# Patient Record
Sex: Male | Born: 1950 | Race: Black or African American | Hispanic: No | Smoking: Current every day smoker
Health system: Southern US, Community
[De-identification: ages and names within clinical notes are randomized; demographics above are authoritative.]

## PROBLEM LIST (undated history)

## (undated) DIAGNOSIS — D494 Neoplasm of unspecified behavior of bladder: Secondary | ICD-10-CM

## (undated) DIAGNOSIS — H269 Unspecified cataract: Secondary | ICD-10-CM

## (undated) DIAGNOSIS — S42009A Fracture of unspecified part of unspecified clavicle, initial encounter for closed fracture: Secondary | ICD-10-CM

## (undated) DIAGNOSIS — M1990 Unspecified osteoarthritis, unspecified site: Secondary | ICD-10-CM

## (undated) HISTORY — PX: MULTIPLE TOOTH EXTRACTIONS: SHX2053

---

## 2000-05-27 ENCOUNTER — Emergency Department (HOSPITAL_COMMUNITY): Admission: EM | Admit: 2000-05-27 | Discharge: 2000-05-27 | Payer: Self-pay | Admitting: Emergency Medicine

## 2000-05-28 ENCOUNTER — Encounter: Payer: Self-pay | Admitting: *Deleted

## 2006-10-10 ENCOUNTER — Emergency Department (HOSPITAL_COMMUNITY): Admission: EM | Admit: 2006-10-10 | Discharge: 2006-10-10 | Payer: Self-pay | Admitting: Emergency Medicine

## 2015-10-09 DIAGNOSIS — S42009A Fracture of unspecified part of unspecified clavicle, initial encounter for closed fracture: Secondary | ICD-10-CM

## 2015-10-09 HISTORY — DX: Fracture of unspecified part of unspecified clavicle, initial encounter for closed fracture: S42.009A

## 2016-10-06 ENCOUNTER — Encounter (HOSPITAL_COMMUNITY): Payer: Self-pay | Admitting: Nurse Practitioner

## 2016-10-06 DIAGNOSIS — F17210 Nicotine dependence, cigarettes, uncomplicated: Secondary | ICD-10-CM | POA: Diagnosis not present

## 2016-10-06 DIAGNOSIS — Z79899 Other long term (current) drug therapy: Secondary | ICD-10-CM | POA: Insufficient documentation

## 2016-10-06 DIAGNOSIS — N3090 Cystitis, unspecified without hematuria: Secondary | ICD-10-CM | POA: Insufficient documentation

## 2016-10-06 DIAGNOSIS — R935 Abnormal findings on diagnostic imaging of other abdominal regions, including retroperitoneum: Secondary | ICD-10-CM | POA: Insufficient documentation

## 2016-10-06 DIAGNOSIS — M545 Low back pain: Secondary | ICD-10-CM | POA: Diagnosis present

## 2016-10-06 LAB — URINALYSIS, ROUTINE W REFLEX MICROSCOPIC
Bilirubin Urine: NEGATIVE
Glucose, UA: NEGATIVE mg/dL
Ketones, ur: NEGATIVE mg/dL
Nitrite: NEGATIVE
Protein, ur: 100 mg/dL — AB
Specific Gravity, Urine: 1.015 (ref 1.005–1.030)
pH: 5 (ref 5.0–8.0)

## 2016-10-06 LAB — CBC
HCT: 45.3 % (ref 39.0–52.0)
Hemoglobin: 15.7 g/dL (ref 13.0–17.0)
MCH: 29.4 pg (ref 26.0–34.0)
MCHC: 34.7 g/dL (ref 30.0–36.0)
MCV: 84.8 fL (ref 78.0–100.0)
Platelets: 278 10*3/uL (ref 150–400)
RBC: 5.34 MIL/uL (ref 4.22–5.81)
RDW: 14.9 % (ref 11.5–15.5)
WBC: 13.7 10*3/uL — ABNORMAL HIGH (ref 4.0–10.5)

## 2016-10-06 LAB — BASIC METABOLIC PANEL
Anion gap: 9 (ref 5–15)
BUN: 21 mg/dL — ABNORMAL HIGH (ref 6–20)
CO2: 24 mmol/L (ref 22–32)
Calcium: 9.1 mg/dL (ref 8.9–10.3)
Chloride: 103 mmol/L (ref 101–111)
Creatinine, Ser: 0.97 mg/dL (ref 0.61–1.24)
GFR calc Af Amer: >60 mL/min (ref >60)
GFR calc non Af Amer: >60 mL/min (ref >60)
Glucose, Bld: 116 mg/dL — ABNORMAL HIGH (ref 65–99)
Potassium: 3.8 mmol/L (ref 3.5–5.1)
Sodium: 136 mmol/L (ref 135–145)

## 2016-10-06 NOTE — ED Triage Notes (Signed)
Pt states he noticed blood in his urine since this morning. States it hurts/aches when he urinates. Denies any medical hx, reports lower abd pain/warm feeling

## 2016-10-07 ENCOUNTER — Emergency Department (HOSPITAL_COMMUNITY): Payer: Medicare Other

## 2016-10-07 ENCOUNTER — Emergency Department (HOSPITAL_COMMUNITY)
Admission: EM | Admit: 2016-10-07 | Discharge: 2016-10-07 | Disposition: A | Discharge status: Home / Self Care | Payer: Medicare Other | Attending: Emergency Medicine | Admitting: Emergency Medicine

## 2016-10-07 DIAGNOSIS — N3091 Cystitis, unspecified with hematuria: Secondary | ICD-10-CM

## 2016-10-07 MED ORDER — IBUPROFEN 600 MG PO TABS: 600.0000 mg | ORAL_TABLET | Freq: Four times a day (QID) | ORAL | 0 refills | Status: DC | PRN

## 2016-10-07 MED ORDER — CIPROFLOXACIN HCL 500 MG PO TABS
500.0000 mg | ORAL_TABLET | Freq: Once | ORAL | Status: AC
  Administered 2016-10-07: 500 mg via ORAL
  Filled 2016-10-07: qty 1

## 2016-10-07 MED ORDER — NAPROXEN 500 MG PO TABS
500.0000 mg | ORAL_TABLET | Freq: Once | ORAL | Status: AC
  Administered 2016-10-07: 500 mg via ORAL
  Filled 2016-10-07: qty 1

## 2016-10-07 MED ORDER — CIPROFLOXACIN HCL 500 MG PO TABS: 500.0000 mg | ORAL_TABLET | Freq: Two times a day (BID) | ORAL | 0 refills | Status: DC

## 2016-10-07 MED ORDER — PHENAZOPYRIDINE HCL 200 MG PO TABS: 200.0000 mg | ORAL_TABLET | Freq: Three times a day (TID) | ORAL | 0 refills | Status: DC

## 2016-10-07 NOTE — Discharge Instructions (Signed)
Take ciprofloxacin as prescribed to treat your bladder infection. This will help your bloody urine resolve. Take Pyridium and/or ibuprofen as needed for pain. Follow-up with a primary care doctor to ensure that symptoms resolve.

## 2016-10-07 NOTE — ED Provider Notes (Signed)
[facility] DEPT Provider Note   By signing my name below, I, Bea Graff, attest that this documentation has been prepared under the direction and in the presence of [employer], PA-C. Electronically Signed: Bea Graff, ED Scribe. 10/07/16. 4:34 AM.    History   Chief Complaint Chief Complaint  Patient presents with  . Hematuria    The history is provided by the patient and medical records. No language interpreter was used.    HPI Comments:  Luis House is a 65 y.o. male who presents to the Emergency Department complaining of hematuria that began earlier today. He states he feels as if he cannot empty his bladder completely. He reports associated constant lower right sided back pain. He has not taken anything for pain. He denies modifying factors. He denies fever, chills, nausea, vomiting. He denies h/o nephrolithiasis. He denies anticoagulant use. He denies allergies to any antibiotics.    History reviewed. No pertinent past medical history.  There are no active problems to display for this patient.   History reviewed. No pertinent surgical history.     Home Medications    Prior to Admission medications   Medication Sig Start Date End Date Taking? Authorizing Provider  ciprofloxacin (CIPRO) 500 MG tablet Take 1 tablet (500 mg total) by mouth every 12 (twelve) hours. 10/07/16   Antonietta Breach, PA-C  ibuprofen (ADVIL,MOTRIN) 600 MG tablet Take 1 tablet (600 mg total) by mouth every 6 (six) hours as needed. 10/07/16   Antonietta Breach, PA-C  phenazopyridine (PYRIDIUM) 200 MG tablet Take 1 tablet (200 mg total) by mouth 3 (three) times daily. 10/07/16   Antonietta Breach, PA-C    Family History History reviewed. No pertinent family history.  Social History Social History  Substance Use Topics  . Smoking status: Current Every Day Smoker    Types: Cigarettes  . Smokeless tobacco: Never Used  . Alcohol use 2.4 oz/week    4 Cans of beer per week     Allergies     Patient has no known allergies.   Review of Systems Review of Systems A complete 10 system review of systems was obtained and all systems are negative except as noted in the HPI and PMH.    Physical Exam Updated Vital Signs BP (!) 172/105 (BP Location: Left Arm)   Pulse 78   Temp 98.2 F (36.8 C) (Oral)   Resp 18   Ht 5\' 7"  (1.702 m)   Wt 179 lb (81.2 kg)   SpO2 100%   BMI 28.04 kg/m   Physical Exam  Constitutional: He is oriented to person, place, and time. He appears well-developed and well-nourished. No distress.  Nontoxic and in no acute distress  HENT:  Head: Normocephalic and atraumatic.  Eyes: Conjunctivae and EOM are normal. No scleral icterus.  Neck: Normal range of motion.  Cardiovascular: Normal rate, regular rhythm and intact distal pulses.   Pulmonary/Chest: Effort normal. No respiratory distress. He has no wheezes.  Respirations even and unlabored  Abdominal: Soft. He exhibits no mass. There is no tenderness. There is no guarding.  Soft, nontender abdomen. No masses or peritoneal signs.  Musculoskeletal: Normal range of motion.  Neurological: He is alert and oriented to person, place, and time. He exhibits normal muscle tone. Coordination normal.  Skin: Skin is warm and dry. No rash noted. He is not diaphoretic. No erythema. No pallor.  Psychiatric: He has a normal mood and affect. His behavior is normal.  Nursing note and vitals reviewed.  ED Treatments / Results  DIAGNOSTIC STUDIES: Oxygen Saturation is 100% on RA, normal by my interpretation.   COORDINATION OF CARE: 4:34 AM- Will order imaging. Pt verbalizes understanding and agrees to plan.  Medications  ciprofloxacin (CIPRO) tablet 500 mg (500 mg Oral Given 10/07/16 0523)  naproxen (NAPROSYN) tablet 500 mg (500 mg Oral Given 10/07/16 0522)    Labs (all labs ordered are listed, but only abnormal results are displayed) Labs Reviewed  URINALYSIS, ROUTINE W REFLEX MICROSCOPIC - Abnormal;  Notable for the following:       Result Value   APPearance HAZY (*)    Hgb urine dipstick LARGE (*)    Protein, ur 100 (*)    Leukocytes, UA SMALL (*)    Bacteria, UA RARE (*)    Squamous Epithelial / LPF 0-5 (*)    All other components within normal limits  BASIC METABOLIC PANEL - Abnormal; Notable for the following:    Glucose, Bld 116 (*)    BUN 21 (*)    All other components within normal limits  CBC - Abnormal; Notable for the following:    WBC 13.7 (*)    All other components within normal limits  URINE CULTURE    EKG  EKG Interpretation None       Radiology Ct Renal Stone Study  Result Date: 10/07/2016 CLINICAL DATA:  Right flank pain and hematuria. EXAM: CT ABDOMEN AND PELVIS WITHOUT CONTRAST TECHNIQUE: Multidetector CT imaging of the abdomen and pelvis was performed following the standard protocol without IV contrast. COMPARISON:  None. FINDINGS: Lower chest: No consolidation or pleural fluid. Left diaphragmatic defect with Bochdalek hernia. Hepatobiliary: No focal liver abnormality is seen. No gallstones, gallbladder wall thickening, or biliary dilatation. Pancreas: No ductal dilatation or inflammation. Spleen: Normal in size without focal abnormality. Adrenals/Urinary Tract: No adrenal nodule. No renal stones or hydronephrosis. No significant perinephric edema. The ureters are decompressed without stones along the course. Small cyst in the lower left kidney is incompletely characterized without contrast. Urinary bladder is minimally distended, questionable bladder wall thickening. Stomach/Bowel: Small hiatal hernia. Stomach is physiologically distended. No bowel wall thickening or inflammation. Normal appendix. Moderate stool burden throughout the colon without colonic wall thickening. Vascular/Lymphatic: Aortic atherosclerosis. No enlarged abdominal or pelvic lymph nodes. Reproductive: Prostate is unremarkable. Other: Minimal fat within both inguinal canals. No free air,  free fluid, or intra-abdominal fluid collection. Musculoskeletal: There are no acute or suspicious osseous abnormalities. Large anterior osteophytes throughout the lumbar spine. Degenerative change of both hips, right greater than left. IMPRESSION: No renal stones or obstructive uropathy. Equivocal bladder wall thickening, can be seen with urinary tract infection/cystitis. Abdominal aortic atherosclerosis without aneurysm. Electronically Signed   By: Jeb Levering M.D.   On: 10/07/2016 05:42    Procedures Procedures (including critical care time)  Medications Ordered in ED Medications  ciprofloxacin (CIPRO) tablet 500 mg (500 mg Oral Given 10/07/16 0523)  naproxen (NAPROSYN) tablet 500 mg (500 mg Oral Given 10/07/16 0522)     Initial Impression / Assessment and Plan / ED Course  I have reviewed the triage vital signs and the nursing notes.  Pertinent labs & imaging results that were available during my care of the patient were reviewed by me and considered in my medical decision making (see chart for details).  Clinical Course     65 year old male presents to the emergency department for evaluation of hematuria with associated urinary urgency and frequency. He also reports sensation of incomplete bladder emptying.  No evidence of kidney stone on CT scan. Findings consistent with hemorrhagic cystitis. Urine sent for culture. Will start on ciprofloxacin. Patient advised to follow-up with a primary care doctor to ensure resolution of symptoms. Return precautions discussed and provided. Patient discharged in stable condition.   Final Clinical Impressions(s) / ED Diagnoses   Final diagnoses:  Hemorrhagic cystitis    New Prescriptions New Prescriptions   CIPROFLOXACIN (CIPRO) 500 MG TABLET    Take 1 tablet (500 mg total) by mouth every 12 (twelve) hours.   IBUPROFEN (ADVIL,MOTRIN) 600 MG TABLET    Take 1 tablet (600 mg total) by mouth every 6 (six) hours as needed.   PHENAZOPYRIDINE  (PYRIDIUM) 200 MG TABLET    Take 1 tablet (200 mg total) by mouth 3 (three) times daily.    I personally performed the services described in this documentation, which was scribed in my presence. The recorded information has been reviewed and is accurate.      Antonietta Breach, PA-C 10/07/16 [id number]    Everlene Balls, MD 10/07/16 [phone]

## 2016-10-08 LAB — URINE CULTURE: Culture: NO GROWTH

## 2017-09-23 ENCOUNTER — Emergency Department (HOSPITAL_COMMUNITY)
Admission: EM | Admit: 2017-09-23 | Discharge: 2017-09-23 | Disposition: A | Discharge status: Home / Self Care | Payer: Medicare (Managed Care) | Attending: Emergency Medicine | Admitting: Emergency Medicine

## 2017-09-23 ENCOUNTER — Encounter (HOSPITAL_COMMUNITY): Payer: Self-pay

## 2017-09-23 ENCOUNTER — Emergency Department (HOSPITAL_COMMUNITY): Payer: Medicare (Managed Care)

## 2017-09-23 DIAGNOSIS — M25561 Pain in right knee: Secondary | ICD-10-CM | POA: Diagnosis present

## 2017-09-23 DIAGNOSIS — M25461 Effusion, right knee: Secondary | ICD-10-CM | POA: Diagnosis not present

## 2017-09-23 DIAGNOSIS — F17210 Nicotine dependence, cigarettes, uncomplicated: Secondary | ICD-10-CM | POA: Insufficient documentation

## 2017-09-23 DIAGNOSIS — Z79899 Other long term (current) drug therapy: Secondary | ICD-10-CM | POA: Diagnosis not present

## 2017-09-23 MED ORDER — NAPROXEN 500 MG PO TABS: 500.0000 mg | ORAL_TABLET | Freq: Two times a day (BID) | ORAL | 0 refills | Status: DC

## 2017-09-23 NOTE — Discharge Instructions (Signed)
Take naproxen 2 times a day with meals.  Do not take other anti-inflammatories at the same time open (Advil, Motrin, ibuprofen, Aleve). You may supplement with Tylenol if you need further pain control. Wear the knee sleeve when walking and during the day for comfort. Elevate your leg whenever possible. Use ice packs to help control your pain. Use the ice 20 minutes at a time 4 times a day.  Follow-up with orthopedic doctor for further evaluation and management of your knee. Return to the emergency room if you develop fevers, knee becomes very red, numbness of your foot, or any new or worsening symptoms.

## 2017-09-23 NOTE — ED Triage Notes (Signed)
Pt states that yesterday began to have pain and swelling. Denies injury or fevers.

## 2017-09-23 NOTE — ED Notes (Signed)
Pt departed in NAD.  

## 2017-09-24 NOTE — ED Provider Notes (Signed)
[facility] EMERGENCY DEPARTMENT Provider Note   CSN: [id number] Arrival date & time: 09/23/17  1949     History   Chief Complaint Chief Complaint  Patient presents with  . Knee Pain    HPI Luis House is a 66 y.o. male presenting for evaluation of right knee pain.  Patient states he has been having issues with this knee for several months.  Usually when he has pain, it resolves in 30 minutes.  This morning, his pain did not resolve.  Patient reports pain of the lateral aspect of the right knee, worse with palpation and movement.  He has not tried anything for his pain.  Nothing makes it better.  He denies numbness or tingling.  He denies fall, trauma, or injury.  He is not on blood thinners.  Pain does not radiate.  It is described as a throbbing.   HPI  History reviewed. No pertinent past medical history.  There are no active problems to display for this patient.   History reviewed. No pertinent surgical history.     Home Medications    Prior to Admission medications   Medication Sig Start Date End Date Taking? Authorizing Provider  ciprofloxacin (CIPRO) 500 MG tablet Take 1 tablet (500 mg total) by mouth every 12 (twelve) hours. 10/07/16   Antonietta Breach, PA-C  ibuprofen (ADVIL,MOTRIN) 600 MG tablet Take 1 tablet (600 mg total) by mouth every 6 (six) hours as needed. 10/07/16   Antonietta Breach, PA-C  naproxen (NAPROSYN) 500 MG tablet Take 1 tablet (500 mg total) by mouth 2 (two) times daily with a meal. 09/23/17   Zaki Gertsch, PA-C  phenazopyridine (PYRIDIUM) 200 MG tablet Take 1 tablet (200 mg total) by mouth 3 (three) times daily. 10/07/16   Antonietta Breach, PA-C    Family History No family history on file.  Social History Social History   Tobacco Use  . Smoking status: Current Every Day Smoker    Types: Cigarettes  . Smokeless tobacco: Never Used  Substance Use Topics  . Alcohol use: Yes    Alcohol/week: 2.4 oz    Types: 4 Cans of beer  per week  . Drug use: Not on file     Allergies   Patient has no known allergies.   Review of Systems Review of Systems  Musculoskeletal: Positive for arthralgias and joint swelling.  Neurological: Negative for numbness.  Hematological: Does not bruise/bleed easily.     Physical Exam Updated Vital Signs BP (!) 156/89 (BP Location: Right Arm)   Pulse 79   Temp 98.8 F (37.1 C) (Oral)   Resp 16   SpO2 100%   Physical Exam  Constitutional: He is oriented to person, place, and time. He appears well-developed and well-nourished. No distress.  HENT:  Head: Normocephalic and atraumatic.  Eyes: EOM are normal.  Neck: Normal range of motion.  Pulmonary/Chest: Effort normal.  Abdominal: He exhibits no distension.  Musculoskeletal: He exhibits edema and tenderness.  Mild swelling of the right knee.  Tenderness palpation of lateral aspect.  No obvious laceration, contusion, or injury.  No crepitus or deformity.  Popliteal pulses intact bilaterally.  Sensation intact bilaterally.  Strength intact bilaterally.  Patient is ambulatory.  Soft compartments.  Neurological: He is alert and oriented to person, place, and time. No sensory deficit.  Skin: Skin is warm. No rash noted.  Psychiatric: He has a normal mood and affect.  Nursing note and vitals reviewed.    ED Treatments / Results  Labs (all labs ordered are listed, but only abnormal results are displayed) Labs Reviewed - No data to display  EKG  EKG Interpretation None       Radiology Dg Knee Complete 4 Views Right  Result Date: 09/23/2017 CLINICAL DATA:  Knee pain EXAM: RIGHT KNEE - COMPLETE 4+ VIEW COMPARISON:  None. FINDINGS: No fracture or dislocation is evident. Moderate patellofemoral degenerative change with narrowing and prominent osteophyte. Moderate to marked arthritis of the medial compartment of the knee with subchondral sclerosis and spurring. Mild degenerative changes of the lateral compartment. Knee  fusion. Large oval coarse calcifications superior to the patella. IMPRESSION: 1. No acute osseous abnormality 2. 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 3. 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 Electronically Signed   By: Donavan Foil M.D.   On: 09/23/2017 21:27    Procedures Procedures (including critical care time)  Medications Ordered in ED Medications - No data to display   Initial Impression / Assessment and Plan / ED Course  I have reviewed the triage vital signs and the nursing notes.  Pertinent labs & imaging results that were available during my care of the patient were reviewed by me and considered in my medical decision making (see chart for details).     Pt presenting for evaluation of right knee pain and swelling.  Physical exam reassuring, patient is neurovascularly intact.  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  Will apply knee sleeve, give instructions on NSAID use, and follow-up with orthopedics.  At this time, patient appears safe for discharge.  Return precautions given.  Patient states he understands and agrees to plan.   Final Clinical Impressions(s) / ED Diagnoses   Final diagnoses:  Effusion of right knee  Acute pain of right knee    ED Discharge Orders        Ordered    naproxen (NAPROSYN) 500 MG tablet  2 times daily with meals     09/23/17 2304       Seletha Zimmermann, PA-C 09/24/17 0106    Deno Etienne, DO 09/24/17 1459

## 2018-11-22 ENCOUNTER — Other Ambulatory Visit: Payer: Self-pay

## 2018-11-22 ENCOUNTER — Encounter (HOSPITAL_COMMUNITY): Payer: Self-pay | Admitting: Emergency Medicine

## 2018-11-22 ENCOUNTER — Emergency Department (HOSPITAL_COMMUNITY)
Admission: EM | Admit: 2018-11-22 | Discharge: 2018-11-22 | Disposition: A | Discharge status: Home / Self Care | Payer: Medicare (Managed Care) | Attending: Emergency Medicine | Admitting: Emergency Medicine

## 2018-11-22 DIAGNOSIS — R319 Hematuria, unspecified: Secondary | ICD-10-CM | POA: Insufficient documentation

## 2018-11-22 DIAGNOSIS — F17210 Nicotine dependence, cigarettes, uncomplicated: Secondary | ICD-10-CM | POA: Diagnosis not present

## 2018-11-22 LAB — URINALYSIS, ROUTINE W REFLEX MICROSCOPIC
Bilirubin Urine: NEGATIVE
GLUCOSE, UA: NEGATIVE mg/dL
Ketones, ur: NEGATIVE mg/dL
Nitrite: NEGATIVE
PH: 7 (ref 5.0–8.0)
PROTEIN: NEGATIVE mg/dL
Specific Gravity, Urine: 1.011 (ref 1.005–1.030)

## 2018-11-22 NOTE — ED Provider Notes (Signed)
[facility] EMERGENCY DEPARTMENT Provider Note   CSN: [id number] Arrival date & time: 11/22/18  1214     History   Chief Complaint Chief Complaint  Patient presents with  . Hematuria    HPI Barlow Luis House is a 68 y.o. male.  The history is provided by the patient.  Hematuria  This is a new problem. The current episode started yesterday. The problem occurs constantly. The problem has been gradually improving. Pertinent negatives include no chest pain, no abdominal pain, no headaches and no shortness of breath. Nothing aggravates the symptoms. Nothing relieves the symptoms. He has tried nothing for the symptoms. The treatment provided no relief.    History reviewed. No pertinent past medical history.  There are no active problems to display for this patient.   History reviewed. No pertinent surgical history.      Home Medications    Prior to Admission medications   Medication Sig Start Date End Date Taking? Authorizing Provider  ciprofloxacin (CIPRO) 500 MG tablet Take 1 tablet (500 mg total) by mouth every 12 (twelve) hours. Patient not taking: Reported on 11/22/2018 10/07/16   Antonietta Breach, PA-C  ibuprofen (ADVIL,MOTRIN) 600 MG tablet Take 1 tablet (600 mg total) by mouth every 6 (six) hours as needed. Patient not taking: Reported on 11/22/2018 10/07/16   Antonietta Breach, PA-C  naproxen (NAPROSYN) 500 MG tablet Take 1 tablet (500 mg total) by mouth 2 (two) times daily with a meal. Patient not taking: Reported on 11/22/2018 09/23/17   Caccavale, Sophia, PA-C  phenazopyridine (PYRIDIUM) 200 MG tablet Take 1 tablet (200 mg total) by mouth 3 (three) times daily. Patient not taking: Reported on 11/22/2018 10/07/16   Antonietta Breach, PA-C    Family History No family history on file.  Social History Social History   Tobacco Use  . Smoking status: Current Every Day Smoker    Types: Cigarettes  . Smokeless tobacco: Never Used  Substance Use Topics  . Alcohol  use: Yes    Alcohol/week: 4.0 standard drinks    Types: 4 Cans of beer per week  . Drug use: Not on file     Allergies   Patient has no known allergies.   Review of Systems Review of Systems  Constitutional: Negative for chills and fever.  HENT: Negative for ear pain and sore throat.   Eyes: Negative for pain and visual disturbance.  Respiratory: Negative for cough and shortness of breath.   Cardiovascular: Negative for chest pain and palpitations.  Gastrointestinal: Negative for abdominal pain and vomiting.  Genitourinary: Positive for hematuria. Negative for decreased urine volume, difficulty urinating, discharge, dysuria, enuresis, flank pain, frequency, genital sores, penile pain, penile swelling, scrotal swelling, testicular pain and urgency.  Musculoskeletal: Negative for arthralgias and back pain.  Skin: Negative for color change and rash.  Neurological: Negative for seizures, syncope and headaches.  All other systems reviewed and are negative.    Physical Exam Updated Vital Signs  ED Triage Vitals  Enc Vitals Group     BP 11/22/18 1228 (!) 138/111     Pulse Rate 11/22/18 1228 78     Resp 11/22/18 1228 12     Temp 11/22/18 1228 98.7 F (37.1 C)     Temp Source 11/22/18 1228 Oral     SpO2 11/22/18 1228 98 %     Weight 11/22/18 1225 179 lb (81.2 kg)     Height 11/22/18 1225 5\' 7"  (1.702 m)     Head Circumference --  Peak Flow --      Pain Score 11/22/18 1225 9     Pain Loc --      Pain Edu? --      Excl. in [place]? --     Physical Exam Vitals signs and nursing note reviewed.  Constitutional:      Appearance: He is well-developed.  HENT:     Head: Normocephalic and atraumatic.     Mouth/Throat:     Mouth: Mucous membranes are moist.  Eyes:     Extraocular Movements: Extraocular movements intact.     Conjunctiva/sclera: Conjunctivae normal.     Pupils: Pupils are equal, round, and reactive to light.  Neck:     Musculoskeletal: Neck supple.    Cardiovascular:     Rate and Rhythm: Normal rate and regular rhythm.     Pulses: Normal pulses.     Heart sounds: Normal heart sounds. No murmur.  Pulmonary:     Effort: Pulmonary effort is normal. No respiratory distress.     Breath sounds: Normal breath sounds.  Abdominal:     General: Abdomen is flat. There is no distension.     Palpations: Abdomen is soft. There is no mass.     Tenderness: There is no abdominal tenderness. There is no right CVA tenderness, left CVA tenderness, guarding or rebound.     Hernia: No hernia is present.  Skin:    General: Skin is warm and dry.     Capillary Refill: Capillary refill takes less than 2 seconds.  Neurological:     Mental Status: He is alert.      ED Treatments / Results  Labs (all labs ordered are listed, but only abnormal results are displayed) Labs Reviewed  URINALYSIS, ROUTINE W REFLEX MICROSCOPIC - Abnormal; Notable for the following components:      Result Value   Hgb urine dipstick LARGE (*)    Leukocytes,Ua TRACE (*)    RBC / HPF >50 (*)    Bacteria, UA RARE (*)    Non Squamous Epithelial 0-5 (*)    All other components within normal limits  URINE CULTURE    EKG None  Radiology No results found.  Procedures Procedures (including critical care time)  Medications Ordered in ED Medications - No data to display   Initial Impression / Assessment and Plan / ED Course  I have reviewed the triage vital signs and the nursing notes.  Pertinent labs & imaging results that were available during my care of the patient were reviewed by me and considered in my medical decision making (see chart for details).     Luis House is a 68 year old male with no significant medical history who presents to the ED with hematuria.  Patient with overall normal vitals.  No fever.  Patient is not on blood thinners.  Patient with blood-tinged urine over the last 2 days.  Does not have any abdominal pain.  No history of kidney stones.   Was initially passing some clots but has now resolved.  Patient had bladder scan that showed less than 50 cc of urine.  He states that he feels like he is completely voiding.  Urinalysis showed no signs of infection.  Mostly contamination and will send for culture.  We will hold off on any treatment at this time.  Given painless hematuria and smoking history recommend that he follow-up with urology.  States that this has occurred before in the past but he never followed up with urology.  Given  return precautions and recommend that he follow-up with his primary care doctor to have them follow-up urine culture.  Patient given information for urology follow-up and given return precautions including if he is unable to void to return for possible Foley catheter.  This chart was dictated using voice recognition software.  Despite best efforts to proofread,  errors can occur which can change the documentation meaning.    Final Clinical Impressions(s) / ED Diagnoses   Final diagnoses:  Hematuria, unspecified type    ED Discharge Orders    None       Lennice Sites, DO 11/22/18 1722

## 2018-11-22 NOTE — ED Triage Notes (Signed)
Pt has been having burning with urination x 2 days and hematuria. Denies fevers or other symptoms

## 2018-11-22 NOTE — Discharge Instructions (Addendum)
Return to ED if unable to void

## 2018-11-23 LAB — URINE CULTURE: CULTURE: NO GROWTH

## 2018-12-01 ENCOUNTER — Emergency Department (HOSPITAL_COMMUNITY)
Admission: EM | Admit: 2018-12-01 | Discharge: 2018-12-01 | Disposition: A | Discharge status: Home / Self Care | Payer: Medicare (Managed Care) | Attending: Emergency Medicine | Admitting: Emergency Medicine

## 2018-12-01 ENCOUNTER — Encounter (HOSPITAL_COMMUNITY): Payer: Self-pay

## 2018-12-01 ENCOUNTER — Emergency Department (HOSPITAL_COMMUNITY): Payer: Medicare (Managed Care)

## 2018-12-01 ENCOUNTER — Other Ambulatory Visit: Payer: Self-pay

## 2018-12-01 DIAGNOSIS — R310 Gross hematuria: Secondary | ICD-10-CM | POA: Insufficient documentation

## 2018-12-01 DIAGNOSIS — R319 Hematuria, unspecified: Secondary | ICD-10-CM | POA: Diagnosis present

## 2018-12-01 DIAGNOSIS — F17210 Nicotine dependence, cigarettes, uncomplicated: Secondary | ICD-10-CM | POA: Insufficient documentation

## 2018-12-01 LAB — URINALYSIS, ROUTINE W REFLEX MICROSCOPIC
BACTERIA UA: NONE SEEN
Bilirubin Urine: NEGATIVE
Glucose, UA: NEGATIVE mg/dL
Ketones, ur: NEGATIVE mg/dL
Nitrite: NEGATIVE
PROTEIN: 30 mg/dL — AB
RBC / HPF: >50 RBC/hpf — ABNORMAL HIGH (ref 0–5)
SPECIFIC GRAVITY, URINE: 1.016 (ref 1.005–1.030)
pH: 6 (ref 5.0–8.0)

## 2018-12-01 LAB — CBC WITH DIFFERENTIAL/PLATELET
ABS IMMATURE GRANULOCYTES: 0.08 10*3/uL — AB (ref 0.00–0.07)
BASOS ABS: 0.1 10*3/uL (ref 0.0–0.1)
Basophils Relative: 1 %
Eosinophils Absolute: 0.2 10*3/uL (ref 0.0–0.5)
Eosinophils Relative: 1 %
HCT: 47.8 % (ref 39.0–52.0)
HEMOGLOBIN: 14.9 g/dL (ref 13.0–17.0)
Immature Granulocytes: 1 %
LYMPHS PCT: 22 %
Lymphs Abs: 2.7 10*3/uL (ref 0.7–4.0)
MCH: 28.3 pg (ref 26.0–34.0)
MCHC: 31.2 g/dL (ref 30.0–36.0)
MCV: 90.9 fL (ref 80.0–100.0)
Monocytes Absolute: 1 10*3/uL (ref 0.1–1.0)
Monocytes Relative: 8 %
NRBC: 0 % (ref 0.0–0.2)
Neutro Abs: 8.2 10*3/uL — ABNORMAL HIGH (ref 1.7–7.7)
Neutrophils Relative %: 67 %
Platelets: 288 10*3/uL (ref 150–400)
RBC: 5.26 MIL/uL (ref 4.22–5.81)
RDW: 13.8 % (ref 11.5–15.5)
WBC: 12.2 10*3/uL — AB (ref 4.0–10.5)

## 2018-12-01 LAB — BASIC METABOLIC PANEL
Anion gap: 5 (ref 5–15)
BUN: 19 mg/dL (ref 8–23)
CHLORIDE: 109 mmol/L (ref 98–111)
CO2: 27 mmol/L (ref 22–32)
Calcium: 8.9 mg/dL (ref 8.9–10.3)
Creatinine, Ser: 1.2 mg/dL (ref 0.61–1.24)
GFR calc non Af Amer: >60 mL/min (ref >60)
Glucose, Bld: 87 mg/dL (ref 70–99)
POTASSIUM: 5.3 mmol/L — AB (ref 3.5–5.1)
SODIUM: 141 mmol/L (ref 135–145)

## 2018-12-01 NOTE — Discharge Instructions (Addendum)
Follow up with Urology to further workup of your hematuria (blood in urine). Return to ER for worsening or concerning symptoms. A urine culture was sent out today to check for infection in your urine.

## 2018-12-01 NOTE — ED Triage Notes (Signed)
Pt states that he has had blood in his urine x 2 days. Pt had similar issue 11/22/18. Pt also c/o lower abd pain.

## 2018-12-01 NOTE — ED Provider Notes (Signed)
[facility] DEPT Provider Note   CSN: [id number] Arrival date & time: 12/01/18  1146    History   Chief Complaint Chief Complaint  Patient presents with  . Hematuria    HPI [facility] is a 68 y.o. male.     68 year old male presents with complaint of blood in his urine, described as pink-tinged urine intermittent x4 days.  Patient states he was seen in the emergency room 9 days ago for same and symptoms resolved and now have returned.  Patient reports he was also seen for this 2 years ago and symptoms resolved with antibiotics.  Patient reports painful and frequent urination as well as diffuse mild abdominal aching.  Denies nausea, vomiting, changes in bowel habits, denies testicular pain or swelling.  Patient is not on blood thinners.  No other complaints or concerns.     History reviewed. No pertinent past medical history.  There are no active problems to display for this patient.   History reviewed. No pertinent surgical history.      Home Medications    Prior to Admission medications   Not on File    Family History No family history on file.  Social History Social History   Tobacco Use  . Smoking status: Current Every Day Smoker    Types: Cigarettes  . Smokeless tobacco: Never Used  Substance Use Topics  . Alcohol use: Yes    Alcohol/week: 4.0 standard drinks    Types: 4 Cans of beer per week  . Drug use: Not on file     Allergies   Patient has no known allergies.   Review of Systems Review of Systems  Constitutional: Negative for chills and fever.  Respiratory: Negative for shortness of breath.   Cardiovascular: Negative for chest pain.  Gastrointestinal: Positive for abdominal pain. Negative for blood in stool, constipation, diarrhea, nausea and vomiting.  Genitourinary: Positive for frequency and hematuria. Negative for difficulty urinating, scrotal swelling and testicular pain.  Musculoskeletal: Negative for  arthralgias and myalgias.  Skin: Negative for rash and wound.  Allergic/Immunologic: Negative for immunocompromised state.  Hematological: Does not bruise/bleed easily.  Psychiatric/Behavioral: Negative for confusion.  All other systems reviewed and are negative.    Physical Exam Updated Vital Signs BP (!) 161/98   Pulse 76   Temp 98.2 F (36.8 C) (Oral)   Resp 18   Ht 5\' 7"  (1.702 m)   Wt 81.2 kg   SpO2 96%   BMI 28.04 kg/m   Physical Exam Vitals signs and nursing note reviewed.  Constitutional:      General: He is not in acute distress.    Appearance: He is well-developed. He is not diaphoretic.  HENT:     Head: Normocephalic and atraumatic.  Cardiovascular:     Rate and Rhythm: Normal rate and regular rhythm.     Pulses: Normal pulses.     Heart sounds: Normal heart sounds. No friction rub.  Pulmonary:     Effort: Pulmonary effort is normal.     Breath sounds: Normal breath sounds.  Abdominal:     General: Abdomen is flat.     Tenderness: There is generalized abdominal tenderness. There is no right CVA tenderness or left CVA tenderness.  Skin:    General: Skin is warm and dry.     Findings: No erythema or rash.  Neurological:     Mental Status: He is alert and oriented to person, place, and time.  Psychiatric:  Behavior: Behavior normal.      ED Treatments / Results  Labs (all labs ordered are listed, but only abnormal results are displayed) Labs Reviewed  URINALYSIS, ROUTINE W REFLEX MICROSCOPIC - Abnormal; Notable for the following components:      Result Value   APPearance HAZY (*)    Hgb urine dipstick LARGE (*)    Protein, ur 30 (*)    Leukocytes,Ua TRACE (*)    RBC / HPF >50 (*)    All other components within normal limits  CBC WITH DIFFERENTIAL/PLATELET - Abnormal; Notable for the following components:   WBC 12.2 (*)    Neutro Abs 8.2 (*)    Abs Immature Granulocytes 0.08 (*)    All other components within normal limits  BASIC  METABOLIC PANEL - Abnormal; Notable for the following components:   Potassium 5.3 (*)    All other components within normal limits  URINE CULTURE    EKG None  Radiology Ct Renal Stone Study  Result Date: 12/01/2018 CLINICAL DATA:  Blood in urine for 2 days. Bilateral lower abdominal pain. EXAM: CT ABDOMEN AND PELVIS WITHOUT CONTRAST TECHNIQUE: Multidetector CT imaging of the abdomen and pelvis was performed following the standard protocol without IV contrast. COMPARISON:  CT abdomen dated 10/07/2016. FINDINGS: Lower chest: No acute findings. Hepatobiliary: No focal liver abnormality is seen. No gallstones, gallbladder wall thickening, or biliary dilatation. Pancreas: Unremarkable. No pancreatic ductal dilatation or surrounding inflammatory changes. Spleen: Normal in size without focal abnormality. Adrenals/Urinary Tract: Adrenal glands appear normal. Kidneys are unremarkable without mass, stone or hydronephrosis. No ureteral or bladder calculi identified. Bladder is decompressed limiting characterization of its walls. Stomach/Bowel: No dilated large or small bowel loops. No evidence of bowel wall inflammation. Appendix is normal. Stomach is unremarkable, partially decompressed. Vascular/Lymphatic: Aortic atherosclerosis. No enlarged lymph nodes seen in the abdomen or pelvis. Reproductive: Prostate is unremarkable. Other: No free fluid or abscess collection. No free intraperitoneal air. Musculoskeletal: No acute or suspicious osseous finding. Degenerative change at the bilateral hip joints, RIGHT greater than LEFT, at least moderate in degree. Additional degenerative changes throughout the thoracolumbar spine, moderate in degree. IMPRESSION: 1. No acute findings within the abdomen or pelvis. No renal or ureteral calculi. No bowel obstruction or evidence of bowel wall inflammation. Appendix is normal. 2. Bladder is decompressed limiting characterization. 3. Degenerative changes of the bilateral hip joints  and spine, as detailed above. Aortic Atherosclerosis (ICD10-I70.0). Electronically Signed   By: Franki Cabot M.D.   On: 12/01/2018 16:25    Procedures Procedures (including critical care time)  Medications Ordered in ED Medications - No data to display   Initial Impression / Assessment and Plan / ED Course  I have reviewed the triage vital signs and the nursing notes.  Pertinent labs & imaging results that were available during my care of the patient were reviewed by me and considered in my medical decision making (see chart for details).  Clinical Course as of Dec 01 1704  Mon Dec 01, 2860  [id number] 68 year old male presents with complaint of blood in his urine described as pink appearing urine with urinary frequency and abdominal pain.  Patient was seen in the emergency room 9 days ago with hematuria with a negative culture and states his symptoms had resolved and now are back.  Patient was scheduled to see urology however missed his appointment and needs to reschedule.  On exam patient has mild generalized abdominal tenderness, is otherwise well-appearing.  Urine shows large hemoglobin, protein,  trace leukocytes with greater than 50 red blood cells and 12-50 white blood cells.  Urinalysis appears similar to urinalysis from 9 days ago which did not have any growth on culture.  Urine will be sent for culture again, advised patient to follow-up with urology for further evaluation of hematuria which may be related to bladder cancer which would require cystoscopy which cannot be done in the emergency room.  Patient verbalizes understanding of his discharge instructions and plan.   [LM]  1706 CT completed due to report of abdominal pain and does not show source for patient's hematuria today.  BMP with potassium of 5.3 however specimen is hemolyzed.  CBC with white count 12,000.   [LM]    Clinical Course User Index [LM] Tacy Learn, PA-C   Final Clinical Impressions(s) / ED Diagnoses   Final  diagnoses:  Gross hematuria    ED Discharge Orders    None       Roque Lias 12/01/18 1706    Hayden Rasmussen, MD 12/02/18 1050

## 2018-12-03 LAB — URINE CULTURE: Culture: NO GROWTH

## 2019-01-05 ENCOUNTER — Other Ambulatory Visit: Payer: Self-pay | Admitting: Urology

## 2019-01-05 MED ORDER — GEMCITABINE CHEMO FOR BLADDER INSTILLATION 2000 MG: 2000.0000 mg | Freq: Once | INTRAVENOUS | Status: DC

## 2019-01-05 NOTE — Patient Instructions (Addendum)
[facility]    Your procedure is scheduled on:01/09/2019    Report to [facility] Main  House  Report to admitting at 7:00 AM    Call this number if you have problems the morning of surgery [phone]    Remember: Do not eat food or drink liquids :After Midnight. BRUSH YOUR TEETH MORNING OF SURGERY AND RINSE YOUR MOUTH OUT, NO CHEWING GUM CANDY OR MINTS.     Take these medicines the morning of surgery with A SIP OF WATER: none                                You may not have any metal on your body              piercings  Do not wear jewelry,, lotions, powders or , deodorant             Do not shave  48 hours prior to surgery.              Men may shave face and neck.   Do not bring valuables to the hospital. [facility].  Contacts, dentures or bridgework may not be worn into surgery.     Patients discharged the day of surgery will not be allowed to drive home.   IF YOU ARE HAVING SURGERY AND GOING HOME THE SAME DAY, YOU MUST HAVE AN ADULT TO DRIVE YOU HOME AND BE WITH YOU FOR 24 HOURS.  YOU MAY GO HOME BY TAXI OR UBER OR ORTHERWISE, BUT AN ADULT MUST ACCOMPANY YOU HOME AND STAY WITH YOU FOR 24 HOURS.  Name and phone number of your driver:[facility] daughter cell [phone]  Special Instructions: N/A              Please read over the following fact sheets you were given: _____________________________________________________________________             [facility] - Preparing for Surgery Before surgery, you can play an important role.   Because skin is not sterile, your skin needs to be as free of germs as possible.  You can reduce the number of germs on your skin by washing with CHG (chlorahexidine gluconate) soap before surgery.  CHG is an antiseptic cleaner which kills germs and bonds with the skin to continue killing germs even after washing. Please DO NOT use if you have an allergy to CHG  or antibacterial soaps.  If your skin becomes reddened/irritated stop using the CHG and inform your nurse when you arrive at Short Stay.  You may shave your face/neck. Please follow these instructions carefully:  1.  Shower with CHG Soap the night before surgery and the  morning of Surgery.  2.  If you choose to wash your hair, wash your hair first as usual with your  normal  shampoo.  3.  After you shampoo, rinse your hair and body thoroughly to remove the  shampoo.                        4.  Use CHG as you would any other liquid soap.  You can apply chg directly  to the skin and wash  Gently with a scrungie or clean washcloth.  5.  Apply the CHG Soap to your body ONLY FROM THE NECK DOWN.   Do not use on face/ open                           Wound or open sores. Avoid contact with eyes, ears mouth and genitals (private parts).                       Wash face,  Genitals (private parts) with your normal soap.             6.  Wash thoroughly, paying special attention to the area where your surgery  will be performed.  7.  Thoroughly rinse your body with warm water from the neck down.  8.  DO NOT shower/wash with your normal soap after using and rinsing off  the CHG Soap.                9.  Pat yourself dry with a clean towel.            10.  Wear clean pajamas.            11.  Place clean sheets on your bed the night of your first shower and do not  sleep with pets. Day of Surgery : Do not apply any lotions/deodorants the morning of surgery.  Please wear clean clothes to the hospital/surgery center.  FAILURE TO FOLLOW THESE INSTRUCTIONS MAY RESULT IN THE CANCELLATION OF YOUR SURGERY PATIENT SIGNATURE_________________________________  NURSE SIGNATURE__________________________________  ________________________________________________________________________

## 2019-01-05 NOTE — Progress Notes (Signed)
SPOKE W/  _     SCREENING SYMPTOMS OF COVID 19:   COUGH--  RUNNY NOSE---   SORE THROAT---  SHORTNESS OF BREATH---  DIFFICULTY BREATHING---  TEMP >100.4-----  HAVE YOU OR ANY FAMILY MEMBER TRAVELLED PAST 14 DAYS OUT OF THE   COUNTY--- STATE---- COUNTRY----  HAVE YOU OR ANY FAMILY MEMBER BEEN EXPOSED TO ANYONE WITH COVID 19?    No voicemail [phone]

## 2019-01-06 ENCOUNTER — Encounter (HOSPITAL_COMMUNITY): Payer: Self-pay

## 2019-01-06 ENCOUNTER — Other Ambulatory Visit: Payer: Self-pay

## 2019-01-06 ENCOUNTER — Encounter (HOSPITAL_COMMUNITY)
Admission: RE | Admit: 2019-01-06 | Discharge: 2019-01-06 | Disposition: A | Discharge status: Home / Self Care | Payer: Medicare (Managed Care) | Source: Ambulatory Visit | Attending: Urology | Admitting: Urology

## 2019-01-06 DIAGNOSIS — Z01812 Encounter for preprocedural laboratory examination: Secondary | ICD-10-CM | POA: Diagnosis not present

## 2019-01-06 DIAGNOSIS — C679 Malignant neoplasm of bladder, unspecified: Secondary | ICD-10-CM | POA: Insufficient documentation

## 2019-01-06 HISTORY — DX: Unspecified osteoarthritis, unspecified site: M19.90

## 2019-01-06 HISTORY — DX: Fracture of unspecified part of unspecified clavicle, initial encounter for closed fracture: S42.009A

## 2019-01-06 HISTORY — DX: Unspecified cataract: H26.9

## 2019-01-06 HISTORY — DX: Neoplasm of unspecified behavior of bladder: D49.4

## 2019-01-06 LAB — CBC
HCT: 46.8 % (ref 39.0–52.0)
Hemoglobin: 14.5 g/dL (ref 13.0–17.0)
MCH: 27.8 pg (ref 26.0–34.0)
MCHC: 31 g/dL (ref 30.0–36.0)
MCV: 89.8 fL (ref 80.0–100.0)
NRBC: 0 % (ref 0.0–0.2)
Platelets: 278 10*3/uL (ref 150–400)
RBC: 5.21 MIL/uL (ref 4.22–5.81)
RDW: 13.9 % (ref 11.5–15.5)
WBC: 10.1 10*3/uL (ref 4.0–10.5)

## 2019-01-06 LAB — COMPREHENSIVE METABOLIC PANEL
ALT: 13 U/L (ref 0–44)
AST: 19 U/L (ref 15–41)
Albumin: 4.1 g/dL (ref 3.5–5.0)
Alkaline Phosphatase: 66 U/L (ref 38–126)
Anion gap: 6 (ref 5–15)
BUN: 23 mg/dL (ref 8–23)
CO2: 24 mmol/L (ref 22–32)
Calcium: 8.8 mg/dL — ABNORMAL LOW (ref 8.9–10.3)
Chloride: 109 mmol/L (ref 98–111)
Creatinine, Ser: 0.99 mg/dL (ref 0.61–1.24)
GFR calc non Af Amer: >60 mL/min (ref >60)
Glucose, Bld: 105 mg/dL — ABNORMAL HIGH (ref 70–99)
Potassium: 4.5 mmol/L (ref 3.5–5.1)
Sodium: 139 mmol/L (ref 135–145)
Total Bilirubin: 0.8 mg/dL (ref 0.3–1.2)
Total Protein: 7.9 g/dL (ref 6.5–8.1)

## 2019-01-07 NOTE — Progress Notes (Signed)
Anesthesia Chart Review   Case:  [id number] Date/Time:  01/09/19 0845   Procedure:  TRANSURETHRAL RESECTION OF BLADDER TUMOR (TURBT)/ POST OPERATIVE INSTILLATION OF CHEMOTHERAPY (N/A )   Anesthesia type:  General   Pre-op diagnosis:  BLADDER TUMORS   Location:  [facility] ROOM 08 / [facility]   Surgeon:  Kathie Rhodes, MD      DISCUSSION:67 yo current every day smoker (12.75 pack years) with h/o bladder tumor scheduled for above procedure 01/09/19 with Dr. Kathie Rhodes.   Pt can proceed with planned procedure barring acute status change.  VS: BP (!) 153/89   Pulse 74   Temp 36.8 C (Oral)   Ht 5\' 7"  (1.702 m)   Wt 74.8 kg   SpO2 99%   BMI 25.84 kg/m   PROVIDERS: Patient, No Pcp Per   LABS: Labs reviewed: Acceptable for surgery. (all labs ordered are listed, but only abnormal results are displayed)  Labs Reviewed  COMPREHENSIVE METABOLIC PANEL - Abnormal; Notable for the following components:      Result Value   Glucose, Bld 105 (*)    Calcium 8.8 (*)    All other components within normal limits  CBC     IMAGES: CT Renal Stone 12/01/18 IMPRESSION: 1. No acute findings within the abdomen or pelvis. No renal or ureteral calculi. No bowel obstruction or evidence of bowel wall inflammation. Appendix is normal. 2. Bladder is decompressed limiting characterization. 3. Degenerative changes of the bilateral hip joints and spine, as detailed above.  EKG:   CV:  Past Medical History:  Diagnosis Date  . Arthritis    wrist and knee  . Bladder tumor   . Cataract    right eye  . Collar bone fracture 2017   left    Past Surgical History:  Procedure Laterality Date  . MULTIPLE TOOTH EXTRACTIONS      MEDICATIONS: No current outpatient medications on file.   No current facility-administered medications for this encounter.    Marland Kitchen gemcitabine (GEMZAR) chemo syringe for bladder instillation 2,000 mg    Maia Plan [facility] Pre-Surgical Testing [phone] 01/07/19  12:18 PM

## 2019-01-07 NOTE — Anesthesia Preprocedure Evaluation (Addendum)
Anesthesia Evaluation  Patient identified by MRN, date of birth, ID band Patient awake    Reviewed: Allergy & Precautions, NPO status , Patient's Chart, lab work & pertinent test results  History of Anesthesia Complications Negative for: history of anesthetic complications  Airway Mallampati: III  TM Distance: >3 FB Neck ROM: Full    Dental  (+) Dental Advisory Given, Missing, Poor Dentition   Pulmonary Current Smoker,    breath sounds clear to auscultation       Cardiovascular (-) angina Rhythm:Regular Rate:Normal   Possible undiagnosed HTN based on preoperative BP    Neuro/Psych negative neurological ROS  negative psych ROS   GI/Hepatic negative GI ROS, Neg liver ROS,   Endo/Other  negative endocrine ROS  Renal/GU negative Renal ROS    Bladder tumor     Musculoskeletal  (+) Arthritis ,   Abdominal   Peds  Hematology negative hematology ROS (+)   Anesthesia Other Findings   Reproductive/Obstetrics                          Anesthesia Physical Anesthesia Plan  ASA: III  Anesthesia Plan: General   Post-op Pain Management:    Induction: Intravenous  PONV Risk Score and Plan: 3 and Treatment may vary due to age or medical condition, Ondansetron and Dexamethasone  Airway Management Planned: Oral ETT  Additional Equipment: None  Intra-op Plan:   Post-operative Plan: Extubation in OR  Informed Consent: I have reviewed the patients History and Physical, chart, labs and discussed the procedure including the risks, benefits and alternatives for the proposed anesthesia with the patient or authorized representative who has indicated his/her understanding and acceptance.     Dental advisory given  Plan Discussed with: CRNA and Anesthesiologist  Anesthesia Plan Comments:      Anesthesia Quick Evaluation

## 2019-01-08 NOTE — Discharge Instructions (Signed)

## 2019-01-08 NOTE — H&P (Signed)
HPI: Luis House is a 68 year-old male with newly diagnosed bladder tumors.  He first noticed the symptoms 11/21/2018. He did see the blood in his urine. He has not seen blood clots.   He does have a burning sensation when he urinates. He is not currently having trouble urinating.   He has not had kidney stones. He is not having pain. He has not recently had unwanted weight loss.   His last U/S or CT Scan was 12/01/2018.   12/17/18: He was seen in the emergency room on 11/22/18 with gross hematuria that started 24 hours prior. It occurred with each urination and had been gradually improving. He did initially have some clots. He was found to have only 50 cc PVR. His urine showed no sign of infection. A urine culture was performed and found to be negative. He had reported experiencing gross hematuria and was referred previously in 4/18 but failed to show for his appointment. He was scheduled for another appointment on 11/27/18 are and failed to show for that appointment as well.   He was seen in the emergency room once again on 12/01/18 with gross hematuria that had been intermittent for 4 days. He had been to the emergency room 9 days previously for the same problem. He reported dysuria and frequency as well as diffuse abdominal aching that he described as mild without associated nausea or vomiting or change in his bowel habits. A CT scan without contrast revealed no renal or ureteral calculi or other abnormalities of the genitourinary tract.  He is experiencing irritative voiding symptoms consisting of frequency and nocturia that he said has been occurring for about 5 months. It is associated with some dysuria. He has smoked since he was 68 years old and smokes about a pack a day or so.   He 01/01/19: He returns today for completion of his workup of gross hematuria and reports that he continues to have significant urinary symptoms consisting of frequency and urgency. He also has nocturia. He has not seen  any further gross hematuria.     ALLERGIES: None   MEDICATIONS: None   GU PSH: Locm 300-399Mg /Ml Iodine,1Ml - 12/29/2018    NON-GU PSH: None   GU PMH: Encounter for Prostate Cancer screening (Stable), His prostate was noted to be smooth and benign. He does not know whether a PSA has been done so I will obtain 1 today. - 12/17/2018, (Chronic), - 03/14/2017 Gross hematuria, He has gross hematuria that has been present for some time with irritative voiding symptoms. He needs to be evaluated for urothelial malignancy. His previous CT scan was done without contrast therefore a contrasted CT scan is necessary. In addition we discussed completion of his evaluation with cystoscopy since he is that risk for urothelial malignancy. - 12/17/2018, (Chronic), - 03/14/2017      PMH Notes: .   NON-GU PMH: None   FAMILY HISTORY: Cancer - Father, Sister Heart Disease - Mother Lung Cancer - Brother   SOCIAL HISTORY: Marital Status: Single Preferred Language: English; Ethnicity: Not Hispanic Or Latino; Race: Black or African American Current Smoking Status: Patient smokes.   Tobacco Use Assessment Completed: Used Tobacco in last 30 days? Does not use smokeless tobacco. Does drink.  Does not use drugs. Drinks 1 caffeinated drink per day.    REVIEW OF SYSTEMS:    GU Review Male:   Patient reports frequent urination, hard to postpone urination, and get up at night to urinate. Patient denies burning/ pain with urination,  leakage of urine, stream starts and stops, trouble starting your stream, have to strain to urinate , erection problems, and penile pain.  Gastrointestinal (Upper):   Patient denies nausea, vomiting, and indigestion/ heartburn.  Gastrointestinal (Lower):   Patient denies diarrhea and constipation.  Constitutional:   Patient denies fever, night sweats, weight loss, and fatigue.  Skin:   Patient denies skin rash/ lesion and itching.  Eyes:   Patient denies blurred vision and double vision.   Ears/ Nose/ Throat:   Patient denies sore throat and sinus problems.  Hematologic/Lymphatic:   Patient denies swollen glands and easy bruising.  Cardiovascular:   Patient denies leg swelling and chest pains.  Respiratory:   Patient denies cough and shortness of breath.  Endocrine:   Patient denies excessive thirst.  Musculoskeletal:   Patient denies back pain and joint pain.  Neurological:   Patient denies headaches and dizziness.  Psychologic:   Patient denies depression and anxiety.   VITAL SIGNS:    Weight 175 lb / 79.38 kg  Height 67 in / 170.18 cm  BP 134/92 mmHg  Pulse 78 /min  BMI 27.4 kg/m   GU PHYSICAL EXAMINATION:    Anus and Perineum: No hemorrhoids. No anal stenosis. No rectal fissure, no anal fissure. No edema, no dimple, no perineal tenderness, no anal tenderness.  Scrotum: No lesions. No edema. No cysts. No warts.  Epididymides: Right: no spermatocele, no masses, no cysts, no tenderness, no induration, no enlargement. Left: no spermatocele, no masses, no cysts, no tenderness, no induration, no enlargement.  Testes: No tenderness, no swelling, no enlargement left testes. No tenderness, no swelling, no enlargement right testes. Normal location left testes. Normal location right testes. No mass, no cyst, no varicocele, no hydrocele left testes. No mass, no cyst, no varicocele, no hydrocele right testes.  Urethral Meatus: Normal size. No lesion, no wart, no discharge, no polyp. Normal location.  Penis: Penis uncircumcised. No foreskin warts, no cracks. No dorsal peyronie's plaques, no left corporal peyronie's plaques, no right corporal peyronie's plaques, no scarring, no shaft warts. No balanitis, no meatal stenosis.   Prostate: Prostate 1 1/2+ size. Left lobe normal consistency, right lobe normal consistency. Symmetrical lobes. No prostate nodule. Left lobe no tenderness, right lobe no tenderness.   Seminal Vesicles: Nonpalpable.  Sphincter Tone: Normal sphincter. No rectal  tenderness. No rectal mass.    MULTI-SYSTEM PHYSICAL EXAMINATION:    Constitutional: Well-nourished. No physical deformities. Normally developed. Good grooming.  Neck: Neck symmetrical, not swollen. Normal tracheal position.  Respiratory: No labored breathing, no use of accessory muscles.   Cardiovascular: Normal temperature, normal extremity pulses, no swelling, no varicosities.  Lymphatic: No enlargement of neck, axillae, groin.  Skin: No paleness, no jaundice, no cyanosis. No lesion, no ulcer, no rash.  Neurologic / Psychiatric: Oriented to time, oriented to place, oriented to person. No depression, no anxiety, no agitation.  Gastrointestinal: No mass, no tenderness, no rigidity, non obese abdomen.  Eyes: Normal conjunctivae. Normal eyelids.  Ears, Nose, Mouth, and Throat: Left ear no scars, no lesions, no masses. Right ear no scars, no lesions, no masses. Nose no scars, no lesions, no masses. Normal hearing. Normal lips.  Musculoskeletal: Normal gait and station of head and neck.       PAST DATA REVIEWED:  Source Of History:  Patient  Lab Test Review:   PSA  Records Review:   Previous Patient Records, POC Tool  X-Ray Review: C.T. Abdomen/Pelvis: Reviewed Films. Reviewed Report. Discussed With Patient. EXAM: CT  ABDOMEN AND PELVIS WITH CONTRAST TECHNIQUE: Multidetector CT imaging of the abdomen and pelvis was performed using the standard protocol following bolus administration of intravenous contrast. CONTRAST: 125 mL of Omnipaque 300. COMPARISON: CT the abdomen and pelvis 12/01/2018. FINDINGS: Lower chest: Unremarkable. Hepatobiliary: No suspicious cystic or solid hepatic lesions. No intra or extrahepatic biliary ductal dilatation. Gallbladder is normal in appearance. Pancreas: No pancreatic mass. No pancreatic ductal dilatation. No pancreatic or peripancreatic fluid or inflammatory changes. Spleen: Unremarkable. Adrenals/Urinary Tract: Subcentimeter low-attenuation lesion in the lower pole  of the left kidney, too small to characterize, but statistically likely to represent a cyst. Right kidney and bilateral adrenal glands are normal in appearance. No hydroureteronephrosis. Postcontrast delayed images demonstrate no definite filling defect within the collecting system of either kidney or along the course of either ureter. There is irregularity of the urinary bladder wall noted on the delayed images, which corresponds to areas of mucosal hyperenhancement on early post contrast images (best demonstrated on axial image 76 of series 6 and axial image 67 of series 2 respectively). Stomach/Bowel: Normal appearance of the stomach. No pathologic dilatation of small bowel or colon. The appendix is not confidently identified and may be surgically absent. Regardless, there are no inflammatory changes noted adjacent to the cecum to suggest the presence of an acute appendicitis at this time. Vascular/Lymphatic: Aortic atherosclerosis, without evidence of aneurysm or dissection in the abdominal or pelvic vasculature. No lymphadenopathy noted in the abdomen or pelvis. Reproductive: Prostate gland and seminal vesicles are unremarkable in appearance. Other: No significant volume of ascites. No pneumoperitoneum. Musculoskeletal: There are no aggressive appearing lytic or blastic lesions noted in the visualized portions of the skeleton. IMPRESSION: 1. Irregular areas of mural thickening and urothelial hyperenhancement in the urinary bladder, as discussed above. Further evaluation for potential urothelial neoplasm is suggested. No other source for hematuria identified on today's examination. 2. Subcentimeter low-attenuation lesion in the lower pole the left kidney, too small to characterize, but favored to represent a tiny cyst. 3. Aortic atherosclerosis.     12/17/18  PSA  Total PSA 0.56 ng/mL    PROCEDURES:         Flexible Cystoscopy - Done on 01/01/19 Meatus:  Normal size. Normal location. Normal condition.   Urethra:  No strictures.  External Sphincter:  Normal.  Verumontanum:  Normal.  Prostate:  Borderline obstructing. Moderate hyperplasia.  Bladder Neck:  Non-obstructing.  Ureteral Orifices:  Normal location. Normal size. Normal shape. Effluxed clear urine.  Bladder:  3+ cm tumor. This involves the right and left walls of the bladder.         Urinalysis w/Scope Dipstick Dipstick Cont'd Micro  Color: Amber Bilirubin: Neg mg/dL WBC/hpf: 20 - 40/hpf  Appearance: Cloudy Ketones: Neg mg/dL RBC/hpf: >60/hpf  Specific Gravity: 1.025 Blood: 3+ ery/uL Bacteria: Many (>50/hpf)  pH: 5.5 Protein: 1+ mg/dL Cystals: NS (Not Seen)  Glucose: Neg mg/dL Urobilinogen: 0.2 mg/dL Casts: NS (Not Seen)    Nitrites: Neg Trichomonas: Not Present    Leukocyte Esterase: 1+ leu/uL Mucous: Not Present      Epithelial Cells: 0 - 5/hpf      Yeast: NS (Not Seen)      Sperm: Not Present    ASSESSMENT/PLAN:     ICD-10 Details  1 GU:   Gross hematuria - R31.0 Stable - He was found to have no abnormality of his upper tract however his gross hematuria is clearly secondary to the neoplasm noted in his bladder cystoscopically.  2  Bladder tumor/neoplasm - D41.4 He has papillary lesions involving a large portion of his bladder including the left and right walls extending toward the bladder neck region.  4   Urinary Frequency - R35.0 Stable - He is having fairly significant voiding symptoms including frequency and urgency almost certainly secondary to the bladder tumor but possibly also due to infection which will need to be ruled out  3 NON-GU:   Pyuria/other UA findings - R82.79 He has pyuria and bacteriuria. He is going to be scheduled for surgery and therefore needs to have sterile urine. His urine will therefore be cultured.          Notes:   I went over the results of the CT scan as well as my cystoscopic findings today which have revealed a bladder mass consistent with transitional cell carcinoma. We discussed the fact  that currently there is no evidence of extravesical extension or pelvic adenopathy based on CT scan findings. Further characterization of the lesion is required for grading and staging purposes. We discussed proceeding with evaluation using transurethral resection of the lesion. I have discussed the procedure in detail as well as the potential risks and complications associated with this form of surgery. We also discussed the probability of successful resection of the intravesical portion of this lesion. I have recommended, as long as there is no contraindication at the time of surgery, the placement of intravesical Gemcitabine in order to reduce the risk of recurrence. We did discuss the potential side effects of this form of intravesical chemotherapy. The procedure will be performed under anesthesia as an outpatient.

## 2019-01-09 ENCOUNTER — Other Ambulatory Visit: Payer: Self-pay

## 2019-01-09 ENCOUNTER — Ambulatory Visit (HOSPITAL_COMMUNITY)
Admission: RE | Admit: 2019-01-09 | Discharge: 2019-01-09 | Disposition: A | Discharge status: Home / Self Care | Payer: Medicare Other | Attending: Urology | Admitting: Urology

## 2019-01-09 ENCOUNTER — Encounter (HOSPITAL_COMMUNITY): Payer: Self-pay

## 2019-01-09 ENCOUNTER — Ambulatory Visit (HOSPITAL_COMMUNITY): Payer: Medicare Other | Admitting: Physician Assistant

## 2019-01-09 ENCOUNTER — Encounter (HOSPITAL_COMMUNITY)
Admission: RE | Disposition: A | Discharge status: Home / Self Care | Payer: Self-pay | Source: Home / Self Care | Attending: Urology

## 2019-01-09 ENCOUNTER — Ambulatory Visit (HOSPITAL_COMMUNITY): Payer: Medicare Other | Admitting: Anesthesiology

## 2019-01-09 DIAGNOSIS — C679 Malignant neoplasm of bladder, unspecified: Secondary | ICD-10-CM | POA: Diagnosis not present

## 2019-01-09 DIAGNOSIS — F17210 Nicotine dependence, cigarettes, uncomplicated: Secondary | ICD-10-CM | POA: Insufficient documentation

## 2019-01-09 DIAGNOSIS — D494 Neoplasm of unspecified behavior of bladder: Secondary | ICD-10-CM | POA: Diagnosis present

## 2019-01-09 HISTORY — PX: TRANSURETHRAL RESECTION OF BLADDER TUMOR: SHX2575

## 2019-01-09 SURGERY — TURBT (TRANSURETHRAL RESECTION OF BLADDER TUMOR)
Anesthesia: General

## 2019-01-09 MED ORDER — SODIUM CHLORIDE 0.9 % IR SOLN
Status: DC | PRN
  Administered 2019-01-09 (×4): 6000 mL via INTRAVESICAL

## 2019-01-09 MED ORDER — PHENAZOPYRIDINE HCL 200 MG PO TABS
ORAL_TABLET | ORAL | Status: AC
  Administered 2019-01-09: 12:00:00 200 mg
  Filled 2019-01-09: qty 2

## 2019-01-09 MED ORDER — FENTANYL CITRATE (PF) 250 MCG/5ML IJ SOLN
INTRAMUSCULAR | Status: AC
  Filled 2019-01-09: qty 5

## 2019-01-09 MED ORDER — MIDAZOLAM HCL 5 MG/5ML IJ SOLN
INTRAMUSCULAR | Status: DC | PRN
  Administered 2019-01-09: 2 mg via INTRAVENOUS

## 2019-01-09 MED ORDER — HYDRALAZINE HCL 20 MG/ML IJ SOLN
INTRAMUSCULAR | Status: AC
  Filled 2019-01-09: qty 1

## 2019-01-09 MED ORDER — ONDANSETRON HCL 4 MG/2ML IJ SOLN
INTRAMUSCULAR | Status: AC
  Filled 2019-01-09: qty 2

## 2019-01-09 MED ORDER — LACTATED RINGERS IV SOLN
INTRAVENOUS | Status: DC
  Administered 2019-01-09 (×2): via INTRAVENOUS

## 2019-01-09 MED ORDER — LIDOCAINE 2% (20 MG/ML) 5 ML SYRINGE
INTRAMUSCULAR | Status: AC
  Filled 2019-01-09: qty 5

## 2019-01-09 MED ORDER — PROPOFOL 10 MG/ML IV BOLUS
INTRAVENOUS | Status: DC | PRN
  Administered 2019-01-09: 150 mg via INTRAVENOUS

## 2019-01-09 MED ORDER — ROCURONIUM BROMIDE 100 MG/10ML IV SOLN
INTRAVENOUS | Status: DC | PRN
  Administered 2019-01-09: 10 mg via INTRAVENOUS
  Administered 2019-01-09: 50 mg via INTRAVENOUS

## 2019-01-09 MED ORDER — HYDRALAZINE HCL 20 MG/ML IJ SOLN
10.0000 mg | INTRAMUSCULAR | Status: AC | PRN
  Administered 2019-01-09 (×4): 10 mg via INTRAVENOUS

## 2019-01-09 MED ORDER — MIDAZOLAM HCL 2 MG/2ML IJ SOLN
INTRAMUSCULAR | Status: AC
  Filled 2019-01-09: qty 2

## 2019-01-09 MED ORDER — INDIGOTINDISULFONATE SODIUM 8 MG/ML IJ SOLN
INTRAMUSCULAR | Status: DC | PRN
  Administered 2019-01-09: 5 mL via INTRAVENOUS

## 2019-01-09 MED ORDER — DEXAMETHASONE SODIUM PHOSPHATE 10 MG/ML IJ SOLN
INTRAMUSCULAR | Status: DC | PRN
  Administered 2019-01-09: 8 mg via INTRAVENOUS

## 2019-01-09 MED ORDER — GLYCOPYRROLATE 0.2 MG/ML IJ SOLN
INTRAMUSCULAR | Status: DC | PRN
  Administered 2019-01-09: 0.2 mg via INTRAVENOUS

## 2019-01-09 MED ORDER — FENTANYL CITRATE (PF) 100 MCG/2ML IJ SOLN
INTRAMUSCULAR | Status: AC
  Filled 2019-01-09: qty 2

## 2019-01-09 MED ORDER — ONDANSETRON HCL 4 MG/2ML IJ SOLN: 4.0000 mg | Freq: Once | INTRAMUSCULAR | Status: DC | PRN

## 2019-01-09 MED ORDER — FENTANYL CITRATE (PF) 100 MCG/2ML IJ SOLN
25.0000 ug | INTRAMUSCULAR | Status: DC | PRN
  Administered 2019-01-09 (×3): 50 ug via INTRAVENOUS

## 2019-01-09 MED ORDER — SUGAMMADEX SODIUM 200 MG/2ML IV SOLN
INTRAVENOUS | Status: DC | PRN
  Administered 2019-01-09: 150 mg via INTRAVENOUS

## 2019-01-09 MED ORDER — ONDANSETRON HCL 4 MG/2ML IJ SOLN
INTRAMUSCULAR | Status: DC | PRN
  Administered 2019-01-09: 4 mg via INTRAVENOUS

## 2019-01-09 MED ORDER — SUGAMMADEX SODIUM 200 MG/2ML IV SOLN
INTRAVENOUS | Status: AC
  Filled 2019-01-09: qty 2

## 2019-01-09 MED ORDER — LIDOCAINE HCL (CARDIAC) PF 100 MG/5ML IV SOSY
PREFILLED_SYRINGE | INTRAVENOUS | Status: DC | PRN
  Administered 2019-01-09: 80 mg via INTRAVENOUS

## 2019-01-09 MED ORDER — PHENAZOPYRIDINE HCL 200 MG PO TABS: 200.0000 mg | ORAL_TABLET | Freq: Three times a day (TID) | ORAL | 0 refills | Status: DC | PRN

## 2019-01-09 MED ORDER — PROPOFOL 10 MG/ML IV BOLUS
INTRAVENOUS | Status: AC
  Filled 2019-01-09: qty 20

## 2019-01-09 MED ORDER — CIPROFLOXACIN IN D5W 400 MG/200ML IV SOLN
400.0000 mg | INTRAVENOUS | Status: AC
  Administered 2019-01-09: 400 mg via INTRAVENOUS
  Filled 2019-01-09: qty 200

## 2019-01-09 MED ORDER — 0.9 % SODIUM CHLORIDE (POUR BTL) OPTIME
TOPICAL | Status: DC | PRN
  Administered 2019-01-09: 1000 mL

## 2019-01-09 MED ORDER — HYDROCODONE-ACETAMINOPHEN 10-325 MG PO TABS: 1.0000 | ORAL_TABLET | ORAL | 0 refills | Status: DC | PRN

## 2019-01-09 MED ORDER — OXYCODONE HCL 5 MG/5ML PO SOLN: 5.0000 mg | Freq: Once | ORAL | Status: DC | PRN

## 2019-01-09 MED ORDER — OXYCODONE HCL 5 MG PO TABS: 5.0000 mg | ORAL_TABLET | Freq: Once | ORAL | Status: DC | PRN

## 2019-01-09 MED ORDER — ROCURONIUM BROMIDE 10 MG/ML (PF) SYRINGE
PREFILLED_SYRINGE | INTRAVENOUS | Status: AC
  Filled 2019-01-09: qty 10

## 2019-01-09 MED ORDER — FENTANYL CITRATE (PF) 100 MCG/2ML IJ SOLN
INTRAMUSCULAR | Status: DC | PRN
  Administered 2019-01-09 (×4): 50 ug via INTRAVENOUS

## 2019-01-09 SURGICAL SUPPLY — 17 items
BAG URINE DRAINAGE (UROLOGICAL SUPPLIES) IMPLANT
BAG URO CATCHER STRL LF (MISCELLANEOUS) ×2 IMPLANT
COVER WAND RF STERILE (DRAPES) IMPLANT
ELECT REM PT RETURN 15FT ADLT (MISCELLANEOUS) ×2 IMPLANT
EVACUATOR MICROVAS BLADDER (UROLOGICAL SUPPLIES) ×1 IMPLANT
GLOVE BIOGEL M 8.0 STRL (GLOVE) ×2 IMPLANT
GOWN STRL REUS W/TWL XL LVL3 (GOWN DISPOSABLE) ×2 IMPLANT
HOLDER FOLEY CATH W/STRAP (MISCELLANEOUS) IMPLANT
KIT TURNOVER KIT A (KITS) IMPLANT
LOOP CUT BIPOLAR 24F LRG (ELECTROSURGICAL) ×1 IMPLANT
MANIFOLD NEPTUNE II (INSTRUMENTS) ×2 IMPLANT
NS IRRIG 1000ML POUR BTL (IV SOLUTION) ×2 IMPLANT
PACK CYSTO (CUSTOM PROCEDURE TRAY) ×2 IMPLANT
SET ASPIRATION TUBING (TUBING) ×2 IMPLANT
SYRINGE IRR TOOMEY STRL 70CC (SYRINGE) IMPLANT
TUBING CONNECTING 10 (TUBING) ×2 IMPLANT
TUBING UROLOGY SET (TUBING) ×3 IMPLANT

## 2019-01-09 NOTE — Anesthesia Procedure Notes (Signed)
Procedure Name: Intubation Date/Time: 01/09/2019 9:43 AM Performed by: Glory Buff, CRNA Pre-anesthesia Checklist: Patient identified, Emergency Drugs available, Suction available and Patient being monitored Patient Re-evaluated:Patient Re-evaluated prior to induction Oxygen Delivery Method: Circle system utilized Preoxygenation: Pre-oxygenation with 100% oxygen Induction Type: IV induction Ventilation: Mask ventilation without difficulty Laryngoscope Size: Miller and 3 Grade View: Grade I Tube type: Oral Tube size: 7.5 mm Number of attempts: 1 Airway Equipment and Method: Stylet and Oral airway Placement Confirmation: ETT inserted through vocal cords under direct vision,  positive ETCO2 and breath sounds checked- equal and bilateral Secured at: 22 cm Tube secured with: Tape Dental Injury: Teeth and Oropharynx as per pre-operative assessment

## 2019-01-09 NOTE — Transfer of Care (Signed)
Immediate Anesthesia Transfer of Care Note  Patient: Luis House  Procedure(s) Performed: TRANSURETHRAL RESECTION OF BLADDER TUMOR (TURBT)/ POST OPERATIVE INSTILLATION OF CHEMOTHERAPY (N/A )  Patient Location: PACU  Anesthesia Type:General  Level of Consciousness: awake, alert  and oriented  Airway & Oxygen Therapy: Patient Spontanous Breathing and Patient connected to face mask oxygen  Post-op Assessment: Report given to RN and Post -op Vital signs reviewed and stable  Post vital signs: Reviewed and stable  Last Vitals:  Vitals Value Taken Time  BP    Temp    Pulse 69 01/09/2019 11:04 AM  Resp 10 01/09/2019 11:04 AM  SpO2 99 % 01/09/2019 11:04 AM  Vitals shown include unvalidated device data.  Last Pain:  Vitals:   01/09/19 0711  TempSrc: Oral         Complications: No apparent anesthesia complications

## 2019-01-09 NOTE — Anesthesia Postprocedure Evaluation (Signed)
Anesthesia Post Note  Patient: Luis House  Procedure(s) Performed: TRANSURETHRAL RESECTION OF BLADDER TUMOR (TURBT)/ POST OPERATIVE INSTILLATION OF CHEMOTHERAPY (N/A )     Patient location during evaluation: PACU Anesthesia Type: General Level of consciousness: awake and alert Pain management: pain level controlled Vital Signs Assessment: post-procedure vital signs reviewed and stable Respiratory status: spontaneous breathing, nonlabored ventilation and respiratory function stable Cardiovascular status: blood pressure returned to baseline Postop Assessment: no apparent nausea or vomiting Anesthetic complications: no    Last Vitals:  Vitals:   01/09/19 1230 01/09/19 1245  BP: (!) 182/100 (!) 158/88  Pulse: (!) 109 (!) 109  Resp: 17 10  Temp:    SpO2: 98% 98%    Last Pain:  Vitals:   01/09/19 1245  TempSrc:   PainSc: 0-No pain                 Audry Pili

## 2019-01-09 NOTE — Op Note (Signed)
PATIENT:  Luis House  PRE-OPERATIVE DIAGNOSIS: Bladder tumor  POST-OPERATIVE DIAGNOSIS: Same  PROCEDURE:  Procedure(s): 1. TRANSURETHRAL RESECTION OF BLADDER TUMOR (TURBT) (6 cm.)  SURGEON:  Surgeon(s): Claybon Jabs  ANESTHESIA:   General  EBL:  less than 50 mL  DRAINS: None  SPECIMEN:  Bladder tumor  DISPOSITION OF SPECIMEN:  PATHOLOGY  Indication: Luis House is a 68 year old male who initially saw gross hematuria in 2/20.  At that time evaluation revealed no sign of infection with a negative culture and he was referred but failed to show for several appointments.  He was then seen in the emergency room again with gross hematuria and a CT scan without contrast revealed no evidence of renal or ureteral calculi.  He was referred and was then evaluated and was noted to have smoked since he was a 68 year old 1 pack a day.  A CT scan was performed with contrast and revealed no abnormality of the upper tract or worrisome adenopathy however there ever irregular areas of mural thickening of the urothelium was noted and cystoscopically he was found to have bladder tumors involving a large portion of the bladder.  He is therefore brought to the operating room today for transurethral resection of his bladder tumors.  Description of operation: The patient was taken to the operating room and administered general anesthesia. They were then placed on the table and moved to the dorsal lithotomy position after which the genitalia was sterilely prepped and draped. An official timeout was then performed.  The 48 French resectoscope with the 30 lens and visual obturator were then passed into the bladder under direct visualization. Urethra appeared normal. The visual obturator was then removed and the Gyrus resectoscope element with 30  lens was then inserted and the bladder was fully and systematically inspected.  I noted papillary and sessile tumors involving about 80% of the bladder neck region.   These tumors extended posteriorly to involve all of the trigone with both the right and left ureteral orifice involved and unidentifiable.  It involved the floor of the bladder and was noted to extend to the back of the bladder on the right and left sides on the floor.  A large portion of the left wall of the bladder was involved as well and a large portion of the anterior bladder extending to the bladder neck.  I first began by resecting the tumor at the level of the bladder neck circumferentially and then resected on back to the posterior wall of the bladder.  It appeared that approximately 1/4-1/3 of the bladder was involved with abnormal appearing mucosa/bladder tumor.  All of the tumor that was present was eventually resected.  Bleeding was controlled with electrocautery and during the surgery, because I was unable to identify either of his right or left ureteral orifice he received 1 ampoule of indigo carmine.  I continue to try and observe for this to reflux and at one point did see some blue on the floor of the bladder indicating there was reflux present but I could never identify either of the UOs.  Reinspection of the bladder revealed all obvious tumor had been fully resected and there was no evidence of perforation. The Microvasive evacuator was then used to irrigate the bladder and remove all of the portions of bladder tumor which were sent to pathology. I then removed the resectoscope.  I elected not to instill postoperative gemcitabine due to the fact that there was such an extensive amount of resection and although  there was no perforation that occurred at any time I did not want to risk causing any form of irritation to a resected ureteral orifice and potentially resulting in scarring and obstruction.  He will need to undergo renal ultrasound when he returns to the office for follow-up.  PLAN OF CARE: Discharge to home after PACU  PATIENT DISPOSITION:  PACU - hemodynamically stable.

## 2019-01-09 NOTE — Progress Notes (Signed)
Pt family called to review medications pyridium and [place].  Family updated.

## 2019-01-12 ENCOUNTER — Encounter (HOSPITAL_COMMUNITY): Payer: Self-pay | Admitting: Urology

## 2019-01-17 ENCOUNTER — Emergency Department (HOSPITAL_COMMUNITY)
Admission: EM | Admit: 2019-01-17 | Discharge: 2019-01-17 | Disposition: A | Discharge status: Home / Self Care | Payer: Medicare Other | Attending: Emergency Medicine | Admitting: Emergency Medicine

## 2019-01-17 ENCOUNTER — Other Ambulatory Visit: Payer: Self-pay

## 2019-01-17 ENCOUNTER — Encounter (HOSPITAL_COMMUNITY): Payer: Self-pay | Admitting: Emergency Medicine

## 2019-01-17 DIAGNOSIS — R350 Frequency of micturition: Secondary | ICD-10-CM | POA: Diagnosis not present

## 2019-01-17 DIAGNOSIS — R3911 Hesitancy of micturition: Secondary | ICD-10-CM | POA: Insufficient documentation

## 2019-01-17 DIAGNOSIS — M549 Dorsalgia, unspecified: Secondary | ICD-10-CM | POA: Diagnosis not present

## 2019-01-17 DIAGNOSIS — R310 Gross hematuria: Secondary | ICD-10-CM | POA: Diagnosis not present

## 2019-01-17 DIAGNOSIS — F17210 Nicotine dependence, cigarettes, uncomplicated: Secondary | ICD-10-CM | POA: Diagnosis not present

## 2019-01-17 DIAGNOSIS — R300 Dysuria: Secondary | ICD-10-CM | POA: Diagnosis present

## 2019-01-17 DIAGNOSIS — Z8551 Personal history of malignant neoplasm of bladder: Secondary | ICD-10-CM | POA: Diagnosis not present

## 2019-01-17 DIAGNOSIS — R1030 Lower abdominal pain, unspecified: Secondary | ICD-10-CM | POA: Diagnosis not present

## 2019-01-17 LAB — CBC WITH DIFFERENTIAL/PLATELET
Abs Immature Granulocytes: 0.04 10*3/uL (ref 0.00–0.07)
Basophils Absolute: 0 10*3/uL (ref 0.0–0.1)
Basophils Relative: 0 %
Eosinophils Absolute: 0.2 10*3/uL (ref 0.0–0.5)
Eosinophils Relative: 2 %
HCT: 42 % (ref 39.0–52.0)
Hemoglobin: 13.2 g/dL (ref 13.0–17.0)
Immature Granulocytes: 0 %
Lymphocytes Relative: 19 %
Lymphs Abs: 1.8 10*3/uL (ref 0.7–4.0)
MCH: 28.1 pg (ref 26.0–34.0)
MCHC: 31.4 g/dL (ref 30.0–36.0)
MCV: 89.6 fL (ref 80.0–100.0)
Monocytes Absolute: 0.8 10*3/uL (ref 0.1–1.0)
Monocytes Relative: 8 %
Neutro Abs: 6.7 10*3/uL (ref 1.7–7.7)
Neutrophils Relative %: 71 %
Platelets: 287 10*3/uL (ref 150–400)
RBC: 4.69 MIL/uL (ref 4.22–5.81)
RDW: 14 % (ref 11.5–15.5)
WBC: 9.5 10*3/uL (ref 4.0–10.5)
nRBC: 0 % (ref 0.0–0.2)

## 2019-01-17 LAB — BASIC METABOLIC PANEL
Anion gap: 9 (ref 5–15)
BUN: 19 mg/dL (ref 8–23)
CO2: 22 mmol/L (ref 22–32)
Calcium: 8.5 mg/dL — ABNORMAL LOW (ref 8.9–10.3)
Chloride: 107 mmol/L (ref 98–111)
Creatinine, Ser: 0.86 mg/dL (ref 0.61–1.24)
GFR calc Af Amer: >60 mL/min (ref >60)
GFR calc non Af Amer: >60 mL/min (ref >60)
Glucose, Bld: 90 mg/dL (ref 70–99)
Potassium: 3.9 mmol/L (ref 3.5–5.1)
Sodium: 138 mmol/L (ref 135–145)

## 2019-01-17 LAB — URINALYSIS, MICROSCOPIC (REFLEX)
RBC / HPF: >50 RBC/hpf (ref 0–5)
Squamous Epithelial / HPF: NONE SEEN (ref 0–5)

## 2019-01-17 LAB — URINALYSIS, ROUTINE W REFLEX MICROSCOPIC

## 2019-01-17 MED ORDER — NITROFURANTOIN MONOHYD MACRO 100 MG PO CAPS
100.0000 mg | ORAL_CAPSULE | Freq: Once | ORAL | Status: DC
  Filled 2019-01-17: qty 1

## 2019-01-17 MED ORDER — NITROFURANTOIN MONOHYD MACRO 100 MG PO CAPS: 100.0000 mg | ORAL_CAPSULE | Freq: Two times a day (BID) | ORAL | 0 refills | Status: DC

## 2019-01-17 NOTE — ED Provider Notes (Signed)
[facility] DEPT Provider Note   CSN: [id number] Arrival date & time: 01/17/19  1545    History   Chief Complaint Chief Complaint  Patient presents with  . Dysuria    HPI Luis House is a 68 y.o. male.     Patient with uroscopy performed by Dr. 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  Patient had been doing well after his procedure.  He was on Pyridium and Vicodin.  Last dose of Pyridium was 2 days ago when he states that his urine became clearer.  After pain began he continued to note dark urine with blood.  He denies any fevers or chills, nausea or vomiting.  He has chronic back pain that is not different.  No diarrhea or blood in the stool or pain with defecation.  Patient states that he has a sensation of incomplete voiding after urinating.  Onset of symptoms acute.  Course is constant.  Nothing make symptoms better or worse.     Past Medical History:  Diagnosis Date  . Arthritis    wrist and knee  . Bladder tumor   . Cataract    right eye  . Collar bone fracture 2017   left    There are no active problems to display for this patient.   Past Surgical History:  Procedure Laterality Date  . MULTIPLE TOOTH EXTRACTIONS    . TRANSURETHRAL RESECTION OF BLADDER TUMOR N/A 01/09/2019   Procedure: TRANSURETHRAL RESECTION OF BLADDER TUMOR (TURBT)/ POST OPERATIVE INSTILLATION OF CHEMOTHERAPY;  Surgeon: Kathie Rhodes, MD;  Location: [facility];  Service: Urology;  Laterality: N/A;        Home Medications    Prior to Admission medications   Medication Sig Start Date End Date Taking? Authorizing Provider  HYDROcodone-acetaminophen (NORCO) 10-325 MG tablet Take 1-2 tablets by mouth every 4 (four) hours as needed for  moderate pain. Maximum dose per 24 hours - 8 pills 01/09/19   Kathie Rhodes, MD  phenazopyridine (PYRIDIUM) 200 MG tablet Take 1 tablet (200 mg total) by mouth 3 (three) times daily as needed for pain. 01/09/19   Kathie Rhodes, MD    Family History No family history on file.  Social History Social History   Tobacco Use  . Smoking status: Current Every Day Smoker    Packs/day: 0.25    Years: 51.00    Pack years: 12.75    Types: Cigarettes  . Smokeless tobacco: Never Used  Substance Use Topics  . Alcohol use: Yes    Comment: 2 beers per day  . Drug use: Not on file     Allergies   Patient has no known allergies.   Review of Systems Review of Systems  Constitutional: Negative for chills and fever.  HENT: Negative for rhinorrhea and sore throat.   Eyes: Negative for redness.  Respiratory: Negative for cough.   Cardiovascular: Negative for chest pain.  Gastrointestinal: Positive for abdominal pain (Mild, suprapubic pain). Negative for diarrhea, nausea and vomiting.  Genitourinary: Positive for difficulty urinating (Mild hesitancy), frequency and hematuria. Negative for discharge, dysuria, flank pain, genital sores, penile pain, penile swelling, scrotal swelling, testicular pain and urgency.  Musculoskeletal: Positive for back pain (chronic). Negative for myalgias.  Skin: Negative for rash.  Neurological: Negative for headaches.     Physical Exam Updated Vital Signs BP Marland Kitchen)  138/93 (BP Location: Left Arm)   Pulse 78   Temp 98 F (36.7 C) (Oral)   Resp 18   SpO2 98%   Physical Exam Vitals signs and nursing note reviewed.  Constitutional:      Appearance: He is well-developed.  HENT:     Head: Normocephalic and atraumatic.  Eyes:     General:        Right eye: No discharge.        Left eye: No discharge.     Conjunctiva/sclera: Conjunctivae normal.  Neck:     Musculoskeletal: Normal range of motion and neck supple.  Cardiovascular:     Rate and Rhythm: Normal rate  and regular rhythm.     Heart sounds: Normal heart sounds.  Pulmonary:     Effort: Pulmonary effort is normal.     Breath sounds: Normal breath sounds.  Abdominal:     Palpations: Abdomen is soft.     Tenderness: There is abdominal tenderness. There is no guarding or rebound.     Comments: Minimal, suprapubic  Genitourinary:    Penis: Normal. No tenderness, swelling or lesions.      Scrotum/Testes:        Right: Tenderness not present.        Left: Tenderness not present.     Comments: Normal external GU exam, nurse tech at bedside. Skin:    General: Skin is warm and dry.  Neurological:     Mental Status: He is alert.      ED Treatments / Results  Labs (all labs ordered are listed, but only abnormal results are displayed) Labs Reviewed  URINALYSIS, ROUTINE W REFLEX MICROSCOPIC - Abnormal; Notable for the following components:      Result Value   Color, Urine RED (*)    APPearance TURBID (*)    Glucose, UA   (*)    Value: TEST NOT REPORTED DUE TO COLOR INTERFERENCE OF URINE PIGMENT   Hgb urine dipstick   (*)    Value: TEST NOT REPORTED DUE TO COLOR INTERFERENCE OF URINE PIGMENT   Bilirubin Urine   (*)    Value: TEST NOT REPORTED DUE TO COLOR INTERFERENCE OF URINE PIGMENT   Ketones, ur   (*)    Value: TEST NOT REPORTED DUE TO COLOR INTERFERENCE OF URINE PIGMENT   Protein, ur   (*)    Value: TEST NOT REPORTED DUE TO COLOR INTERFERENCE OF URINE PIGMENT   Nitrite   (*)    Value: TEST NOT REPORTED DUE TO COLOR INTERFERENCE OF URINE PIGMENT   Leukocytes,Ua   (*)    Value: TEST NOT REPORTED DUE TO COLOR INTERFERENCE OF URINE PIGMENT   All other components within normal limits  BASIC METABOLIC PANEL - Abnormal; Notable for the following components:   Calcium 8.5 (*)    All other components within normal limits  URINALYSIS, MICROSCOPIC (REFLEX) - Abnormal; Notable for the following components:   Bacteria, UA FEW (*)    All other components within normal limits  URINE CULTURE   CBC WITH DIFFERENTIAL/PLATELET    EKG None  Radiology No results found.  Procedures Procedures (including critical care time)  Medications Ordered in ED Medications  nitrofurantoin (macrocrystal-monohydrate) (MACROBID) capsule 100 mg (has no administration in time range)     Initial Impression / Assessment and Plan / ED Course  I have reviewed the triage vital signs and the nursing notes.  Pertinent labs & imaging results that were available during my care of  the patient were reviewed by me and considered in my medical decision making (see chart for details).        Patient seen and examined. Work-up initiated.    Vital signs reviewed and are as follows: BP (!) 138/93 (BP Location: Left Arm)   Pulse 78   Temp 98 F (36.7 C) (Oral)   Resp 18   SpO2 98%   Discussed with Dr. Alvino Chapel.   Discussed results with Dr. Tresa Moore of urology. Reccs urine culture, macrobid, post-void residual.   PVR<300cc and will d/c.   Encouraged return to the emergency department with worsening symptoms, new symptoms, fever, other concerns.  Encouraged patient to follow-up with urology this week.  Final Clinical Impressions(s) / ED Diagnoses   Final diagnoses:  Dysuria  Gross hematuria   Patient with dysuria, suprapubic pain without systemic symptoms of illness after recent cystoscopy.  Urine culture pending.  Normal post void residual.  Urology aware.  Will start on Macrobid.  ED Discharge Orders         Ordered    nitrofurantoin, macrocrystal-monohydrate, (MACROBID) 100 MG capsule  2 times daily     01/17/19 1952           Suann Larry 01/17/19 Nancee Liter, MD 01/17/19 [phone]

## 2019-01-17 NOTE — Discharge Instructions (Signed)
Please read and follow all provided instructions.  Your diagnoses today include:  1. Dysuria   2. Gross hematuria     Tests performed today include:  Urine test/culture  Blood counts and kidney function - normal  Vital signs. See below for your results today.   Medications prescribed:   Macrobid - antibiotic for urinary tract infection  You have been prescribed an antibiotic medicine: take the entire course of medicine even if you are feeling better. Stopping early can cause the antibiotic not to work.  Home care instructions:  Follow any educational materials contained in this packet.  Follow-up instructions: Please follow-up with your primary care provider in 3 days if symptoms are not resolved for further evaluation of your symptoms.  Return instructions:   Please return to the Emergency Department if you experience worsening symptoms.   Return with fever, worsening pain, persistent vomiting, worsening pain in your back.   Please return if you have any other emergent concerns.  Additional Information:  Your vital signs today were: BP (!) 141/87 (BP Location: Left Arm)    Pulse 65    Temp 98 F (36.7 C) (Oral)    Resp 17    SpO2 97%  If your blood pressure (BP) was elevated above 135/85 this visit, please have this repeated by your doctor within one month. --------------

## 2019-01-17 NOTE — ED Triage Notes (Signed)
Patient c/o dysuria since last night. Reports hx bladder cancer. States surgery for tumor removal x1 week ago.

## 2019-01-19 LAB — URINE CULTURE: Culture: NO GROWTH

## 2020-01-04 ENCOUNTER — Emergency Department (HOSPITAL_COMMUNITY)
Admission: EM | Admit: 2020-01-04 | Discharge: 2020-01-04 | Disposition: A | Discharge status: Home / Self Care | Payer: Medicare Other | Attending: Emergency Medicine | Admitting: Emergency Medicine

## 2020-01-04 DIAGNOSIS — N390 Urinary tract infection, site not specified: Secondary | ICD-10-CM

## 2020-01-04 DIAGNOSIS — F17210 Nicotine dependence, cigarettes, uncomplicated: Secondary | ICD-10-CM | POA: Diagnosis not present

## 2020-01-04 DIAGNOSIS — Z79899 Other long term (current) drug therapy: Secondary | ICD-10-CM | POA: Insufficient documentation

## 2020-01-04 DIAGNOSIS — R339 Retention of urine, unspecified: Secondary | ICD-10-CM | POA: Diagnosis present

## 2020-01-04 LAB — COMPREHENSIVE METABOLIC PANEL
ALT: 18 U/L (ref 0–44)
AST: 27 U/L (ref 15–41)
Albumin: 4.2 g/dL (ref 3.5–5.0)
Alkaline Phosphatase: 58 U/L (ref 38–126)
Anion gap: 8 (ref 5–15)
BUN: 38 mg/dL — ABNORMAL HIGH (ref 8–23)
CO2: 21 mmol/L — ABNORMAL LOW (ref 22–32)
Calcium: 8.9 mg/dL (ref 8.9–10.3)
Chloride: 110 mmol/L (ref 98–111)
Creatinine, Ser: 1.65 mg/dL — ABNORMAL HIGH (ref 0.61–1.24)
GFR calc Af Amer: 48 mL/min — ABNORMAL LOW (ref >60)
GFR calc non Af Amer: 42 mL/min — ABNORMAL LOW (ref >60)
Glucose, Bld: 130 mg/dL — ABNORMAL HIGH (ref 70–99)
Potassium: 4.6 mmol/L (ref 3.5–5.1)
Sodium: 139 mmol/L (ref 135–145)
Total Bilirubin: 0.5 mg/dL (ref 0.3–1.2)
Total Protein: 7.4 g/dL (ref 6.5–8.1)

## 2020-01-04 LAB — CBC
HCT: 36.8 % — ABNORMAL LOW (ref 39.0–52.0)
Hemoglobin: 11.6 g/dL — ABNORMAL LOW (ref 13.0–17.0)
MCH: 29.2 pg (ref 26.0–34.0)
MCHC: 31.5 g/dL (ref 30.0–36.0)
MCV: 92.7 fL (ref 80.0–100.0)
Platelets: 294 10*3/uL (ref 150–400)
RBC: 3.97 MIL/uL — ABNORMAL LOW (ref 4.22–5.81)
RDW: 13.9 % (ref 11.5–15.5)
WBC: 10 10*3/uL (ref 4.0–10.5)
nRBC: 0 % (ref 0.0–0.2)

## 2020-01-04 LAB — URINALYSIS, ROUTINE W REFLEX MICROSCOPIC
Bilirubin Urine: NEGATIVE
Glucose, UA: NEGATIVE mg/dL
Ketones, ur: NEGATIVE mg/dL
Nitrite: NEGATIVE
Protein, ur: 100 mg/dL — AB
RBC / HPF: >50 RBC/hpf — ABNORMAL HIGH (ref 0–5)
Specific Gravity, Urine: 1.014 (ref 1.005–1.030)
WBC, UA: >50 WBC/hpf — ABNORMAL HIGH (ref 0–5)
pH: 5 (ref 5.0–8.0)

## 2020-01-04 LAB — LIPASE, BLOOD: Lipase: 19 U/L (ref 11–51)

## 2020-01-04 MED ORDER — SODIUM CHLORIDE 0.9% FLUSH: 3.0000 mL | Freq: Once | INTRAVENOUS | Status: DC

## 2020-01-04 MED ORDER — PHENAZOPYRIDINE HCL 200 MG PO TABS: 200.0000 mg | ORAL_TABLET | Freq: Three times a day (TID) | ORAL | 0 refills | Status: DC

## 2020-01-04 MED ORDER — HEPARIN SOD (PORK) LOCK FLUSH 100 UNIT/ML IV SOLN
500.0000 [IU] | Freq: Once | INTRAVENOUS | Status: AC
  Administered 2020-01-04: 500 [IU]
  Filled 2020-01-04: qty 5

## 2020-01-04 MED ORDER — CEPHALEXIN 500 MG PO CAPS: 500.0000 mg | ORAL_CAPSULE | Freq: Four times a day (QID) | ORAL | 0 refills | Status: DC

## 2020-01-04 MED ORDER — HYDROCODONE-ACETAMINOPHEN 5-325 MG PO TABS
1.0000 | ORAL_TABLET | Freq: Once | ORAL | Status: AC
  Administered 2020-01-04: 16:00:00 1 via ORAL
  Filled 2020-01-04: qty 1

## 2020-01-04 MED ORDER — PHENAZOPYRIDINE HCL 200 MG PO TABS
200.0000 mg | ORAL_TABLET | Freq: Three times a day (TID) | ORAL | Status: DC
  Administered 2020-01-04: 16:00:00 200 mg via ORAL
  Filled 2020-01-04: qty 1

## 2020-01-04 NOTE — ED Notes (Signed)
Spoke with pts family member and explained that pt would be returning home with foley catheter in place. Catheter care and drainage mechanism explained to both pt and family member who expressed understanding.

## 2020-01-04 NOTE — ED Provider Notes (Signed)
[facility] DEPT Provider Note   CSN: [id number] Arrival date & time: 01/04/20  1220     History Chief Complaint  Patient presents with  . Urinary Retention    Luis House is a 69 y.o. male.  69 year old male who presents with concern for penile pain as well as urinary urgency.  Patient has a history of bladder tumor and had a cystoscopy done he said several weeks ago and since that time he has had trouble with dysuria as well as urinary urgency.  He has had some suprapubic pressure but denies any fever or flank pain.  No hematuria noted.  Is here visiting relatives        Past Medical History:  Diagnosis Date  . Arthritis    wrist and knee  . Bladder tumor   . Cataract    right eye  . Collar bone fracture 2017   left    There are no problems to display for this patient.   Past Surgical History:  Procedure Laterality Date  . MULTIPLE TOOTH EXTRACTIONS    . TRANSURETHRAL RESECTION OF BLADDER TUMOR N/A 01/09/2019   Procedure: TRANSURETHRAL RESECTION OF BLADDER TUMOR (TURBT)/ POST OPERATIVE INSTILLATION OF CHEMOTHERAPY;  Surgeon: Kathie Rhodes, MD;  Location: [facility];  Service: Urology;  Laterality: N/A;       No family history on file.  Social History   Tobacco Use  . Smoking status: Current Every Day Smoker    Packs/day: 0.25    Years: 51.00    Pack years: 12.75    Types: Cigarettes  . Smokeless tobacco: Never Used  Substance Use Topics  . Alcohol use: Yes    Comment: 2 beers per day  . Drug use: Not on file    Home Medications Prior to Admission medications   Medication Sig Start Date End Date Taking? Authorizing Provider  CRANBERRY PO Take 1 tablet by mouth daily.   Yes [provider]  oxybutynin (DITROPAN-XL) 5 MG 24 hr tablet Take 5 mg by mouth daily. 12/27/19  Yes [provider]  tolterodine (DETROL LA) 2 MG 24 hr capsule Take 2 mg by mouth daily. 12/27/19  Yes [provider]    HYDROcodone-acetaminophen (NORCO) 10-325 MG tablet Take 1-2 tablets by mouth every 4 (four) hours as needed for moderate pain. Maximum dose per 24 hours - 8 pills Patient not taking: Reported on 01/04/2020 01/09/19   Kathie Rhodes, MD  nitrofurantoin, macrocrystal-monohydrate, (MACROBID) 100 MG capsule Take 1 capsule (100 mg total) by mouth 2 (two) times daily. Patient not taking: Reported on 01/04/2020 01/17/19   Carlisle Cater, PA-C  phenazopyridine (PYRIDIUM) 200 MG tablet Take 1 tablet (200 mg total) by mouth 3 (three) times daily as needed for pain. Patient not taking: Reported on 01/04/2020 01/09/19   Kathie Rhodes, MD    Allergies    Patient has no known allergies.  Review of Systems   Review of Systems  All other systems reviewed and are negative.   Physical Exam Updated Vital Signs BP (!) 141/82   Pulse 82   Temp 98 F (36.7 C) (Oral)   Resp 16   SpO2 100%   Physical Exam Vitals and nursing note reviewed.  Constitutional:      General: He is not in acute distress.    Appearance: Normal appearance. He is well-developed. He is not toxic-appearing.  HENT:     Head: Normocephalic and atraumatic.  Eyes:     General: Lids are normal.  Conjunctiva/sclera: Conjunctivae normal.     Pupils: Pupils are equal, round, and reactive to light.  Neck:     Thyroid: No thyroid mass.     Trachea: No tracheal deviation.  Cardiovascular:     Rate and Rhythm: Normal rate and regular rhythm.     Heart sounds: Normal heart sounds. No murmur. No gallop.   Pulmonary:     Effort: Pulmonary effort is normal. No respiratory distress.     Breath sounds: Normal breath sounds. No stridor. No decreased breath sounds, wheezing, rhonchi or rales.  Abdominal:     General: Bowel sounds are normal. There is no distension.     Palpations: Abdomen is soft.     Tenderness: There is no abdominal tenderness. There is no rebound.  Genitourinary:    Penis: Uncircumcised. No phimosis or paraphimosis.       Testes: Normal.  Musculoskeletal:        General: No tenderness. Normal range of motion.     Cervical back: Normal range of motion and neck supple.  Skin:    General: Skin is warm and dry.     Findings: No abrasion or rash.  Neurological:     Mental Status: He is alert and oriented to person, place, and time.     GCS: GCS eye subscore is 4. GCS verbal subscore is 5. GCS motor subscore is 6.     Cranial Nerves: No cranial nerve deficit.     Sensory: No sensory deficit.  Psychiatric:        Speech: Speech normal.        Behavior: Behavior normal.     ED Results / Procedures / Treatments   Labs (all labs ordered are listed, but only abnormal results are displayed) Labs Reviewed  COMPREHENSIVE METABOLIC PANEL - Abnormal; Notable for the following components:      Result Value   CO2 21 (*)    Glucose, Bld 130 (*)    BUN 38 (*)    Creatinine, Ser 1.65 (*)    GFR calc non Af Amer 42 (*)    GFR calc Af Amer 48 (*)    All other components within normal limits  CBC - Abnormal; Notable for the following components:   RBC 3.97 (*)    Hemoglobin 11.6 (*)    HCT 36.8 (*)    All other components within normal limits  LIPASE, BLOOD  URINALYSIS, ROUTINE W REFLEX MICROSCOPIC    EKG None  Radiology No results found.  Procedures Procedures (including critical care time)  Medications Ordered in ED Medications  sodium chloride flush (NS) 0.9 % injection 3 mL (3 mLs Intravenous Not Given 01/04/20 1257)    ED Course  I have reviewed the triage vital signs and the nursing notes.  Pertinent labs & imaging results that were available during my care of the patient were reviewed by me and considered in my medical decision making (see chart for details).    MDM Rules/Calculators/A&P                      Foley catheter placed and patient had pink-tinged urine which does show evidence of infection.  Laboratory study significant for increase in patient's creatinine.  Unsure what his  baseline is.  Patient to be placed on antibiotics here and given referral to urology. Final Clinical Impression(s) / ED Diagnoses Final diagnoses:  None    Rx / DC Orders ED Discharge Orders    None  Lacretia Leigh, MD 01/04/20 [phone]

## 2020-01-04 NOTE — ED Triage Notes (Signed)
Transported by [facility] from home-- c/o pain to penis and lower abdomen x 1-2 days. Hx of bladder cancer and actively receiving treatment. C/o urinary retention x 1 day. Recently had scope done 3 weeks ago and reports a few complications since procedure. VSS.

## 2020-01-06 ENCOUNTER — Emergency Department (HOSPITAL_COMMUNITY)
Admission: EM | Admit: 2020-01-06 | Discharge: 2020-01-06 | Disposition: A | Discharge status: Home / Self Care | Payer: Medicare Other | Attending: Emergency Medicine | Admitting: Emergency Medicine

## 2020-01-06 DIAGNOSIS — Z466 Encounter for fitting and adjustment of urinary device: Secondary | ICD-10-CM | POA: Diagnosis not present

## 2020-01-06 DIAGNOSIS — Z79899 Other long term (current) drug therapy: Secondary | ICD-10-CM | POA: Diagnosis not present

## 2020-01-06 DIAGNOSIS — Z8551 Personal history of malignant neoplasm of bladder: Secondary | ICD-10-CM | POA: Insufficient documentation

## 2020-01-06 DIAGNOSIS — F17210 Nicotine dependence, cigarettes, uncomplicated: Secondary | ICD-10-CM | POA: Diagnosis not present

## 2020-01-06 DIAGNOSIS — R300 Dysuria: Secondary | ICD-10-CM | POA: Insufficient documentation

## 2020-01-06 MED ORDER — MORPHINE SULFATE 15 MG PO TABS: 15.0000 mg | ORAL_TABLET | ORAL | 0 refills | Status: DC | PRN

## 2020-01-06 MED ORDER — OXYBUTYNIN CHLORIDE ER 10 MG PO TB24: 10.0000 mg | ORAL_TABLET | Freq: Every day | ORAL | 0 refills | Status: DC

## 2020-01-06 MED ORDER — KETOROLAC TROMETHAMINE 15 MG/ML IJ SOLN
15.0000 mg | Freq: Once | INTRAMUSCULAR | Status: AC
  Administered 2020-01-06: 15 mg via INTRAMUSCULAR
  Filled 2020-01-06: qty 1

## 2020-01-06 MED ORDER — ACETAMINOPHEN 500 MG PO TABS
1000.0000 mg | ORAL_TABLET | Freq: Once | ORAL | Status: AC
  Administered 2020-01-06: 1000 mg via ORAL
  Filled 2020-01-06: qty 2

## 2020-01-06 MED ORDER — OXYCODONE HCL 5 MG PO TABS
5.0000 mg | ORAL_TABLET | Freq: Once | ORAL | Status: AC
  Administered 2020-01-06: 5 mg via ORAL
  Filled 2020-01-06: qty 1

## 2020-01-06 NOTE — ED Triage Notes (Signed)
Transported by [facility] from home-- had foley catheter placed on 3/29, reports dysuria and lower abodminal pain today which is chronic due to history of bladder CA. Seen here at this facility on 3/29.

## 2020-01-06 NOTE — Discharge Instructions (Signed)
Take tylenol 1000mg (2 extra strength) four times a day.   Then take the pain medicine if you feel like you need it. Narcotics do not help with the pain, they only make you care about it less.  You can become addicted to this, people may break into your house to steal it.  It will constipate you.  If you drive under the influence of this medicine you can get a DUI.    Please return for worsening pain or fever.  Call your urologist and see if they can see in the office sooner.

## 2020-01-06 NOTE — ED Provider Notes (Signed)
[place] DEPT Provider Note   CSN: [id number] Arrival date & time: 01/06/20  1229     History Chief Complaint  Patient presents with  . Dysuria    Luis House is a 69 y.o. male.  69 yo M with a chief complaints of dysuria.  Patient came in a few days ago for the same.  Was found to be incompletely emptying his bladder and a Foley was placed.  Feels that the pain has persisted and may be worsened slightly.  He is here really for more pain medicine.  States that he has follow-up with the urologist but not till next week and wants pain medicine to make it until then.  Denies flank pain denies fever.  Denies any tugging or pulling in his catheter.  Thinks it has been draining.   Dysuria Presenting symptoms: dysuria   Associated symptoms: no abdominal pain, no diarrhea, no fever and no vomiting   Illness Severity:  Moderate Onset quality:  Gradual Duration:  2 days Timing:  Constant Progression:  Worsening Chronicity:  New Associated symptoms: no abdominal pain, no chest pain, no congestion, no diarrhea, no fever, no headaches, no myalgias, no rash, no shortness of breath and no vomiting        Past Medical History:  Diagnosis Date  . Arthritis    wrist and knee  . Bladder tumor   . Cataract    right eye  . Collar bone fracture 2017   left    There are no problems to display for this patient.   Past Surgical History:  Procedure Laterality Date  . MULTIPLE TOOTH EXTRACTIONS    . TRANSURETHRAL RESECTION OF BLADDER TUMOR N/A 01/09/2019   Procedure: TRANSURETHRAL RESECTION OF BLADDER TUMOR (TURBT)/ POST OPERATIVE INSTILLATION OF CHEMOTHERAPY;  Surgeon: Kathie Rhodes, MD;  Location: [facility];  Service: Urology;  Laterality: N/A;       No family history on file.  Social History   Tobacco Use  . Smoking status: Current Every Day Smoker    Packs/day: 0.25    Years: 51.00    Pack years: 12.75    Types: Cigarettes  . Smokeless tobacco:  Never Used  Substance Use Topics  . Alcohol use: Yes    Comment: 2 beers per day  . Drug use: Not on file    Home Medications Prior to Admission medications   Medication Sig Start Date End Date Taking? Authorizing Provider  cephALEXin (KEFLEX) 500 MG capsule Take 1 capsule (500 mg total) by mouth 4 (four) times daily. 01/04/20   Lacretia Leigh, MD  CRANBERRY PO Take 1 tablet by mouth daily.    [provider]  HYDROcodone-acetaminophen (NORCO) 10-325 MG tablet Take 1-2 tablets by mouth every 4 (four) hours as needed for moderate pain. Maximum dose per 24 hours - 8 pills Patient not taking: Reported on 01/04/2020 01/09/19   Kathie Rhodes, MD  morphine (MSIR) 15 MG tablet Take 1 tablet (15 mg total) by mouth every 4 (four) hours as needed for severe pain. 01/06/20   Deno Etienne, DO  nitrofurantoin, macrocrystal-monohydrate, (MACROBID) 100 MG capsule Take 1 capsule (100 mg total) by mouth 2 (two) times daily. Patient not taking: Reported on 01/04/2020 01/17/19   Carlisle Cater, PA-C  oxybutynin (DITROPAN XL) 10 MG 24 hr tablet Take 1 tablet (10 mg total) by mouth at bedtime. 01/06/20   Deno Etienne, DO  phenazopyridine (PYRIDIUM) 200 MG tablet Take 1 tablet (200 mg total) by mouth 3 (three)  times daily as needed for pain. Patient not taking: Reported on 01/04/2020 01/09/19   Kathie Rhodes, MD  phenazopyridine (PYRIDIUM) 200 MG tablet Take 1 tablet (200 mg total) by mouth 3 (three) times daily. 01/04/20   Lacretia Leigh, MD  tolterodine (DETROL LA) 2 MG 24 hr capsule Take 2 mg by mouth daily. 12/27/19   [provider]    Allergies    Patient has no known allergies.  Review of Systems   Review of Systems  Constitutional: Negative for chills and fever.  HENT: Negative for congestion and facial swelling.   Eyes: Negative for discharge and visual disturbance.  Respiratory: Negative for shortness of breath.   Cardiovascular: Negative for chest pain and palpitations.  Gastrointestinal:  Negative for abdominal pain, diarrhea and vomiting.  Genitourinary: Positive for dysuria.  Musculoskeletal: Negative for arthralgias and myalgias.  Skin: Negative for color change and rash.  Neurological: Negative for tremors, syncope and headaches.  Psychiatric/Behavioral: Negative for confusion and dysphoric mood.    Physical Exam Updated Vital Signs BP (!) 101/56   Pulse (!) 44   Temp 98 F (36.7 C)   Resp 14   SpO2 99%   Physical Exam Vitals and nursing note reviewed.  Constitutional:      Appearance: He is well-developed.  HENT:     Head: Normocephalic and atraumatic.  Eyes:     Pupils: Pupils are equal, round, and reactive to light.  Neck:     Vascular: No JVD.  Cardiovascular:     Rate and Rhythm: Normal rate and regular rhythm.     Heart sounds: No murmur. No friction rub. No gallop.   Pulmonary:     Effort: No respiratory distress.     Breath sounds: No wheezing.  Abdominal:     General: There is no distension.     Tenderness: There is no guarding or rebound.  Genitourinary:    Comments: Foley in place.  Appears to be draining normally.  No erythema to the penis.  No pain about the penis or in the suprapubic region. Musculoskeletal:        General: Normal range of motion.     Cervical back: Normal range of motion and neck supple.  Skin:    Coloration: Skin is not pale.     Findings: No rash.  Neurological:     Mental Status: He is alert and oriented to person, place, and time.  Psychiatric:        Behavior: Behavior normal.     ED Results / Procedures / Treatments   Labs (all labs ordered are listed, but only abnormal results are displayed) Labs Reviewed - No data to display  EKG None  Radiology No results found.  Procedures Procedures (including critical care time) Emergency Focused Ultrasound Exam Limited ultrasound of the Bladder.   Performed and interpreted by Dr. Tyrone Nine Indication: evaluation for urinary retention Transverse and  Sagittal views of the bladder are obtained and calculations are performed to determine an estimated bladder volume for signs of post-renal obstruction.  Findings: Bladder is 130 cc full Foley bulb in bladder Interpretation: no evidence of outlet obstruction Images archived electronically.  CPT Codes: [phone] and [phone]  Medications Ordered in ED Medications  acetaminophen (TYLENOL) tablet 1,000 mg (1,000 mg Oral Given 01/06/20 1259)  ketorolac (TORADOL) 15 MG/ML injection 15 mg (15 mg Intramuscular Given 01/06/20 1300)  oxyCODONE (Oxy IR/ROXICODONE) immediate release tablet 5 mg (5 mg Oral Given 01/06/20 1259)    ED Course  I have reviewed the triage vital signs and the nursing notes.  Pertinent labs & imaging results that were available during my care of the patient were reviewed by me and considered in my medical decision making (see chart for details).    MDM Rules/Calculators/A&P                      69 yo M with a chief complaints of dysuria.  Seen a couple days ago had a Foley catheter placed.  Patient has history of bladder cancer.  Has undergone chemo and radiation in [place].  He is having some worsening dysuria with the Foley in place.  Peers to be in place on my bedside ultrasound.  He does have some urine in the bladder despite having a Foley.  Will attempt irrigation.  Was able to irrigate with some discomfort.  Most likely it is in the right location.  I did offer to exchange it which he is declining.  I also offered to remove it and have him do a trial of void.  At this point the patient is requesting more pain medicine and is planning to follow-up with the urologist.  will have him return for worsening pain or fever.  2:58 PM:  I have discussed the diagnosis/risks/treatment options with the patient and believe the pt to be eligible for discharge home to follow-up with PCP. We also discussed returning to the ED immediately if new or worsening sx occur. We  discussed the sx which are most concerning (e.g., sudden worsening pain, fever, inability to tolerate by mouth) that necessitate immediate return. Medications administered to the patient during their visit and any new prescriptions provided to the patient are listed below.  Medications given during this visit Medications  acetaminophen (TYLENOL) tablet 1,000 mg (1,000 mg Oral Given 01/06/20 1259)  ketorolac (TORADOL) 15 MG/ML injection 15 mg (15 mg Intramuscular Given 01/06/20 1300)  oxyCODONE (Oxy IR/ROXICODONE) immediate release tablet 5 mg (5 mg Oral Given 01/06/20 1259)     The patient appears reasonably screen and/or stabilized for discharge and I doubt any other medical condition or other [facility] requiring further screening, evaluation, or treatment in the ED at this time prior to discharge.   Final Clinical Impression(s) / ED Diagnoses Final diagnoses:  Dysuria    Rx / DC Orders ED Discharge Orders         Ordered    oxybutynin (DITROPAN XL) 10 MG 24 hr tablet  Daily at bedtime     01/06/20 1424    morphine (MSIR) 15 MG tablet  Every 4 hours PRN     01/06/20 [place], Shields Pautz, DO 01/06/20 1458

## 2020-01-09 ENCOUNTER — Inpatient Hospital Stay (HOSPITAL_COMMUNITY)
Admission: EM | Admit: 2020-01-09 | Discharge: 2020-01-13 | DRG: 683 | Disposition: A | Discharge status: Home / Self Care | Payer: Medicare Other | Attending: Internal Medicine | Admitting: Internal Medicine

## 2020-01-09 ENCOUNTER — Encounter (HOSPITAL_COMMUNITY): Payer: Self-pay | Admitting: Emergency Medicine

## 2020-01-09 ENCOUNTER — Other Ambulatory Visit: Payer: Self-pay

## 2020-01-09 DIAGNOSIS — E860 Dehydration: Secondary | ICD-10-CM | POA: Diagnosis present

## 2020-01-09 DIAGNOSIS — R627 Adult failure to thrive: Secondary | ICD-10-CM | POA: Diagnosis present

## 2020-01-09 DIAGNOSIS — N390 Urinary tract infection, site not specified: Secondary | ICD-10-CM | POA: Diagnosis present

## 2020-01-09 DIAGNOSIS — C679 Malignant neoplasm of bladder, unspecified: Secondary | ICD-10-CM | POA: Diagnosis present

## 2020-01-09 DIAGNOSIS — Z6825 Body mass index (BMI) 25.0-25.9, adult: Secondary | ICD-10-CM

## 2020-01-09 DIAGNOSIS — Z720 Tobacco use: Secondary | ICD-10-CM | POA: Diagnosis present

## 2020-01-09 DIAGNOSIS — Z8551 Personal history of malignant neoplasm of bladder: Secondary | ICD-10-CM

## 2020-01-09 DIAGNOSIS — E875 Hyperkalemia: Secondary | ICD-10-CM | POA: Diagnosis present

## 2020-01-09 DIAGNOSIS — N179 Acute kidney failure, unspecified: Secondary | ICD-10-CM | POA: Diagnosis not present

## 2020-01-09 DIAGNOSIS — F17210 Nicotine dependence, cigarettes, uncomplicated: Secondary | ICD-10-CM | POA: Diagnosis present

## 2020-01-09 DIAGNOSIS — Z20822 Contact with and (suspected) exposure to covid-19: Secondary | ICD-10-CM | POA: Diagnosis present

## 2020-01-09 DIAGNOSIS — R64 Cachexia: Secondary | ICD-10-CM | POA: Diagnosis present

## 2020-01-09 LAB — CBC WITH DIFFERENTIAL/PLATELET
Abs Immature Granulocytes: 0.03 10*3/uL (ref 0.00–0.07)
Basophils Absolute: 0 10*3/uL (ref 0.0–0.1)
Basophils Relative: 1 %
Eosinophils Absolute: 0.2 10*3/uL (ref 0.0–0.5)
Eosinophils Relative: 3 %
HCT: 31.7 % — ABNORMAL LOW (ref 39.0–52.0)
Hemoglobin: 9.7 g/dL — ABNORMAL LOW (ref 13.0–17.0)
Immature Granulocytes: 0 %
Lymphocytes Relative: 8 %
Lymphs Abs: 0.6 10*3/uL — ABNORMAL LOW (ref 0.7–4.0)
MCH: 29 pg (ref 26.0–34.0)
MCHC: 30.6 g/dL (ref 30.0–36.0)
MCV: 94.6 fL (ref 80.0–100.0)
Monocytes Absolute: 0.7 10*3/uL (ref 0.1–1.0)
Monocytes Relative: 8 %
Neutro Abs: 6.4 10*3/uL (ref 1.7–7.7)
Neutrophils Relative %: 80 %
Platelets: 242 10*3/uL (ref 150–400)
RBC: 3.35 MIL/uL — ABNORMAL LOW (ref 4.22–5.81)
RDW: 14 % (ref 11.5–15.5)
WBC: 7.9 10*3/uL (ref 4.0–10.5)
nRBC: 0 % (ref 0.0–0.2)

## 2020-01-09 LAB — COMPREHENSIVE METABOLIC PANEL
ALT: 15 U/L (ref 0–44)
AST: 20 U/L (ref 15–41)
Albumin: 3.4 g/dL — ABNORMAL LOW (ref 3.5–5.0)
Alkaline Phosphatase: 46 U/L (ref 38–126)
Anion gap: 7 (ref 5–15)
BUN: 41 mg/dL — ABNORMAL HIGH (ref 8–23)
CO2: 18 mmol/L — ABNORMAL LOW (ref 22–32)
Calcium: 7.9 mg/dL — ABNORMAL LOW (ref 8.9–10.3)
Chloride: 115 mmol/L — ABNORMAL HIGH (ref 98–111)
Creatinine, Ser: 1.99 mg/dL — ABNORMAL HIGH (ref 0.61–1.24)
GFR calc Af Amer: 39 mL/min — ABNORMAL LOW (ref >60)
GFR calc non Af Amer: 33 mL/min — ABNORMAL LOW (ref >60)
Glucose, Bld: 88 mg/dL (ref 70–99)
Potassium: 5.1 mmol/L (ref 3.5–5.1)
Sodium: 140 mmol/L (ref 135–145)
Total Bilirubin: 0.6 mg/dL (ref 0.3–1.2)
Total Protein: 6.3 g/dL — ABNORMAL LOW (ref 6.5–8.1)

## 2020-01-09 LAB — URINALYSIS, ROUTINE W REFLEX MICROSCOPIC
Bilirubin Urine: NEGATIVE
Glucose, UA: NEGATIVE mg/dL
Ketones, ur: NEGATIVE mg/dL
Nitrite: NEGATIVE
Protein, ur: 30 mg/dL — AB
RBC / HPF: >50 RBC/hpf — ABNORMAL HIGH (ref 0–5)
Specific Gravity, Urine: 1.016 (ref 1.005–1.030)
pH: 5 (ref 5.0–8.0)

## 2020-01-09 MED ORDER — SODIUM CHLORIDE 0.9 % IV SOLN
1.0000 g | Freq: Once | INTRAVENOUS | Status: AC
  Administered 2020-01-09: 1 g via INTRAVENOUS
  Filled 2020-01-09: qty 10

## 2020-01-09 MED ORDER — SODIUM CHLORIDE 0.9 % IV BOLUS
1000.0000 mL | Freq: Once | INTRAVENOUS | Status: AC
  Administered 2020-01-09: 1000 mL via INTRAVENOUS

## 2020-01-09 NOTE — ED Notes (Signed)
Urinary bag emptied.

## 2020-01-09 NOTE — ED Notes (Signed)
Pt's urinary catheter bag emptied.

## 2020-01-09 NOTE — ED Notes (Addendum)
Bladder scanned x2: 28mL and 51mL

## 2020-01-09 NOTE — ED Notes (Signed)
Provided pt with sandwich and water.  Pt talking on his phone. Will check back with pt.

## 2020-01-09 NOTE — ED Provider Notes (Signed)
[place] DEPT Provider Note   CSN: [id number] Arrival date & time: 01/09/20  1730     History Chief Complaint  Patient presents with  . urinary catheter leaking    Luis House is a 69 y.o. male with PMH significant for bladder tumor s/p transurethral resection who presents to the ED via EMS with complaints of urinary catheter leakage and dysuria.  Patient was initially evaluated for urinary retention on 01/04/2020 and Foley catheter was placed which revealed pink-tinged urine consistent with infection.  Patient was placed antibiotics and referred to urology, [facility], for ongoing evaluation.  He was reevaluated in the ER 01/06/2020 for similar complaints and requesting additional pain medication.  His Foley appeared to be in place and draining normally.  There was some retained urine in the bladder despite Foley placement and irrigation was performed.  He was offered Foley replacement, which he declined.  He was discharged with oxybutynin and morphine every 4 hours as needed.  Patient's Foley bag was drained at time of arrival to ED and on my evaluation he had already collected an additional 200 cc urine.  Patient endorses worsening burning and discomfort with urination.  He has been taking his medications, as prescribed.  He also states that he is from [place] and has been taking some of his pain medication prescribed for other reasons.  However he states that because he takes "so many medications" he cannot tell me exactly what he is taking or confirm that he is still taking the Keflex, as prescribed.  He is endorsing subjective fevers and flank discomfort.  He continues to have suprapubic tenderness.  He states that his leakage typically occurs when he is defecating, but sometimes he wakes up after sleeping in his pants are wet.  He tried to call [facility] to move up his appointment, however they informed him that Tuesday is the earliest.  He  denies any BRBPR, melena, nausea vomiting, diminished appetite, chest pain or difficulty breathing, or other symptoms.   HPI     Past Medical History:  Diagnosis Date  . Arthritis    wrist and knee  . Bladder tumor   . Cataract    right eye  . Collar bone fracture 2017   left    There are no problems to display for this patient.   Past Surgical History:  Procedure Laterality Date  . MULTIPLE TOOTH EXTRACTIONS    . TRANSURETHRAL RESECTION OF BLADDER TUMOR N/A 01/09/2019   Procedure: TRANSURETHRAL RESECTION OF BLADDER TUMOR (TURBT)/ POST OPERATIVE INSTILLATION OF CHEMOTHERAPY;  Surgeon: Kathie Rhodes, MD;  Location: [facility];  Service: Urology;  Laterality: N/A;       No family history on file.  Social History   Tobacco Use  . Smoking status: Current Every Day Smoker    Packs/day: 0.25    Years: 51.00    Pack years: 12.75    Types: Cigarettes  . Smokeless tobacco: Never Used  Substance Use Topics  . Alcohol use: Yes    Comment: 2 beers per day  . Drug use: Not on file    Home Medications Prior to Admission medications   Medication Sig Start Date End Date Taking? Authorizing Provider  cephALEXin (KEFLEX) 500 MG capsule Take 1 capsule (500 mg total) by mouth 4 (four) times daily. 01/04/20   Lacretia Leigh, MD  CRANBERRY PO Take 1 tablet by mouth daily.    [provider]  HYDROcodone-acetaminophen (NORCO) 10-325 MG tablet Take 1-2  tablets by mouth every 4 (four) hours as needed for moderate pain. Maximum dose per 24 hours - 8 pills Patient not taking: Reported on 01/04/2020 01/09/19   Kathie Rhodes, MD  morphine (MSIR) 15 MG tablet Take 1 tablet (15 mg total) by mouth every 4 (four) hours as needed for severe pain. 01/06/20   Deno Etienne, DO  nitrofurantoin, macrocrystal-monohydrate, (MACROBID) 100 MG capsule Take 1 capsule (100 mg total) by mouth 2 (two) times daily. Patient not taking: Reported on 01/04/2020 01/17/19   Carlisle Cater, PA-C  oxybutynin (DITROPAN XL)  10 MG 24 hr tablet Take 1 tablet (10 mg total) by mouth at bedtime. 01/06/20   Deno Etienne, DO  phenazopyridine (PYRIDIUM) 200 MG tablet Take 1 tablet (200 mg total) by mouth 3 (three) times daily as needed for pain. Patient not taking: Reported on 01/04/2020 01/09/19   Kathie Rhodes, MD  phenazopyridine (PYRIDIUM) 200 MG tablet Take 1 tablet (200 mg total) by mouth 3 (three) times daily. 01/04/20   Lacretia Leigh, MD  tolterodine (DETROL LA) 2 MG 24 hr capsule Take 2 mg by mouth daily. 12/27/19   [provider]    Allergies    Patient has no known allergies.  Review of Systems   Review of Systems  Constitutional: Negative for chills.  Respiratory: Negative for shortness of breath.   Cardiovascular: Negative for chest pain.  Gastrointestinal: Positive for abdominal pain. Negative for nausea and vomiting.  Genitourinary: Positive for difficulty urinating, dysuria, flank pain, frequency, hematuria, penile pain and urgency.     Physical Exam Updated Vital Signs BP (!) 151/98 (BP Location: Right Arm)   Pulse 76   Temp 97.9 F (36.6 C) (Oral)   Resp 16   Ht 5\' 7"  (1.702 m)   Wt 72.6 kg   SpO2 100%   BMI 25.06 kg/m   Physical Exam Vitals and nursing note reviewed. Exam conducted with a chaperone present.  Constitutional:      Appearance: Normal appearance.  HENT:     Head: Normocephalic and atraumatic.  Eyes:     General: No scleral icterus.    Conjunctiva/sclera: Conjunctivae normal.  Cardiovascular:     Rate and Rhythm: Normal rate and regular rhythm.     Pulses: Normal pulses.     Heart sounds: Normal heart sounds.  Pulmonary:     Effort: Pulmonary effort is normal. No respiratory distress.     Breath sounds: Normal breath sounds.  Abdominal:     Comments: Soft, nondistended.  TTP over suprapubic region.  No TTP elsewhere.  No overlying skin changes.  Positive CVA tenderness bilaterally.  Musculoskeletal:     Cervical back: Normal range of motion. No rigidity.   Skin:    General: Skin is dry.  Neurological:     Mental Status: He is alert.     GCS: GCS eye subscore is 4. GCS verbal subscore is 5. GCS motor subscore is 6.  Psychiatric:        Mood and Affect: Mood normal.        Behavior: Behavior normal.        Thought Content: Thought content normal.      ED Results / Procedures / Treatments   Labs (all labs ordered are listed, but only abnormal results are displayed) Labs Reviewed  CBC WITH DIFFERENTIAL/PLATELET - Abnormal; Notable for the following components:      Result Value   RBC 3.35 (*)    Hemoglobin 9.7 (*)    HCT 31.7 (*)  Lymphs Abs 0.6 (*)    All other components within normal limits  URINALYSIS, ROUTINE W REFLEX MICROSCOPIC - Abnormal; Notable for the following components:   Hgb urine dipstick LARGE (*)    Protein, ur 30 (*)    Leukocytes,Ua MODERATE (*)    RBC / HPF >50 (*)    Bacteria, UA RARE (*)    All other components within normal limits  COMPREHENSIVE METABOLIC PANEL - Abnormal; Notable for the following components:   Chloride 115 (*)    CO2 18 (*)    BUN 41 (*)    Creatinine, Ser 1.99 (*)    Calcium 7.9 (*)    Total Protein 6.3 (*)    Albumin 3.4 (*)    GFR calc non Af Amer 33 (*)    GFR calc Af Amer 39 (*)    All other components within normal limits  URINE CULTURE  SARS CORONAVIRUS 2 (TAT 6-24 HRS)    EKG None  Radiology No results found.  Procedures Procedures (including critical care time)  Medications Ordered in ED Medications  cefTRIAXone (ROCEPHIN) 1 g in sodium chloride 0.9 % 100 mL IVPB (1 g Intravenous New Bag/Given 01/09/20 2349)  sodium chloride 0.9 % bolus 1,000 mL (1,000 mLs Intravenous New Bag/Given 01/09/20 2349)    ED Course  I have reviewed the triage vital signs and the nursing notes.  Pertinent labs & imaging results that were available during my care of the patient were reviewed by me and considered in my medical decision making (see chart for details).    MDM  Rules/Calculators/A&P                      On reevaluation, patient is resting comfortably.  I spoke with his friend who informs me that he no longer has his Keflex medication and she is not seeing any pain medications at their residence.  He was prescribed with a 10-day course on 01/04/2020 so he should not already be out of his medications.    UA demonstrates moderate leukocytes and large blood, greater than 50 RBCs.  Suspect that his hematuria is associated with his recently placed Foley catheter.  While he has moderate leukocytes, rare bacteria and low suspicion for UTI at present.  Will obtain urine culture nonetheless, particularly given his questionable compliance with his prescribed antibiotics.  CMP demonstrates worsening renal function compared to labs obtained 5 days ago, with creatinine increasing from 1.65-1.99 and GFR decreasing from 40-39.  BUN elevated at 41 which suggest that this could be 20:1 prerenal azotemia, but will obtain bladder scan.    Bladder scan does not demonstrate any retained urine.  On reevaluation, patient is still noting some dysuria and suprapubic discomfort.  However, he states that is improved from before ED presentation.  IV NS and IV Rocephin provided to patient.  Urine culture had already been collected.  Discussed case with Dr. Kathrynn Humble and patient to be admitted for his worsening renal function and in setting of Foley catheter placement s/p urinary retention with concomitant urinary tract infection.  COVID-19 testing collected.   Spoke with Dr. Jonelle Sidle who will evaluate and admit patient.   Final Clinical Impression(s) / ED Diagnoses Final diagnoses:  AKI (acute kidney injury) [facility])    Rx / [place] Orders ED Discharge Orders    None       Corena Herter, PA-C 01/09/20 [place], Ankit, MD 01/10/20 [phone]

## 2020-01-09 NOTE — ED Triage Notes (Signed)
PT to ED by [facility] with c/o of urinary catheter leakage and dysuria 10/10. Pt was seen here on 3/31 for similar issues with the catheter placed on 3/29.

## 2020-01-10 ENCOUNTER — Encounter (HOSPITAL_COMMUNITY): Payer: Self-pay | Admitting: Internal Medicine

## 2020-01-10 DIAGNOSIS — Z20822 Contact with and (suspected) exposure to covid-19: Secondary | ICD-10-CM | POA: Diagnosis present

## 2020-01-10 DIAGNOSIS — Z720 Tobacco use: Secondary | ICD-10-CM | POA: Diagnosis not present

## 2020-01-10 DIAGNOSIS — F17210 Nicotine dependence, cigarettes, uncomplicated: Secondary | ICD-10-CM | POA: Diagnosis present

## 2020-01-10 DIAGNOSIS — C679 Malignant neoplasm of bladder, unspecified: Secondary | ICD-10-CM | POA: Diagnosis not present

## 2020-01-10 DIAGNOSIS — R64 Cachexia: Secondary | ICD-10-CM | POA: Diagnosis present

## 2020-01-10 DIAGNOSIS — N179 Acute kidney failure, unspecified: Secondary | ICD-10-CM | POA: Diagnosis present

## 2020-01-10 DIAGNOSIS — E860 Dehydration: Secondary | ICD-10-CM | POA: Diagnosis present

## 2020-01-10 DIAGNOSIS — Z8551 Personal history of malignant neoplasm of bladder: Secondary | ICD-10-CM | POA: Diagnosis not present

## 2020-01-10 DIAGNOSIS — N3000 Acute cystitis without hematuria: Secondary | ICD-10-CM | POA: Diagnosis not present

## 2020-01-10 DIAGNOSIS — Z6825 Body mass index (BMI) 25.0-25.9, adult: Secondary | ICD-10-CM | POA: Diagnosis not present

## 2020-01-10 DIAGNOSIS — E875 Hyperkalemia: Secondary | ICD-10-CM | POA: Diagnosis present

## 2020-01-10 DIAGNOSIS — R627 Adult failure to thrive: Secondary | ICD-10-CM | POA: Diagnosis present

## 2020-01-10 DIAGNOSIS — N390 Urinary tract infection, site not specified: Secondary | ICD-10-CM | POA: Diagnosis present

## 2020-01-10 LAB — COMPREHENSIVE METABOLIC PANEL
ALT: 14 U/L (ref 0–44)
AST: 17 U/L (ref 15–41)
Albumin: 3.1 g/dL — ABNORMAL LOW (ref 3.5–5.0)
Alkaline Phosphatase: 44 U/L (ref 38–126)
Anion gap: 5 (ref 5–15)
BUN: 38 mg/dL — ABNORMAL HIGH (ref 8–23)
CO2: 23 mmol/L (ref 22–32)
Calcium: 8.4 mg/dL — ABNORMAL LOW (ref 8.9–10.3)
Chloride: 113 mmol/L — ABNORMAL HIGH (ref 98–111)
Creatinine, Ser: 2.03 mg/dL — ABNORMAL HIGH (ref 0.61–1.24)
GFR calc Af Amer: 38 mL/min — ABNORMAL LOW (ref >60)
GFR calc non Af Amer: 32 mL/min — ABNORMAL LOW (ref >60)
Glucose, Bld: 109 mg/dL — ABNORMAL HIGH (ref 70–99)
Potassium: 5.3 mmol/L — ABNORMAL HIGH (ref 3.5–5.1)
Sodium: 141 mmol/L (ref 135–145)
Total Bilirubin: 0.6 mg/dL (ref 0.3–1.2)
Total Protein: 6.1 g/dL — ABNORMAL LOW (ref 6.5–8.1)

## 2020-01-10 LAB — CBC
HCT: 32.2 % — ABNORMAL LOW (ref 39.0–52.0)
Hemoglobin: 10.3 g/dL — ABNORMAL LOW (ref 13.0–17.0)
MCH: 30 pg (ref 26.0–34.0)
MCHC: 32 g/dL (ref 30.0–36.0)
MCV: 93.9 fL (ref 80.0–100.0)
Platelets: 238 10*3/uL (ref 150–400)
RBC: 3.43 MIL/uL — ABNORMAL LOW (ref 4.22–5.81)
RDW: 13.9 % (ref 11.5–15.5)
WBC: 7.2 10*3/uL (ref 4.0–10.5)
nRBC: 0 % (ref 0.0–0.2)

## 2020-01-10 LAB — HIV ANTIBODY (ROUTINE TESTING W REFLEX): HIV Screen 4th Generation wRfx: NONREACTIVE

## 2020-01-10 LAB — SARS CORONAVIRUS 2 (TAT 6-24 HRS): SARS Coronavirus 2: NEGATIVE

## 2020-01-10 MED ORDER — SODIUM CHLORIDE 0.9% FLUSH
10.0000 mL | Freq: Two times a day (BID) | INTRAVENOUS | Status: DC
  Administered 2020-01-11 – 2020-01-13 (×3): 10 mL

## 2020-01-10 MED ORDER — OXYBUTYNIN CHLORIDE ER 5 MG PO TB24
10.0000 mg | ORAL_TABLET | Freq: Every day | ORAL | Status: DC
  Administered 2020-01-10 – 2020-01-12 (×4): 10 mg via ORAL
  Filled 2020-01-10 (×4): qty 2

## 2020-01-10 MED ORDER — SODIUM CHLORIDE 0.9 % IV SOLN
1.0000 g | INTRAVENOUS | Status: DC
  Administered 2020-01-10 – 2020-01-12 (×3): 1 g via INTRAVENOUS
  Filled 2020-01-10: qty 10
  Filled 2020-01-10: qty 1
  Filled 2020-01-10: qty 10
  Filled 2020-01-10: qty 1

## 2020-01-10 MED ORDER — ONDANSETRON HCL 4 MG/2ML IJ SOLN
4.0000 mg | Freq: Four times a day (QID) | INTRAMUSCULAR | Status: DC | PRN
  Administered 2020-01-11 (×2): 4 mg via INTRAVENOUS
  Filled 2020-01-10 (×2): qty 2

## 2020-01-10 MED ORDER — FESOTERODINE FUMARATE ER 4 MG PO TB24
4.0000 mg | ORAL_TABLET | Freq: Every day | ORAL | Status: DC
  Administered 2020-01-10 – 2020-01-13 (×4): 4 mg via ORAL
  Filled 2020-01-10 (×4): qty 1

## 2020-01-10 MED ORDER — DEXTROSE-NACL 5-0.9 % IV SOLN: INTRAVENOUS | Status: DC

## 2020-01-10 MED ORDER — ONDANSETRON HCL 4 MG PO TABS: 4.0000 mg | ORAL_TABLET | Freq: Four times a day (QID) | ORAL | Status: DC | PRN

## 2020-01-10 MED ORDER — ACETAMINOPHEN 325 MG PO TABS
650.0000 mg | ORAL_TABLET | Freq: Four times a day (QID) | ORAL | Status: DC | PRN
  Administered 2020-01-12: 650 mg via ORAL
  Filled 2020-01-10: qty 2

## 2020-01-10 MED ORDER — HEPARIN SODIUM (PORCINE) 5000 UNIT/ML IJ SOLN
5000.0000 [IU] | Freq: Three times a day (TID) | INTRAMUSCULAR | Status: DC
  Administered 2020-01-10 – 2020-01-13 (×10): 5000 [IU] via SUBCUTANEOUS
  Filled 2020-01-10 (×10): qty 1

## 2020-01-10 MED ORDER — CHLORHEXIDINE GLUCONATE CLOTH 2 % EX PADS
6.0000 | MEDICATED_PAD | Freq: Every day | CUTANEOUS | Status: DC
  Administered 2020-01-10 – 2020-01-13 (×4): 6 via TOPICAL

## 2020-01-10 MED ORDER — NICOTINE 21 MG/24HR TD PT24
21.0000 mg | MEDICATED_PATCH | Freq: Every day | TRANSDERMAL | Status: DC
  Administered 2020-01-10 – 2020-01-13 (×4): 21 mg via TRANSDERMAL
  Filled 2020-01-10 (×4): qty 1

## 2020-01-10 MED ORDER — ACETAMINOPHEN 650 MG RE SUPP: 650.0000 mg | Freq: Four times a day (QID) | RECTAL | Status: DC | PRN

## 2020-01-10 MED ORDER — PHENAZOPYRIDINE HCL 200 MG PO TABS
200.0000 mg | ORAL_TABLET | Freq: Three times a day (TID) | ORAL | Status: DC
  Filled 2020-01-10: qty 1

## 2020-01-10 MED ORDER — MORPHINE SULFATE 15 MG PO TABS
15.0000 mg | ORAL_TABLET | ORAL | Status: DC | PRN
  Administered 2020-01-10 – 2020-01-12 (×5): 15 mg via ORAL
  Filled 2020-01-10 (×5): qty 1

## 2020-01-10 NOTE — H&P (Signed)
History and Physical   Luis House [id number] DOB: [birth year] DOA: 01/09/2020  Referring MD/NP/PA: Dr. Kathrynn Humble  PCP: Patient, No Pcp Per   Outpatient Specialists: [facility] urology  Patient coming from: Home  Chief Complaint: Dysuria  HPI: Luis House is a 69 y.o. male with medical history significant of bladder cancer status post TURP, recent UTI, tobacco abuse who was seen in the ER on March 29 with UTI return on March 31 with the same complaint was placed on Keflex urine cultures obtained.  Patient has indwelling catheter.  He came back again today complaining of worsening symptoms.  Not eating or drinking adequately.  Patient noted to have worsening renal function and still urinalysis consistent with ongoing UTI.  Not sure if he took his Keflex but patient insists he was taking it.  Is being admitted with recurrent UTI failing outpatient treatment as well as acute kidney injury.  He appears dehydrated and malnourished.  No nausea vomiting or diarrhea.  Denied any hematuria..  ED Course: Temperature 97.9 blood pressure 175/71 pulse 89 respirate of 17 oxygen sat 96% on room air.  White count is 7.9 hemoglobin 9.7 and platelets 242.  Sodium 140 potassium 5.1 chloride 115 CO2 of 18 BUN 41 creatinine 1.9 and calcium 7.9.  Glucose is 88.  Urinalysis has white count of 21-50.  Review of Systems: As per HPI otherwise 10 point review of systems negative.    Past Medical History:  Diagnosis Date  . Arthritis    wrist and knee  . Bladder tumor   . Cataract    right eye  . Collar bone fracture 2017   left    Past Surgical History:  Procedure Laterality Date  . MULTIPLE TOOTH EXTRACTIONS    . TRANSURETHRAL RESECTION OF BLADDER TUMOR N/A 01/09/2019   Procedure: TRANSURETHRAL RESECTION OF BLADDER TUMOR (TURBT)/ POST OPERATIVE INSTILLATION OF CHEMOTHERAPY;  Surgeon: Kathie Rhodes, MD;  Location: [facility] OR[facility];  Service: Urology;  Laterality: N/A;     reports that he has been smoking  cigarettes. He has a 12.75 pack-year smoking history. He has never used smokeless tobacco. He reports current alcohol use. No history on file for drug.  No Known Allergies  No family history on file.   Prior to Admission medications   Medication Sig Start Date End Date Taking? Authorizing Provider  cephALEXin (KEFLEX) 500 MG capsule Take 1 capsule (500 mg total) by mouth 4 (four) times daily. 01/04/20   Lacretia Leigh, MD  CRANBERRY PO Take 1 tablet by mouth daily.    [provider]  HYDROcodone-acetaminophen (NORCO) 10-325 MG tablet Take 1-2 tablets by mouth every 4 (four) hours as needed for moderate pain. Maximum dose per 24 hours - 8 pills Patient not taking: Reported on 01/04/2020 01/09/19   Kathie Rhodes, MD  morphine (MSIR) 15 MG tablet Take 1 tablet (15 mg total) by mouth every 4 (four) hours as needed for severe pain. 01/06/20   Deno Etienne, DO  nitrofurantoin, macrocrystal-monohydrate, (MACROBID) 100 MG capsule Take 1 capsule (100 mg total) by mouth 2 (two) times daily. Patient not taking: Reported on 01/04/2020 01/17/19   Carlisle Cater, PA-C  oxybutynin (DITROPAN XL) 10 MG 24 hr tablet Take 1 tablet (10 mg total) by mouth at bedtime. 01/06/20   Deno Etienne, DO  phenazopyridine (PYRIDIUM) 200 MG tablet Take 1 tablet (200 mg total) by mouth 3 (three) times daily as needed for pain. Patient not taking: Reported on 01/04/2020 01/09/19   Kathie Rhodes, MD  phenazopyridine (PYRIDIUM)  200 MG tablet Take 1 tablet (200 mg total) by mouth 3 (three) times daily. 01/04/20   Lacretia Leigh, MD  tolterodine (DETROL LA) 2 MG 24 hr capsule Take 2 mg by mouth daily. 12/27/19   [provider]    Physical Exam: Vitals:   01/09/20 2200 01/09/20 2230 01/09/20 2300 01/10/20 0000  BP: 137/73 131/89 (!) 151/98 132/86  Pulse: (!) 50 70 76 87  Resp: 16 15 16 15   Temp:      TempSrc:      SpO2: 99% 100% 100% 100%  Weight:      Height:          Constitutional: Chronically ill looking and  cachectic Vitals:   01/09/20 2200 01/09/20 2230 01/09/20 2300 01/10/20 0000  BP: 137/73 131/89 (!) 151/98 132/86  Pulse: (!) 50 70 76 87  Resp: 16 15 16 15   Temp:      TempSrc:      SpO2: 99% 100% 100% 100%  Weight:      Height:       Eyes: PERRL, lids and conjunctivae normal ENMT: Mucous membranes are dry. Posterior pharynx clear of any exudate or lesions.Normal dentition.  Neck: normal, supple, no masses, no thyromegaly Respiratory: clear to auscultation bilaterally, no wheezing, no crackles. Normal respiratory effort. No accessory muscle use.  Cardiovascular: Regular rate and rhythm, no murmurs / rubs / gallops. No extremity edema. 2+ pedal pulses. No carotid bruits.  Abdomen: no tenderness, no masses palpated. No hepatosplenomegaly. Bowel sounds positive.  Foley catheter in place with urine bag showing concentrated urine Musculoskeletal: no clubbing / cyanosis. No joint deformity upper and lower extremities. Good ROM, no contractures. Normal muscle tone.  Skin: no rashes, lesions, ulcers. No induration Neurologic: CN 2-12 grossly intact. Sensation intact, DTR normal. Strength 5/5 in all 4.  Psychiatric: Normal judgment and insight. Alert and oriented x 3. Normal mood.     Labs on Admission: I have personally reviewed following labs and imaging studies  CBC: Recent Labs  Lab 01/04/20 1259 01/09/20 1905  WBC 10.0 7.9  NEUTROABS  --  6.4  HGB 11.6* 9.7*  HCT 36.8* 31.7*  MCV 92.7 94.6  PLT 294 [id number]   Basic Metabolic Panel: Recent Labs  Lab 01/04/20 1259 01/09/20 1905  NA 139 140  K 4.6 5.1  CL 110 115*  CO2 21* 18*  GLUCOSE 130* 88  BUN 38* 41*  CREATININE 1.65* 1.99*  CALCIUM 8.9 7.9*   GFR: Estimated Creatinine Clearance: 32.8 mL/min (A) (by C-G formula based on SCr of 1.99 mg/dL (H)). Liver Function Tests: Recent Labs  Lab 01/04/20 1259 01/09/20 1905  AST 27 20  ALT 18 15  ALKPHOS 58 46  BILITOT 0.5 0.6  PROT 7.4 6.3*  ALBUMIN 4.2 3.4*   Recent  Labs  Lab 01/04/20 1259  LIPASE 19   No results for input(s): AMMONIA in the last 168 hours. Coagulation Profile: No results for input(s): INR, PROTIME in the last 168 hours. Cardiac Enzymes: No results for input(s): CKTOTAL, CKMB, CKMBINDEX, TROPONINI in the last 168 hours. BNP (last 3 results) No results for input(s): PROBNP in the last 8760 hours. HbA1C: No results for input(s): HGBA1C in the last 72 hours. CBG: No results for input(s): GLUCAP in the last 168 hours. Lipid Profile: No results for input(s): CHOL, HDL, LDLCALC, TRIG, CHOLHDL, LDLDIRECT in the last 72 hours. Thyroid Function Tests: No results for input(s): TSH, T4TOTAL, FREET4, T3FREE, THYROIDAB in the last 72 hours. Anemia Panel:  No results for input(s): VITAMINB12, FOLATE, FERRITIN, TIBC, IRON, RETICCTPCT in the last 72 hours. Urine analysis:    Component Value Date/Time   COLORURINE YELLOW 01/09/2020 1905   APPEARANCEUR CLEAR 01/09/2020 1905   LABSPEC 1.016 01/09/2020 1905   PHURINE 5.0 01/09/2020 1905   GLUCOSEU NEGATIVE 01/09/2020 1905   HGBUR LARGE (A) 01/09/2020 [place] 01/09/2020 [place] 01/09/2020 1905   PROTEINUR 30 (A) 01/09/2020 1905   NITRITE NEGATIVE 01/09/2020 1905   LEUKOCYTESUR MODERATE (A) 01/09/2020 1905   Sepsis Labs: @LABRCNTIP (procalcitonin:4,lacticidven:4) )No results found for this or any previous visit (from the past 240 hour(s)).   Radiological Exams on Admission: No results found.   Assessment/Plan Principal Problem:   UTI (urinary tract infection) Active Problems:   ARF (acute renal failure) (HCC)   Tobacco abuse   Bladder cancer ([place])     #1 UTI: Appears to have failed outpatient treatment.  Urine cultures obtained today.  Empirically start on Rocephin.  IV antibiotic and supportive care.  Patient will be discharged home when stable.  At this point he is afebrile.  #2 acute kidney injury: Most likely prerenal.  Will consider  renal ultrasound but patient's bladder scan showed no urine in the bladder and no evidence of obstruction at least below the bladder.  Hydrate the patient and monitor renal function  #3 tobacco abuse: Tobacco cessation counseling with nicotine patch  #4 history of bladder cancer: Patient is being seen by urology.  Continue care   DVT prophylaxis: Heparin Code Status: Full code Family Communication: No family at bedside Disposition Plan: Home Consults called: None Admission status: Inpatient  Severity of Illness: The appropriate patient status for this patient is INPATIENT. Inpatient status is judged to be reasonable and necessary in order to provide the required intensity of service to ensure the patient's safety. The patient's presenting symptoms, physical exam findings, and initial radiographic and laboratory data in the context of their chronic comorbidities is felt to place them at high risk for further clinical deterioration. Furthermore, it is not anticipated that the patient will be medically stable for discharge from the hospital within 2 midnights of admission. The following factors support the patient status of inpatient.   " The patient's presenting symptoms include dysuria. " The worrisome physical exam findings include cachexia. " The initial radiographic and laboratory data are worrisome because of evidence of acute kidney injury. " The chronic co-morbidities include history of bladder cancer.   * I certify that at the point of admission it is my clinical judgment that the patient will require inpatient hospital care spanning beyond 2 midnights from the point of admission due to high intensity of service, high risk for further deterioration and high frequency of surveillance required.Barbette Merino MD [facility] Pager [phone]  If 7PM-7AM, please contact night-coverage [email or web address] Password [place] [facility] [place]  01/10/2020, 12:23 AM

## 2020-01-10 NOTE — Progress Notes (Signed)
   Vital Signs MEWS/VS Documentation      01/10/2020 0200 01/10/2020 0533 01/10/2020 0925 01/10/2020 0930   MEWS Score:  1  0  3  2   MEWS Score Color:  Green  Green  Yellow  Yellow   Resp:  --  19  12  18    Pulse:  --  72  (!) 30  (!) 37   BP:  --  136/81  108/66  --   Temp:  --  98 F (36.7 C)  97.9 F (36.6 C)  --   O2 Device:  --  --  Room Air  --   Level of Consciousness:  Alert  --  --  --    Pt alert and oriented. Pt calm and resting. Charge RN aware.       Alphonzo Lemmings Vaden 01/10/2020,9:57 AM

## 2020-01-10 NOTE — ED Notes (Signed)
Urinary bag emptied.

## 2020-01-10 NOTE — Progress Notes (Signed)
PROGRESS NOTE    Luis House  [id number] DOB: 1951-06-28 DOA: 01/09/2020 PCP: Patient, No Pcp Per   Brief Narrative:  Luis House is a 69 y.o. male with medical history significant of bladder cancer status post TURP, recent UTI, tobacco abuse who was seen in the ER on March 29 with UTI return on March 31 with the same complaint was placed on Keflex urine cultures obtained.  Patient has indwelling catheter.  He came back again today complaining of worsening symptoms. Not eating or drinking adequately.  Patient noted to have worsening renal function and still urinalysis consistent with ongoing UTI.  Not sure if he took his Keflex but patient insists he was taking it.  Is being admitted with recurrent UTI failing outpatient treatment as well as acute kidney injury.  He appears dehydrated and malnourished.  No nausea vomiting or diarrhea.  Denied any hematuria. In ED: Temperature 97.9 blood pressure 175/71 pulse 89 respirate of 17 oxygen sat 96% on room air.  White count is 7.9 hemoglobin 9.7 and platelets 242.  Sodium 140 potassium 5.1 chloride 115 CO2 of 18 BUN 41 creatinine 1.9 and calcium 7.9.  Glucose is 88.  Urinalysis has white count of 21-50.   Assessment & Plan:   Principal Problem:   UTI (urinary tract infection) Active Problems:   ARF (acute renal failure) (HCC)   Tobacco abuse   Bladder cancer (HCC)   UTI, failure of outpatient therapy, patient does not meet sepsis criteria, POA Placed on Keflex previously with worsening pain, symptoms Patient chronic indwelling Foley likely exacerbating his infection -exchanged 01/06/2020 per documentation Continue ceftriaxone  AKI without history of CKD with concurrent hyperkalemia Likely secondary to dehydration poor p.o. intake and failure to thrive  Continue IV fluids, potassium 5.3, follow clinically -hold off on treatment of acute mild hyperkalemia without symptoms  Tobacco abuse Lengthy discussion about need for  cessation  Bladder cancer, history of - urology following in the outpatient setting -no current indication for consult   DVT prophylaxis: Heparin Code Status: Full Family Communication: None available Disposition Plan: Inpatient, continues to require IV antibiotics in the setting of failure of outpatient p.o. antibiotics, likely need an additional 24 to 48 hours of treatment to ensure clearance of infection prior to disposition.   Consultants:   None  Procedures:   None  Antimicrobials:  Ceftriaxone  Subjective: No acute issues or events overnight, patient feels quite well somewhat still weak fatigued but denies nausea, vomiting, headache, fevers, chills.  Objective: Vitals:   01/10/20 0030 01/10/20 0100 01/10/20 0116 01/10/20 0533  BP: 130/90 (!) 150/62 (!) 165/78 136/81  Pulse: 87 (!) 45 (!) 41 72  Resp: 16 15 20 19   Temp:   98 F (36.7 C) 98 F (36.7 C)  TempSrc:   Oral Oral  SpO2: 100% 99% 99% 97%  Weight:   69.7 kg   Height:   5\' 7"  (1.702 m)     Intake/Output Summary (Last 24 hours) at 01/10/2020 0751 Last data filed at 01/10/2020 0600 Gross per 24 hour  Intake 2061.39 ml  Output 300 ml  Net 1761.39 ml   Filed Weights   01/09/20 1752 01/10/20 0116  Weight: 72.6 kg 69.7 kg    Examination:  General:  Pleasantly resting in bed, No acute distress. HEENT:  Normocephalic atraumatic.  Sclerae nonicteric, noninjected.  Extraocular movements intact bilaterally. Neck:  Without mass or deformity.  Trachea is midline. Lungs:  Clear to auscultate bilaterally without rhonchi, wheeze, or rales. Heart:  Regular rate and rhythm.  Without murmurs, rubs, or gallops. Abdomen:  Soft, nontender, nondistended.  Without guarding or rebound. Extremities: Without cyanosis, clubbing, edema, or obvious deformity. Vascular:  Dorsalis pedis and posterior tibial pulses palpable bilaterally. Skin:  Warm and dry, no erythema, no ulcerations.   Data Reviewed: I have personally  reviewed following labs and imaging studies  CBC: Recent Labs  Lab 01/04/20 1259 01/09/20 1905 01/10/20 0654  WBC 10.0 7.9 7.2  NEUTROABS  --  6.4  --   HGB 11.6* 9.7* 10.3*  HCT 36.8* 31.7* 32.2*  MCV 92.7 94.6 93.9  PLT 294 242 [id number]   Basic Metabolic Panel: Recent Labs  Lab 01/04/20 1259 01/09/20 1905 01/10/20 0654  NA 139 140 141  K 4.6 5.1 5.3*  CL 110 115* 113*  CO2 21* 18* 23  GLUCOSE 130* 88 109*  BUN 38* 41* 38*  CREATININE 1.65* 1.99* 2.03*  CALCIUM 8.9 7.9* 8.4*   GFR: Estimated Creatinine Clearance: 32.1 mL/min (A) (by C-G formula based on SCr of 2.03 mg/dL (H)). Liver Function Tests: Recent Labs  Lab 01/04/20 1259 01/09/20 1905 01/10/20 0654  AST 27 20 17   ALT 18 15 14   ALKPHOS 58 46 44  BILITOT 0.5 0.6 0.6  PROT 7.4 6.3* 6.1*  ALBUMIN 4.2 3.4* 3.1*   Recent Labs  Lab 01/04/20 1259  LIPASE 19   No results for input(s): AMMONIA in the last 168 hours. Coagulation Profile: No results for input(s): INR, PROTIME in the last 168 hours. Cardiac Enzymes: No results for input(s): CKTOTAL, CKMB, CKMBINDEX, TROPONINI in the last 168 hours. BNP (last 3 results) No results for input(s): PROBNP in the last 8760 hours. HbA1C: No results for input(s): HGBA1C in the last 72 hours. CBG: No results for input(s): GLUCAP in the last 168 hours. Lipid Profile: No results for input(s): CHOL, HDL, LDLCALC, TRIG, CHOLHDL, LDLDIRECT in the last 72 hours. Thyroid Function Tests: No results for input(s): TSH, T4TOTAL, FREET4, T3FREE, THYROIDAB in the last 72 hours. Anemia Panel: No results for input(s): VITAMINB12, FOLATE, FERRITIN, TIBC, IRON, RETICCTPCT in the last 72 hours. Sepsis Labs: No results for input(s): PROCALCITON, LATICACIDVEN in the last 168 hours.  No results found for this or any previous visit (from the past 240 hour(s)).   Radiology Studies: No results found.  Scheduled Meds: . Chlorhexidine Gluconate Cloth  6 each Topical Daily  .  fesoterodine  4 mg Oral Daily  . heparin  5,000 Units Subcutaneous Q8H  . nicotine  21 mg Transdermal Daily  . oxybutynin  10 mg Oral QHS  . phenazopyridine  200 mg Oral TID  . sodium chloride flush  10-40 mL Intracatheter Q12H   Continuous Infusions: . cefTRIAXone (ROCEPHIN)  IV    . dextrose 5 % and 0.9% NaCl 100 mL/hr at 01/10/20 0600     LOS: 0 days    Time spent: 57min  Celestia Duva C Karrissa Parchment, DO [facility]  If 7PM-7AM, please contact night-coverage [email or web address]  01/10/2020, 7:51 AM

## 2020-01-11 LAB — URINE CULTURE: Culture: NO GROWTH

## 2020-01-11 LAB — CBC
HCT: 33 % — ABNORMAL LOW (ref 39.0–52.0)
Hemoglobin: 10.2 g/dL — ABNORMAL LOW (ref 13.0–17.0)
MCH: 29.1 pg (ref 26.0–34.0)
MCHC: 30.9 g/dL (ref 30.0–36.0)
MCV: 94 fL (ref 80.0–100.0)
Platelets: 238 10*3/uL (ref 150–400)
RBC: 3.51 MIL/uL — ABNORMAL LOW (ref 4.22–5.81)
RDW: 13.8 % (ref 11.5–15.5)
WBC: 7.7 10*3/uL (ref 4.0–10.5)
nRBC: 0 % (ref 0.0–0.2)

## 2020-01-11 LAB — BASIC METABOLIC PANEL
Anion gap: 6 (ref 5–15)
BUN: 30 mg/dL — ABNORMAL HIGH (ref 8–23)
CO2: 22 mmol/L (ref 22–32)
Calcium: 8.3 mg/dL — ABNORMAL LOW (ref 8.9–10.3)
Chloride: 116 mmol/L — ABNORMAL HIGH (ref 98–111)
Creatinine, Ser: 1.89 mg/dL — ABNORMAL HIGH (ref 0.61–1.24)
GFR calc Af Amer: 41 mL/min — ABNORMAL LOW (ref >60)
GFR calc non Af Amer: 35 mL/min — ABNORMAL LOW (ref >60)
Glucose, Bld: 121 mg/dL — ABNORMAL HIGH (ref 70–99)
Potassium: 5.5 mmol/L — ABNORMAL HIGH (ref 3.5–5.1)
Sodium: 144 mmol/L (ref 135–145)

## 2020-01-11 MED ORDER — SODIUM ZIRCONIUM CYCLOSILICATE 5 G PO PACK
5.0000 g | PACK | Freq: Once | ORAL | Status: AC
  Administered 2020-01-11: 5 g via ORAL
  Filled 2020-01-11: qty 1

## 2020-01-11 MED ORDER — PEG 3350-KCL-NA BICARB-NACL 420 G PO SOLR
4000.0000 mL | Freq: Once | ORAL | Status: AC
  Administered 2020-01-11: 4000 mL via ORAL

## 2020-01-11 MED ORDER — POLYETHYLENE GLYCOL 3350 17 G PO PACK
17.0000 g | PACK | Freq: Two times a day (BID) | ORAL | Status: DC
  Administered 2020-01-11: 17 g via ORAL
  Filled 2020-01-11: qty 1

## 2020-01-11 NOTE — Progress Notes (Signed)
Patient family reports and shows prof that patient received cath during hospital admission from 01/04/20.

## 2020-01-11 NOTE — Progress Notes (Signed)
PROGRESS NOTE    Luis House  [id number] DOB: 1950-10-25 DOA: 01/09/2020 PCP: Patient, No Pcp Per   Brief Narrative:  Jibriel House is a 69 y.o. male with medical history significant of bladder cancer status post TURP, recent UTI, tobacco abuse who was seen in the ER on March 29 with UTI return on March 31 with the same complaint was placed on Keflex urine cultures obtained.  Patient has indwelling catheter.  He came back again today complaining of worsening symptoms. Not eating or drinking adequately.  Patient noted to have worsening renal function and still urinalysis consistent with ongoing UTI.  Not sure if he took his Keflex but patient insists he was taking it.  Is being admitted with recurrent UTI failing outpatient treatment as well as acute kidney injury.  He appears dehydrated and malnourished.  No nausea vomiting or diarrhea.  Denied any hematuria. In ED: Temperature 97.9 blood pressure 175/71 pulse 89 respirate of 17 oxygen sat 96% on room air.  White count is 7.9 hemoglobin 9.7 and platelets 242.  Sodium 140 potassium 5.1 chloride 115 CO2 of 18 BUN 41 creatinine 1.9 and calcium 7.9.  Glucose is 88.  Urinalysis has white count of 21-50.  Assessment & Plan:   Principal Problem:   UTI (urinary tract infection) Active Problems:   ARF (acute renal failure) (HCC)   Tobacco abuse   Bladder cancer (HCC)   UTI, failure of outpatient therapy, patient does not meet sepsis criteria, POA Placed on Keflex previously with worsening pain, symptoms Patient chronic indwelling Foley likely exacerbating his infection - exchanged 01/06/2020 per documentation Continue ceftriaxone for 5 day minimum given chronic foley and failure of outpatient therapy - will likely need outpatient follow up with urology  AKI without history of CKD with concurrent hyperkalemia Likely secondary to dehydration poor p.o. intake and failure to thrive  Lokelma 5mg  x1 dose 4/5 given K 5.5 and climbing over the past 3  days despite fluids Lab Results  Component Value Date   CREATININE 1.89 (H) 01/11/2020   CREATININE 2.03 (H) 01/10/2020   CREATININE 1.99 (H) 01/09/2020   Tobacco abuse Lengthy discussion about need for cessation  Bladder cancer, history of - urology following in the outpatient setting -no current indication for consult   DVT prophylaxis: Heparin Code Status: Full Family Communication: None available Disposition Plan: Inpatient, continues to require IV antibiotics in the setting of failure of outpatient p.o. antibiotics, likely need an additional 48-72 hours of treatment to ensure clearance of infection prior to disposition.   Consultants:   None  Procedures:   None  Antimicrobials:  Ceftriaxone  Subjective: No acute issues or events overnight, patient complains about ongoing constipation but otherwise feels quite well somewhat still weak/fatigued but denies nausea, vomiting, headache, fevers, chills.  Objective: Vitals:   01/10/20 1359 01/10/20 1728 01/10/20 2104 01/11/20 0506  BP: 126/69 (!) 152/95 (!) 147/85 (!) 165/79  Pulse: (!) 58 81 67 66  Resp: 20 14 20 20   Temp: 98.2 F (36.8 C) 97.8 F (36.6 C) 98 F (36.7 C) 98.4 F (36.9 C)  TempSrc:      SpO2: 99% 99% 100% 99%  Weight:      Height:        Intake/Output Summary (Last 24 hours) at 01/11/2020 0729 Last data filed at 01/11/2020 0500 Gross per 24 hour  Intake 1347.48 ml  Output 4650 ml  Net -3302.52 ml   Filed Weights   01/09/20 1752 01/10/20 0116  Weight: 72.6 kg  69.7 kg    Examination:  General:  Pleasantly resting in bed, No acute distress. HEENT:  Normocephalic atraumatic.  Sclerae nonicteric, noninjected.  Extraocular movements intact bilaterally. Neck:  Without mass or deformity.  Trachea is midline. Lungs:  Clear to auscultate bilaterally without rhonchi, wheeze, or rales. Heart:  Regular rate and rhythm.  Without murmurs, rubs, or gallops. Abdomen:  Soft, nontender, nondistended.   Without guarding or rebound. Extremities: Without cyanosis, clubbing, edema, or obvious deformity. Vascular:  Dorsalis pedis and posterior tibial pulses palpable bilaterally. Skin:  Warm and dry, no erythema, no ulcerations.   Data Reviewed: I have personally reviewed following labs and imaging studies  CBC: Recent Labs  Lab 01/04/20 1259 01/09/20 1905 01/10/20 0654 01/11/20 0455  WBC 10.0 7.9 7.2 7.7  NEUTROABS  --  6.4  --   --   HGB 11.6* 9.7* 10.3* 10.2*  HCT 36.8* 31.7* 32.2* 33.0*  MCV 92.7 94.6 93.9 94.0  PLT 294 242 238 [id number]   Basic Metabolic Panel: Recent Labs  Lab 01/04/20 1259 01/09/20 1905 01/10/20 0654 01/11/20 0455  NA 139 140 141 144  K 4.6 5.1 5.3* 5.5*  CL 110 115* 113* 116*  CO2 21* 18* 23 22  GLUCOSE 130* 88 109* 121*  BUN 38* 41* 38* 30*  CREATININE 1.65* 1.99* 2.03* 1.89*  CALCIUM 8.9 7.9* 8.4* 8.3*   GFR: Estimated Creatinine Clearance: 34.5 mL/min (A) (by C-G formula based on SCr of 1.89 mg/dL (H)). Liver Function Tests: Recent Labs  Lab 01/04/20 1259 01/09/20 1905 01/10/20 0654  AST 27 20 17   ALT 18 15 14   ALKPHOS 58 46 44  BILITOT 0.5 0.6 0.6  PROT 7.4 6.3* 6.1*  ALBUMIN 4.2 3.4* 3.1*   Recent Labs  Lab 01/04/20 1259  LIPASE 19   No results for input(s): AMMONIA in the last 168 hours. Coagulation Profile: No results for input(s): INR, PROTIME in the last 168 hours. Cardiac Enzymes: No results for input(s): CKTOTAL, CKMB, CKMBINDEX, TROPONINI in the last 168 hours. BNP (last 3 results) No results for input(s): PROBNP in the last 8760 hours. HbA1C: No results for input(s): HGBA1C in the last 72 hours. CBG: No results for input(s): GLUCAP in the last 168 hours. Lipid Profile: No results for input(s): CHOL, HDL, LDLCALC, TRIG, CHOLHDL, LDLDIRECT in the last 72 hours. Thyroid Function Tests: No results for input(s): TSH, T4TOTAL, FREET4, T3FREE, THYROIDAB in the last 72 hours. Anemia Panel: No results for input(s):  VITAMINB12, FOLATE, FERRITIN, TIBC, IRON, RETICCTPCT in the last 72 hours. Sepsis Labs: No results for input(s): PROCALCITON, LATICACIDVEN in the last 168 hours.  Recent Results (from the past 240 hour(s))  SARS CORONAVIRUS 2 (TAT 6-24 HRS) Nasopharyngeal Nasopharyngeal Swab     Status: None   Collection Time: 01/09/20 11:13 PM   Specimen: Nasopharyngeal Swab  Result Value Ref Range Status   SARS Coronavirus 2 NEGATIVE NEGATIVE Final    Comment: (NOTE) SARS-CoV-2 target nucleic acids are NOT DETECTED. The SARS-CoV-2 RNA is generally detectable in upper and lower respiratory specimens during the acute phase of infection. Negative results do not preclude SARS-CoV-2 infection, do not rule out co-infections with other pathogens, and should not be used as the sole basis for treatment or other patient management decisions. Negative results must be combined with clinical observations, patient history, and epidemiological information. The expected result is Negative. Fact Sheet for Patients: SugarRoll.be Fact Sheet for Healthcare Providers: https://www.woods-mathews.com/ This test is not yet approved or cleared by the [place]  FDA and  has been authorized for detection and/or diagnosis of SARS-CoV-2 by FDA under an Emergency Use Authorization (EUA). This EUA will remain  in effect (meaning this test can be used) for the duration of the COVID-19 declaration under Section 56 4(b)(1) of the Act, 21 U.S.C. section 360bbb-3(b)(1), unless the authorization is terminated or revoked sooner. Performed at [facility], [place] [address], [place], [place] [address]      Radiology Studies: No results found.  Scheduled Meds: . Chlorhexidine Gluconate Cloth  6 each Topical Daily  . fesoterodine  4 mg Oral Daily  . heparin  5,000 Units Subcutaneous Q8H  . nicotine  21 mg Transdermal Daily  . oxybutynin  10 mg Oral QHS  . sodium chloride flush   10-40 mL Intracatheter Q12H  . sodium zirconium cyclosilicate  5 g Oral Once   Continuous Infusions: . cefTRIAXone (ROCEPHIN)  IV 1 g (01/10/20 2229)  . dextrose 5 % and 0.9% NaCl 100 mL/hr at 01/10/20 1219     LOS: 1 day    Time spent: 57min  Lashannon Bresnan C Maddix Heinz, DO [facility]  If 7PM-7AM, please contact night-coverage [email or web address]  01/11/2020, 7:29 AM

## 2020-01-12 LAB — BASIC METABOLIC PANEL
Anion gap: 7 (ref 5–15)
BUN: 23 mg/dL (ref 8–23)
CO2: 25 mmol/L (ref 22–32)
Calcium: 8.4 mg/dL — ABNORMAL LOW (ref 8.9–10.3)
Chloride: 110 mmol/L (ref 98–111)
Creatinine, Ser: 1.83 mg/dL — ABNORMAL HIGH (ref 0.61–1.24)
GFR calc Af Amer: 43 mL/min — ABNORMAL LOW (ref >60)
GFR calc non Af Amer: 37 mL/min — ABNORMAL LOW (ref >60)
Glucose, Bld: 123 mg/dL — ABNORMAL HIGH (ref 70–99)
Potassium: 5.5 mmol/L — ABNORMAL HIGH (ref 3.5–5.1)
Sodium: 142 mmol/L (ref 135–145)

## 2020-01-12 LAB — CBC
HCT: 35 % — ABNORMAL LOW (ref 39.0–52.0)
Hemoglobin: 10.7 g/dL — ABNORMAL LOW (ref 13.0–17.0)
MCH: 28.6 pg (ref 26.0–34.0)
MCHC: 30.6 g/dL (ref 30.0–36.0)
MCV: 93.6 fL (ref 80.0–100.0)
Platelets: 252 10*3/uL (ref 150–400)
RBC: 3.74 MIL/uL — ABNORMAL LOW (ref 4.22–5.81)
RDW: 14.1 % (ref 11.5–15.5)
WBC: 8.4 10*3/uL (ref 4.0–10.5)
nRBC: 0 % (ref 0.0–0.2)

## 2020-01-12 NOTE — Progress Notes (Signed)
PROGRESS NOTE    Luis House  [id number] DOB: December 23, 1950 DOA: 01/09/2020 PCP: Patient, No Pcp Per   Brief Narrative:  Luis House is a 69 y.o. male with medical history significant of bladder cancer status post TURP, recent UTI, tobacco abuse who was seen in the ER on March 29 with UTI return on March 31 with the same complaint was placed on Keflex urine cultures obtained.  Patient has indwelling catheter.  He came back again today complaining of worsening symptoms. Not eating or drinking adequately.  Patient noted to have worsening renal function and still urinalysis consistent with ongoing UTI.  Not sure if he took his Keflex but patient insists he was taking it.  Is being admitted with recurrent UTI failing outpatient treatment as well as acute kidney injury.  He appears dehydrated and malnourished.  No nausea vomiting or diarrhea.  Denied any hematuria. In ED: Temperature 97.9 blood pressure 175/71 pulse 89 respirate of 17 oxygen sat 96% on room air.  White count is 7.9 hemoglobin 9.7 and platelets 242.  Sodium 140 potassium 5.1 chloride 115 CO2 of 18 BUN 41 creatinine 1.9 and calcium 7.9.  Glucose is 88.  Urinalysis has white count of 21-50.  Assessment & Plan:   Principal Problem:   UTI (urinary tract infection) Active Problems:   ARF (acute renal failure) (HCC)   Tobacco abuse   Bladder cancer (HCC)   UTI, failure of outpatient therapy, patient does not meet sepsis criteria, POA Placed on Keflex previously with worsening pain, symptoms Patient family contacted and confirmed patient DOES NOT have chronic indwelling Foley despite patient's discussion with myself and admitting physician - placed 3/29 Continue ceftriaxone - to complete 01/13/20  AKI without history of CKD with concurrent hyperkalemia, resolving Likely secondary to dehydration poor p.o. intake and failure to thrive  Lokelma 5mg  x1 dose 4/5 given K 5.5 and climbing over the past 3 days despite fluids Lab Results   Component Value Date   CREATININE 1.83 (H) 01/12/2020   CREATININE 1.89 (H) 01/11/2020   CREATININE 2.03 (H) 01/10/2020   Tobacco abuse Lengthy discussion about need for cessation  Bladder cancer, history of - urology following in the outpatient setting -no current indication for consult   DVT prophylaxis: Heparin Code Status: Full Family Communication: Updated over phone Disposition Plan: Inpatient, continues to require IV antibiotics in the setting of failure of outpatient p.o. antibiotics, likely need an additional 24-48 hours of treatment to ensure clearance of infection prior to disposition.   Consultants:   None  Procedures:   None  Antimicrobials:  Ceftriaxone  Subjective: No acute issues or events overnight, no longer reporting constipation, small bowel movement overnight and again this morning.  Denies nausea, vomiting, headache, fevers, chills..  Objective: Vitals:   01/11/20 1347 01/11/20 2030 01/12/20 0526 01/12/20 0645  BP: 114/67 134/87 (!) 169/95 140/83  Pulse: (!) 43 78 71 86  Resp:  16 16   Temp: 97.6 F (36.4 C) 98.5 F (36.9 C) 98.5 F (36.9 C)   TempSrc: Oral Oral Oral   SpO2: 98% 98% 95%   Weight:      Height:        Intake/Output Summary (Last 24 hours) at 01/12/2020 0729 Last data filed at 01/12/2020 0521 Gross per 24 hour  Intake 128 ml  Output 2550 ml  Net -2422 ml   Filed Weights   01/09/20 1752 01/10/20 0116  Weight: 72.6 kg 69.7 kg    Examination:  General:  Pleasantly resting  in bed, No acute distress. HEENT:  Normocephalic atraumatic.  Sclerae nonicteric, noninjected.  Extraocular movements intact bilaterally. Neck:  Without mass or deformity.  Trachea is midline. Lungs:  Clear to auscultate bilaterally without rhonchi, wheeze, or rales. Heart:  Regular rate and rhythm.  Without murmurs, rubs, or gallops. Abdomen:  Soft, nontender, nondistended.  Without guarding or rebound. Extremities: Without cyanosis, clubbing, edema,  or obvious deformity. Vascular:  Dorsalis pedis and posterior tibial pulses palpable bilaterally. Skin:  Warm and dry, no erythema, no ulcerations.   Data Reviewed: I have personally reviewed following labs and imaging studies  CBC: Recent Labs  Lab 01/09/20 1905 01/10/20 0654 01/11/20 0455 01/12/20 0410  WBC 7.9 7.2 7.7 8.4  NEUTROABS 6.4  --   --   --   HGB 9.7* 10.3* 10.2* 10.7*  HCT 31.7* 32.2* 33.0* 35.0*  MCV 94.6 93.9 94.0 93.6  PLT 242 238 238 [id number]   Basic Metabolic Panel: Recent Labs  Lab 01/09/20 1905 01/10/20 0654 01/11/20 0455 01/12/20 0410  NA 140 141 144 142  K 5.1 5.3* 5.5* 5.5*  CL 115* 113* 116* 110  CO2 18* 23 22 25   GLUCOSE 88 109* 121* 123*  BUN 41* 38* 30* 23  CREATININE 1.99* 2.03* 1.89* 1.83*  CALCIUM 7.9* 8.4* 8.3* 8.4*   GFR: Estimated Creatinine Clearance: 35.6 mL/min (A) (by C-G formula based on SCr of 1.83 mg/dL (H)). Liver Function Tests: Recent Labs  Lab 01/09/20 1905 01/10/20 0654  AST 20 17  ALT 15 14  ALKPHOS 46 44  BILITOT 0.6 0.6  PROT 6.3* 6.1*  ALBUMIN 3.4* 3.1*   No results for input(s): LIPASE, AMYLASE in the last 168 hours. No results for input(s): AMMONIA in the last 168 hours. Coagulation Profile: No results for input(s): INR, PROTIME in the last 168 hours. Cardiac Enzymes: No results for input(s): CKTOTAL, CKMB, CKMBINDEX, TROPONINI in the last 168 hours. BNP (last 3 results) No results for input(s): PROBNP in the last 8760 hours. HbA1C: No results for input(s): HGBA1C in the last 72 hours. CBG: No results for input(s): GLUCAP in the last 168 hours. Lipid Profile: No results for input(s): CHOL, HDL, LDLCALC, TRIG, CHOLHDL, LDLDIRECT in the last 72 hours. Thyroid Function Tests: No results for input(s): TSH, T4TOTAL, FREET4, T3FREE, THYROIDAB in the last 72 hours. Anemia Panel: No results for input(s): VITAMINB12, FOLATE, FERRITIN, TIBC, IRON, RETICCTPCT in the last 72 hours. Sepsis Labs: No results for  input(s): PROCALCITON, LATICACIDVEN in the last 168 hours.  Recent Results (from the past 240 hour(s))  Urine culture     Status: None   Collection Time: 01/09/20  7:05 PM   Specimen: Urine, Random  Result Value Ref Range Status   Specimen Description   Final    URINE, RANDOM Performed at [facility] [address], [place], [place] [address]    Special Requests   Final    NONE Performed at [facility], [place] [address], [place], [place] [address]    Culture   Final    NO GROWTH Performed at [facility], [place] [address] [place], [place] [address]    Report Status 01/11/2020 FINAL  Final  SARS CORONAVIRUS 2 (TAT 6-24 HRS) Nasopharyngeal Nasopharyngeal Swab     Status: None   Collection Time: 01/09/20 11:13 PM   Specimen: Nasopharyngeal Swab  Result Value Ref Range Status   SARS Coronavirus 2 NEGATIVE NEGATIVE Final    Comment: (NOTE) SARS-CoV-2 target nucleic acids  are NOT DETECTED. The SARS-CoV-2 RNA is generally detectable in upper and lower respiratory specimens during the acute phase of infection. Negative results do not preclude SARS-CoV-2 infection, do not rule out co-infections with other pathogens, and should not be used as the sole basis for treatment or other patient management decisions. Negative results must be combined with clinical observations, patient history, and epidemiological information. The expected result is Negative. Fact Sheet for Patients: SugarRoll.be Fact Sheet for Healthcare Providers: https://www.woods-mathews.com/ This test is not yet approved or cleared by the [place] FDA and  has been authorized for detection and/or diagnosis of SARS-CoV-2 by FDA under an Emergency Use Authorization (EUA). This EUA will remain  in effect (meaning this test can be used) for the duration of the COVID-19 declaration under Section 56 4(b)(1) of the Act, 21 U.S.C. section  360bbb-3(b)(1), unless the authorization is terminated or revoked sooner. Performed at [facility], [place] [address] [place], [place] [address]      Radiology Studies: No results found.  Scheduled Meds: . Chlorhexidine Gluconate Cloth  6 each Topical Daily  . fesoterodine  4 mg Oral Daily  . heparin  5,000 Units Subcutaneous Q8H  . nicotine  21 mg Transdermal Daily  . oxybutynin  10 mg Oral QHS  . sodium chloride flush  10-40 mL Intracatheter Q12H   Continuous Infusions: . cefTRIAXone (ROCEPHIN)  IV 1 g (01/11/20 2136)  . dextrose 5 % and 0.9% NaCl 100 mL/hr at 01/11/20 2136     LOS: 2 days    Time spent: 82min  Luis Dembeck C Janson Lamar, DO [facility]  If 7PM-7AM, please contact night-coverage [email or web address]  01/12/2020, 7:29 AM

## 2020-01-13 LAB — CBC
HCT: 31.5 % — ABNORMAL LOW (ref 39.0–52.0)
Hemoglobin: 9.6 g/dL — ABNORMAL LOW (ref 13.0–17.0)
MCH: 29.1 pg (ref 26.0–34.0)
MCHC: 30.5 g/dL (ref 30.0–36.0)
MCV: 95.5 fL (ref 80.0–100.0)
Platelets: 235 10*3/uL (ref 150–400)
RBC: 3.3 MIL/uL — ABNORMAL LOW (ref 4.22–5.81)
RDW: 13.9 % (ref 11.5–15.5)
WBC: 8.7 10*3/uL (ref 4.0–10.5)
nRBC: 0 % (ref 0.0–0.2)

## 2020-01-13 LAB — BASIC METABOLIC PANEL
Anion gap: 9 (ref 5–15)
BUN: 21 mg/dL (ref 8–23)
CO2: 22 mmol/L (ref 22–32)
Calcium: 8.2 mg/dL — ABNORMAL LOW (ref 8.9–10.3)
Chloride: 111 mmol/L (ref 98–111)
Creatinine, Ser: 1.93 mg/dL — ABNORMAL HIGH (ref 0.61–1.24)
GFR calc Af Amer: 40 mL/min — ABNORMAL LOW (ref >60)
GFR calc non Af Amer: 35 mL/min — ABNORMAL LOW (ref >60)
Glucose, Bld: 102 mg/dL — ABNORMAL HIGH (ref 70–99)
Potassium: 5 mmol/L (ref 3.5–5.1)
Sodium: 142 mmol/L (ref 135–145)

## 2020-01-13 MED ORDER — HEPARIN SOD (PORK) LOCK FLUSH 100 UNIT/ML IV SOLN
500.0000 [IU] | INTRAVENOUS | Status: AC | PRN
  Administered 2020-01-13: 500 [IU]
  Filled 2020-01-13: qty 5

## 2020-01-13 NOTE — Care Management Important Message (Signed)
Important Message  Patient Details IM Letter given to Roque Lias SW Case Manager to present to the Patient Name: Luis House MRN: [id number] Date of Birth: 23-Jan-1951   Medicare Important Message Given:  Yes     Kerin Salen 01/13/2020, 10:41 AM

## 2020-01-13 NOTE — Discharge Summary (Signed)
Physician Discharge Summary  Aldus Lopera [id number] DOB: [birth year] DOA: 01/09/2020  PCP: Patient, No Pcp Per  Admit date: 01/09/2020 Discharge date: 01/13/2020  Admitted From: Home Disposition: Home  Recommendations for Outpatient Follow-up:  1. Follow up with PCP in 1-2 weeks 2. Please obtain BMP/CBC in one week  Discharge Condition: Stable CODE STATUS: Full Diet recommendation: As tolerated  Brief/Interim Summary: Luis House a 69 y.o.malewith medical history significant ofbladder cancer status post TURP, recent UTI, tobacco abuse who was seen in the ER on March 29 with UTI return on March 31 with the same complaint was placed on Keflex urine cultures obtained. Patient has indwelling catheter. He came back again today complaining of worsening symptoms. Not eating or drinking adequately. Patient noted to have worsening renal function and still urinalysis consistent with ongoing UTI. Not sure if he took his Keflex but patient insists he was taking it. Is being admitted with recurrent UTI failing outpatient treatment as well as acute kidney injury. He appears dehydrated and malnourished. No nausea vomiting or diarrhea. Denied any hematuria. In [id number] 97.9 blood pressure 175/71 pulse 89 respirate of 17 oxygen sat 96% on room air. White count is 7.9 hemoglobin 9.7 and platelets 242. Sodium 140 potassium 5.1 chloride 115 CO2 of 18 BUN 41 creatinine 1.9 and calcium 7.9. Glucose is 88. Urinalysis has white count of 21-50.  Patient admitted as above with history of bladder cancer status post TURP with recurrent UTI and failure of outpatient therapy, patient remained in-house for a total of 5 days of IV antibiotic course given recent procedure and failure of previous outpatient antibiotics.  At this time patient has had Foley catheter removed, no issues urinating or passing stool, tolerating p.o. well, ambulating without any dysfunction and otherwise stable and agreeable for  discharge.  Patient will need close follow-up with PCP, oncology.  It appears he is attempting to move more locally but currently lives in [place], we recommended to continue seeing his current PCP oncology and urology teams until he can secure a local medical team as we do not want patient to miss any follow-up appointments or future treatments.  Discharge Diagnoses:  Principal Problem:   UTI (urinary tract infection) Active Problems:   ARF (acute renal failure) (HCC)   Tobacco abuse   Bladder cancer [facility])    Discharge Instructions  Discharge Instructions    Call MD for:  difficulty breathing, headache or visual disturbances   Complete by: As directed    Call MD for:  extreme fatigue   Complete by: As directed    Call MD for:  hives   Complete by: As directed    Call MD for:  persistant dizziness or light-headedness   Complete by: As directed    Call MD for:  persistant nausea and vomiting   Complete by: As directed    Call MD for:  severe uncontrolled pain   Complete by: As directed    Call MD for:  temperature >100.4   Complete by: As directed    Diet - low sodium heart healthy   Complete by: As directed    Increase activity slowly   Complete by: As directed      Allergies as of 01/13/2020   No Known Allergies     Medication List    STOP taking these medications   cephALEXin 500 MG capsule Commonly known as: KEFLEX   HYDROcodone-acetaminophen 10-325 MG tablet Commonly known as: NORCO   nitrofurantoin (macrocrystal-monohydrate) 100 MG capsule Commonly  known as: MACROBID     TAKE these medications   CRANBERRY PO Take 1 tablet by mouth daily.   morphine 15 MG tablet Commonly known as: MSIR Take 1 tablet (15 mg total) by mouth every 4 (four) hours as needed for severe pain.   oxybutynin 10 MG 24 hr tablet Commonly known as: Ditropan XL Take 1 tablet (10 mg total) by mouth at bedtime.   phenazopyridine 200 MG tablet Commonly known as:  PYRIDIUM Take 1 tablet (200 mg total) by mouth 3 (three) times daily. What changed: Another medication with the same name was removed. Continue taking this medication, and follow the directions you see here.   tolterodine 2 MG 24 hr capsule Commonly known as: DETROL LA Take 2 mg by mouth daily.       No Known Allergies  Procedures/Studies: No results found.  Subjective: No acute issues or events overnight, otherwise denies nausea, vomiting, diarrhea, constipation, headache, fevers, chills.   Discharge Exam: Vitals:   01/12/20 2021 01/13/20 0601  BP: (!) 147/82 (!) 149/93  Pulse: 62 69  Resp: 18 18  Temp: 98.5 F (36.9 C) 98.7 F (37.1 C)  SpO2: 96% 94%   Vitals:   01/12/20 0645 01/12/20 1349 01/12/20 2021 01/13/20 0601  BP: 140/83 (!) 147/96 (!) 147/82 (!) 149/93  Pulse: 86 (!) 109 62 69  Resp:  20 18 18   Temp:  (!) 97.5 F (36.4 C) 98.5 F (36.9 C) 98.7 F (37.1 C)  TempSrc:  Oral Oral Oral  SpO2:  100% 96% 94%  Weight:      Height:        General:  Pleasantly resting in bed, No acute distress. HEENT:  Normocephalic atraumatic.  Sclerae nonicteric, noninjected.  Extraocular movements intact bilaterally. Neck:  Without mass or deformity.  Trachea is midline. Lungs:  Clear to auscultate bilaterally without rhonchi, wheeze, or rales. Heart:  Regular rate and rhythm.  Without murmurs, rubs, or gallops. Abdomen:  Soft, nontender, nondistended.  Without guarding or rebound. Extremities: Without cyanosis, clubbing, edema, or obvious deformity. Vascular:  Dorsalis pedis and posterior tibial pulses palpable bilaterally. Skin:  Warm and dry, no erythema, no ulcerations.   The results of significant diagnostics from this hospitalization (including imaging, microbiology, ancillary and laboratory) are listed below for reference.     Microbiology: Recent Results (from the past 240 hour(s))  Urine culture     Status: None   Collection Time: 01/09/20  7:05 PM    Specimen: Urine, Random  Result Value Ref Range Status   Specimen Description   Final    URINE, RANDOM Performed at [facility] [address] [place], [place] [address]    Special Requests   Final    NONE Performed at [facility], [place] [address] [place], [place] [address]    Culture   Final    NO GROWTH Performed at [facility], [place] [address], [place], [place] [address]    Report Status 01/11/2020 FINAL  Final  SARS CORONAVIRUS 2 (TAT 6-24 HRS) Nasopharyngeal Nasopharyngeal Swab     Status: None   Collection Time: 01/09/20 11:13 PM   Specimen: Nasopharyngeal Swab  Result Value Ref Range Status   SARS Coronavirus 2 NEGATIVE NEGATIVE Final    Comment: (NOTE) SARS-CoV-2 target nucleic acids are NOT DETECTED. The SARS-CoV-2 RNA is generally detectable in upper and lower respiratory specimens during the acute phase of infection. Negative results do not preclude SARS-CoV-2 infection, do not  rule out co-infections with other pathogens, and should not be used as the sole basis for treatment or other patient management decisions. Negative results must be combined with clinical observations, patient history, and epidemiological information. The expected result is Negative. Fact Sheet for Patients: SugarRoll.be Fact Sheet for Healthcare Providers: https://www.woods-mathews.com/ This test is not yet approved or cleared by the [place] FDA and  has been authorized for detection and/or diagnosis of SARS-CoV-2 by FDA under an Emergency Use Authorization (EUA). This EUA will remain  in effect (meaning this test can be used) for the duration of the COVID-19 declaration under Section 56 4(b)(1) of the Act, 21 U.S.C. section 360bbb-3(b)(1), unless the authorization is terminated or revoked sooner. Performed at [facility], [place] [address], [place], [place] [address]      Labs: BNP (last  3 results) No results for input(s): BNP in the last 8760 hours. Basic Metabolic Panel: Recent Labs  Lab 01/09/20 1905 01/10/20 0654 01/11/20 0455 01/12/20 0410 01/13/20 0413  NA 140 141 144 142 142  K 5.1 5.3* 5.5* 5.5* 5.0  CL 115* 113* 116* 110 111  CO2 18* 23 22 25 22   GLUCOSE 88 109* 121* 123* 102*  BUN 41* 38* 30* 23 21  CREATININE 1.99* 2.03* 1.89* 1.83* 1.93*  CALCIUM 7.9* 8.4* 8.3* 8.4* 8.2*   Liver Function Tests: Recent Labs  Lab 01/09/20 1905 01/10/20 0654  AST 20 17  ALT 15 14  ALKPHOS 46 44  BILITOT 0.6 0.6  PROT 6.3* 6.1*  ALBUMIN 3.4* 3.1*   No results for input(s): LIPASE, AMYLASE in the last 168 hours. No results for input(s): AMMONIA in the last 168 hours. CBC: Recent Labs  Lab 01/09/20 1905 01/10/20 0654 01/11/20 0455 01/12/20 0410 01/13/20 0413  WBC 7.9 7.2 7.7 8.4 8.7  NEUTROABS 6.4  --   --   --   --   HGB 9.7* 10.3* 10.2* 10.7* 9.6*  HCT 31.7* 32.2* 33.0* 35.0* 31.5*  MCV 94.6 93.9 94.0 93.6 95.5  PLT 242 238 238 252 235   Cardiac Enzymes: No results for input(s): CKTOTAL, CKMB, CKMBINDEX, TROPONINI in the last 168 hours. BNP: Invalid input(s): POCBNP CBG: No results for input(s): GLUCAP in the last 168 hours. D-Dimer No results for input(s): DDIMER in the last 72 hours. Hgb A1c No results for input(s): HGBA1C in the last 72 hours. Lipid Profile No results for input(s): CHOL, HDL, LDLCALC, TRIG, CHOLHDL, LDLDIRECT in the last 72 hours. Thyroid function studies No results for input(s): TSH, T4TOTAL, T3FREE, THYROIDAB in the last 72 hours.  Invalid input(s): FREET3 Anemia work up No results for input(s): VITAMINB12, FOLATE, FERRITIN, TIBC, IRON, RETICCTPCT in the last 72 hours. Urinalysis    Component Value Date/Time   COLORURINE YELLOW 01/09/2020 1905   APPEARANCEUR CLEAR 01/09/2020 1905   LABSPEC 1.016 01/09/2020 1905   PHURINE 5.0 01/09/2020 1905   GLUCOSEU NEGATIVE 01/09/2020 1905   HGBUR LARGE (A) 01/09/2020 1905    BILIRUBINUR NEGATIVE 01/09/2020 [place] 01/09/2020 1905   PROTEINUR 30 (A) 01/09/2020 1905   NITRITE NEGATIVE 01/09/2020 1905   LEUKOCYTESUR MODERATE (A) 01/09/2020 1905   Sepsis Labs Invalid input(s): PROCALCITONIN,  WBC,  LACTICIDVEN Microbiology Recent Results (from the past 240 hour(s))  Urine culture     Status: None   Collection Time: 01/09/20  7:05 PM   Specimen: Urine, Random  Result Value Ref Range Status   Specimen Description   Final    URINE, RANDOM  Performed at [facility], [place] [address] [place], [place] [address]    Special Requests   Final    NONE Performed at [facility], [place] [address], [place], [place] [address]    Culture   Final    NO GROWTH Performed at [facility], [place] [address], [place], [place] [address]    Report Status 01/11/2020 FINAL  Final  SARS CORONAVIRUS 2 (TAT 6-24 HRS) Nasopharyngeal Nasopharyngeal Swab     Status: None   Collection Time: 01/09/20 11:13 PM   Specimen: Nasopharyngeal Swab  Result Value Ref Range Status   SARS Coronavirus 2 NEGATIVE NEGATIVE Final    Comment: (NOTE) SARS-CoV-2 target nucleic acids are NOT DETECTED. The SARS-CoV-2 RNA is generally detectable in upper and lower respiratory specimens during the acute phase of infection. Negative results do not preclude SARS-CoV-2 infection, do not rule out co-infections with other pathogens, and should not be used as the sole basis for treatment or other patient management decisions. Negative results must be combined with clinical observations, patient history, and epidemiological information. The expected result is Negative. Fact Sheet for Patients: SugarRoll.be Fact Sheet for Healthcare Providers: https://www.woods-mathews.com/ This test is not yet approved or cleared by the [place] FDA and  has been authorized for detection and/or diagnosis of SARS-CoV-2  by FDA under an Emergency Use Authorization (EUA). This EUA will remain  in effect (meaning this test can be used) for the duration of the COVID-19 declaration under Section 56 4(b)(1) of the Act, 21 U.S.C. section 360bbb-3(b)(1), unless the authorization is terminated or revoked sooner. Performed at [facility], [place] [address] [place], [place] [address]      Time coordinating discharge: Over 30 minutes  SIGNED:   Little Ishikawa, DO [facility] 01/13/2020, 10:40 AM Pager   If 7PM-7AM, please contact night-coverage [email or web address]

## 2020-01-18 ENCOUNTER — Emergency Department (HOSPITAL_COMMUNITY): Payer: Medicare Other

## 2020-01-18 ENCOUNTER — Emergency Department (HOSPITAL_COMMUNITY)
Admission: EM | Admit: 2020-01-18 | Discharge: 2020-01-19 | Disposition: A | Discharge status: Home / Self Care | Payer: Medicare Other | Attending: Emergency Medicine | Admitting: Emergency Medicine

## 2020-01-18 DIAGNOSIS — F17210 Nicotine dependence, cigarettes, uncomplicated: Secondary | ICD-10-CM | POA: Diagnosis not present

## 2020-01-18 DIAGNOSIS — Z79899 Other long term (current) drug therapy: Secondary | ICD-10-CM | POA: Diagnosis not present

## 2020-01-18 DIAGNOSIS — Z8551 Personal history of malignant neoplasm of bladder: Secondary | ICD-10-CM | POA: Insufficient documentation

## 2020-01-18 DIAGNOSIS — R0989 Other specified symptoms and signs involving the circulatory and respiratory systems: Secondary | ICD-10-CM | POA: Diagnosis not present

## 2020-01-18 MED ORDER — ONDANSETRON HCL 4 MG/2ML IJ SOLN: 4.0000 mg | Freq: Once | INTRAMUSCULAR | Status: DC

## 2020-01-18 MED ORDER — GLUCAGON HCL RDNA (DIAGNOSTIC) 1 MG IJ SOLR: 1.0000 mg | Freq: Once | INTRAMUSCULAR | Status: DC

## 2020-01-18 NOTE — ED Provider Notes (Signed)
[facility] EMERGENCY DEPARTMENT Provider Note   CSN: [id number] Arrival date & time: 01/18/20  1919     History Chief Complaint  Patient presents with  . Foreign Body    Luis House is a 69 y.o. male.  R presents to ER with sensation of foreign body in throat.  Reports this evening while he was eating chicken he felt like something got stuck in his throat.  Has been unable to eat or drink anything since.  Having a hard time swallowing his saliva and having to spit it up.  No associated abdominal pain, no vomiting.  No difficulty breathing.  Review chart, recent mission for UTI, AKI.  Has history of bladder cancer.  HPI     Past Medical History:  Diagnosis Date  . Arthritis    wrist and knee  . Bladder tumor   . Cataract    right eye  . Collar bone fracture 2017   left    Patient Active Problem List   Diagnosis Date Noted  . UTI (urinary tract infection) 01/10/2020  . ARF (acute renal failure) ([place]) 01/10/2020  . Tobacco abuse 01/10/2020  . Bladder cancer ([place]) 01/10/2020    Past Surgical History:  Procedure Laterality Date  . MULTIPLE TOOTH EXTRACTIONS    . TRANSURETHRAL RESECTION OF BLADDER TUMOR N/A 01/09/2019   Procedure: TRANSURETHRAL RESECTION OF BLADDER TUMOR (TURBT)/ POST OPERATIVE INSTILLATION OF CHEMOTHERAPY;  Surgeon: Kathie Rhodes, MD;  Location: [facility] ORS;  Service: Urology;  Laterality: N/A;       No family history on file.  Social History   Tobacco Use  . Smoking status: Current Every Day Smoker    Packs/day: 0.25    Years: 51.00    Pack years: 12.75    Types: Cigarettes  . Smokeless tobacco: Never Used  Substance Use Topics  . Alcohol use: Yes    Comment: 2 beers per day  . Drug use: Not on file    Home Medications Prior to Admission medications   Medication Sig Start Date End Date Taking? Authorizing Provider  CRANBERRY PO Take 1 tablet by mouth daily.    [provider]  morphine (MSIR) 15 MG tablet  Take 1 tablet (15 mg total) by mouth every 4 (four) hours as needed for severe pain. 01/06/20   Deno Etienne, DO  oxybutynin (DITROPAN XL) 10 MG 24 hr tablet Take 1 tablet (10 mg total) by mouth at bedtime. 01/06/20   Deno Etienne, DO  phenazopyridine (PYRIDIUM) 200 MG tablet Take 1 tablet (200 mg total) by mouth 3 (three) times daily. 01/04/20   Lacretia Leigh, MD  tolterodine (DETROL LA) 2 MG 24 hr capsule Take 2 mg by mouth daily. 12/27/19   [provider]    Allergies    Patient has no known allergies.  Review of Systems   Review of Systems  Constitutional: Negative for chills and fever.  HENT: Negative for ear pain and sore throat.        Foreign body sensation  Eyes: Negative for pain and visual disturbance.  Respiratory: Negative for cough and shortness of breath.   Cardiovascular: Negative for chest pain and palpitations.  Gastrointestinal: Negative for abdominal pain and vomiting.  Genitourinary: Negative for dysuria and hematuria.  Musculoskeletal: Negative for arthralgias and back pain.  Skin: Negative for color change and rash.  Neurological: Negative for seizures and syncope.  All other systems reviewed and are negative.   Physical Exam Updated Vital Signs BP Marland Kitchen)  152/86   Pulse (!) 103   Temp 99.1 F (37.3 C) (Oral)   Resp 16   SpO2 98%   Physical Exam Vitals and nursing note reviewed.  Constitutional:      Appearance: He is well-developed.  HENT:     Head: Normocephalic and atraumatic.     Comments: Spitting up saliva while in room Eyes:     Conjunctiva/sclera: Conjunctivae normal.  Cardiovascular:     Rate and Rhythm: Normal rate and regular rhythm.     Heart sounds: No murmur.  Pulmonary:     Effort: Pulmonary effort is normal. No respiratory distress.     Breath sounds: Normal breath sounds.  Abdominal:     Palpations: Abdomen is soft.     Tenderness: There is no abdominal tenderness.  Musculoskeletal:     Cervical back: Neck supple.  Skin:     General: Skin is warm and dry.  Neurological:     Mental Status: He is alert.     ED Results / Procedures / Treatments   Labs (all labs ordered are listed, but only abnormal results are displayed) Labs Reviewed - No data to display  EKG None  Radiology DG Neck Soft Tissue  Result Date: 01/18/2020 CLINICAL DATA:  Swallowed chicken bone EXAM: NECK SOFT TISSUES - 1+ VIEW COMPARISON:  None. FINDINGS: There is no evidence of retropharyngeal soft tissue swelling or epiglottic enlargement. The cervical airway is unremarkable and no radio-opaque foreign body identified. Normal appearance of the hyoid bone and thyroid cartilages. IMPRESSION: No visible radiopaque foreign body retropharyngeal soft tissue thickening. Electronically Signed   By: Lovena Le M.D.   On: 01/18/2020 19:59   DG Chest 2 View  Result Date: 01/18/2020 CLINICAL DATA:  Globus sensation, concern for chicken bone EXAM: CHEST - 2 VIEW COMPARISON:  None. FINDINGS: Right IJ approach Port-A-Cath tip terminates at the superior cavoatrial junction. Catheter tubing is intact. No radiopaque foreign body projects over the tracheal air column or over either lung field. No abnormal hyperinflation of either lung to suggest an air trapping mechanism. No consolidation, features of edema, pneumothorax, or effusion. The cardiomediastinal contours are unremarkable. 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 Multilevel flowing anterior osteophytosis, compatible with features of diffuse idiopathic skeletal hyperostosis (DISH). IMPRESSION: 1. No radiopaque foreign body projects over the tracheal air column or over either lung field. 2. No acute cardiopulmonary abnormality. 3. 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 Electronically Signed   By: Lovena Le M.D.   On: 01/18/2020 22:57    Procedures Procedures (including critical care time)  Medications Ordered in ED Medications - No data to display  ED Course  I have  reviewed the triage vital signs and the nursing notes.  Pertinent labs & imaging results that were available during my care of the patient were reviewed by me and considered in my medical decision making (see chart for details).    MDM Rules/Calculators/A&P                      69 year old male presenting to ER with foreign body sensation in esophagus versus throat.  Initially based on history, patient's spitting up in room, suspected that he had esophageal food bolus, food impaction.  Patient was provided trial of p.o., upon drinking water he had sudden resolution of his symptoms.  Proceeded to drink larger quantity of water, solids and had no difficulty.  He had no ongoing symptoms.  Suspect he may have had a  partial food bolus but this has clinically resolved.  He has no ongoing symptoms, do not feel he needs any additional monitoring or work-up.  Reviewed return precautions, recommended follow-up with primary doctor.  Discharged home.    After the discussed management above, the patient was determined to be safe for discharge.  The patient was in agreement with this plan and all questions regarding their care were answered.  ED return precautions were discussed and the patient will return to the ED with any significant worsening of condition.    Final Clinical Impression(s) / ED Diagnoses Final diagnoses:  Foreign body sensation in throat    Rx / DC Orders ED Discharge Orders    None       Lucrezia Starch, MD 01/19/20 2348

## 2020-01-18 NOTE — Discharge Instructions (Addendum)
If you develop any difficulty swallowing, difficulty eating, recurrent sensation of foreign body in your throat or esophagus, please return to ER for reassessment.  Otherwise recommend follow-up with your primary care doctor.  Recommend eating soft foods for the next couple days.

## 2020-01-18 NOTE — ED Triage Notes (Signed)
BIB EMS from home. Pt reports he was eating chicken when he swallowed chicken bone. States he feels like it is stuck in his throat. No distress.

## 2021-02-20 IMAGING — CT CT RENAL STONE PROTOCOL
2 of 4 series · 17 of 46 positions shown, 19 images · non-contrast
Comparison: CT abdomen dated 10/07/2016.

CLINICAL DATA: Blood in urine for 2 days. Bilateral lower abdominal
pain.

EXAM:
CT ABDOMEN AND PELVIS WITHOUT CONTRAST
TECHNIQUE: Multidetector CT imaging of the abdomen and pelvis was performed
following the standard protocol without IV contrast.

[Series 2: axial st · axial · 0.69mm/px · z∈[-136,+200]mm · 14 of 77 slices shown, 16 images]
[im 5/77  soft-tissue]
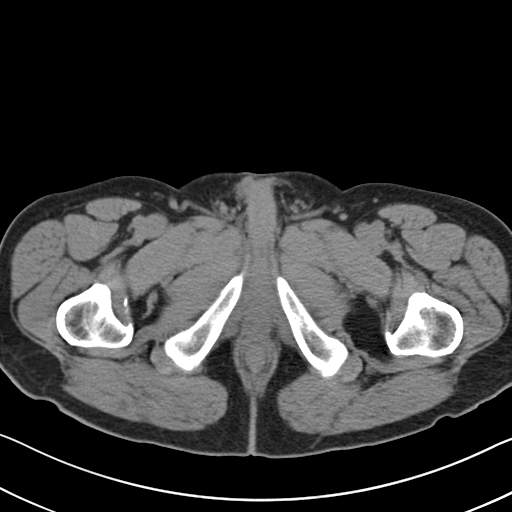
[im 5/77  bone]
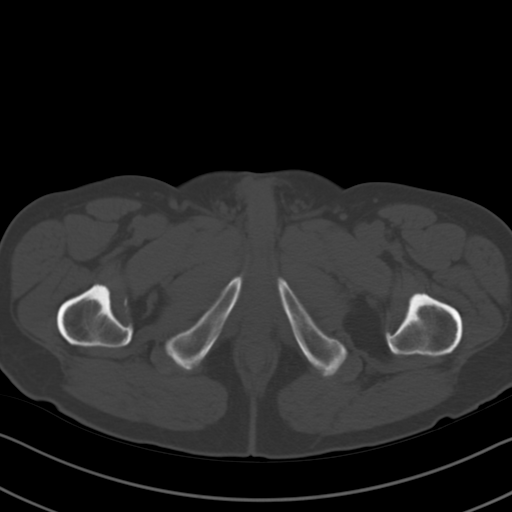
[im 9/77  soft-tissue]
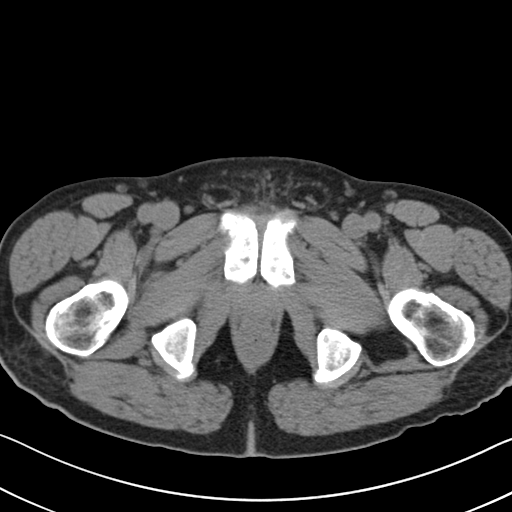
[im 17/77  soft-tissue]
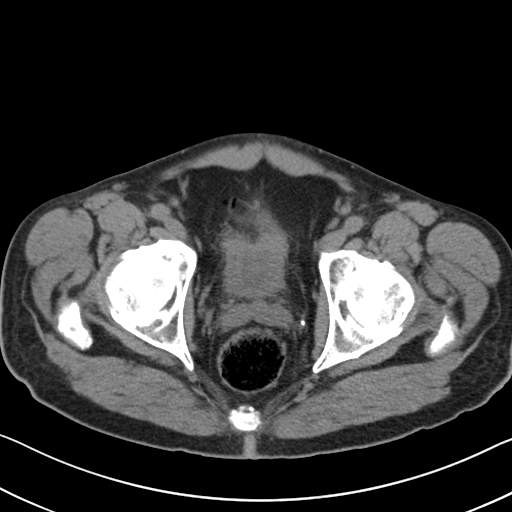
[im 22/77  soft-tissue]
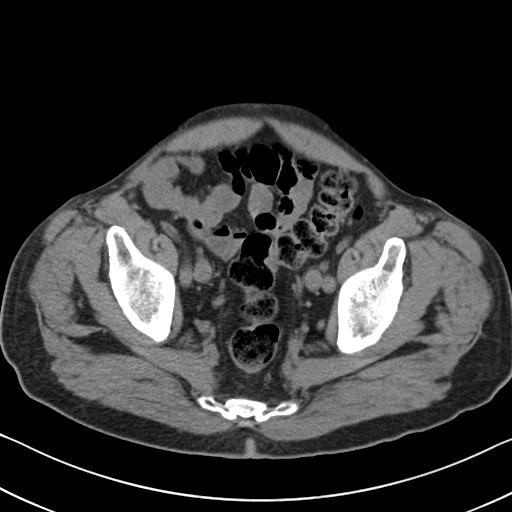
[im 26/77  soft-tissue]
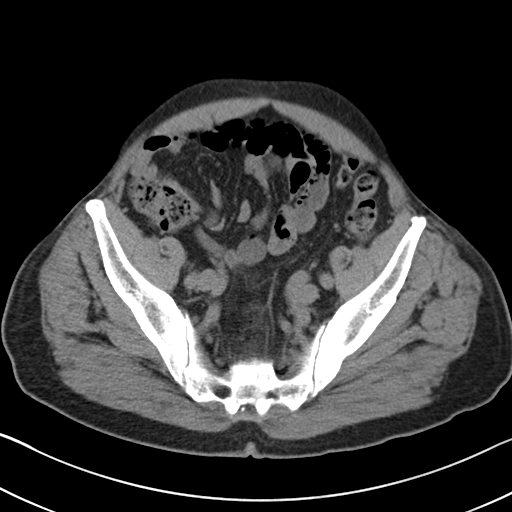
[im 30/77  soft-tissue]
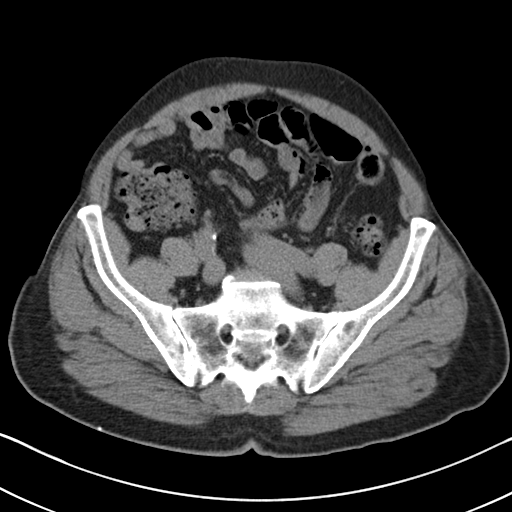
[im 34/77  soft-tissue]
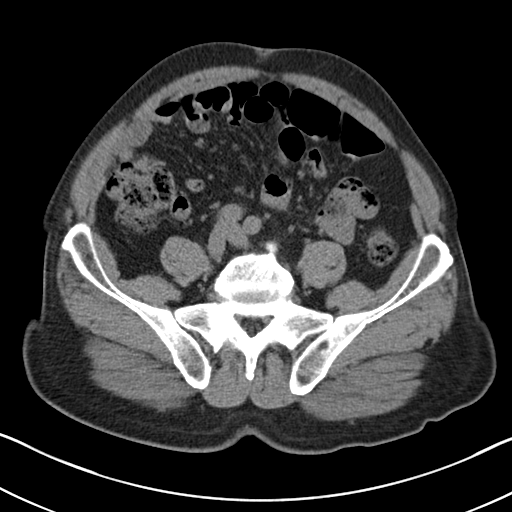
[im 43/77  soft-tissue]
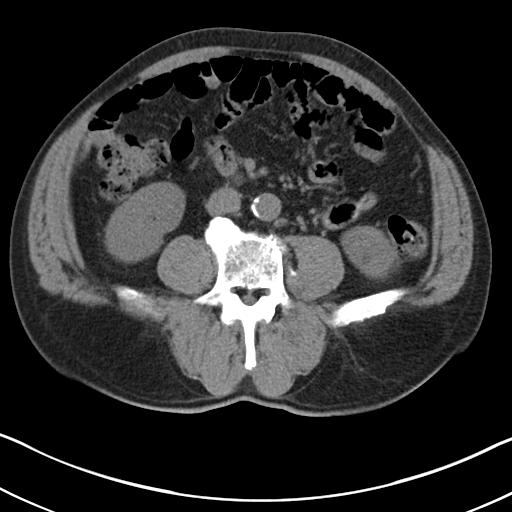
[im 47/77  soft-tissue]
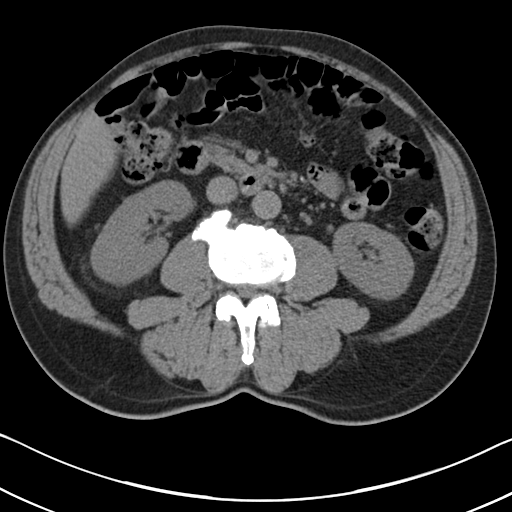
[im 47/77  bone]
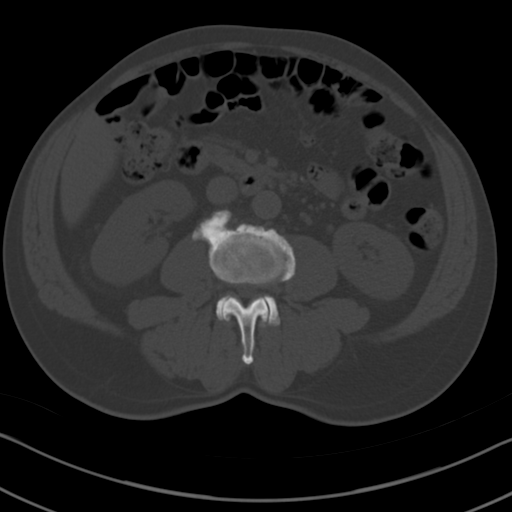
[im 51/77  soft-tissue]
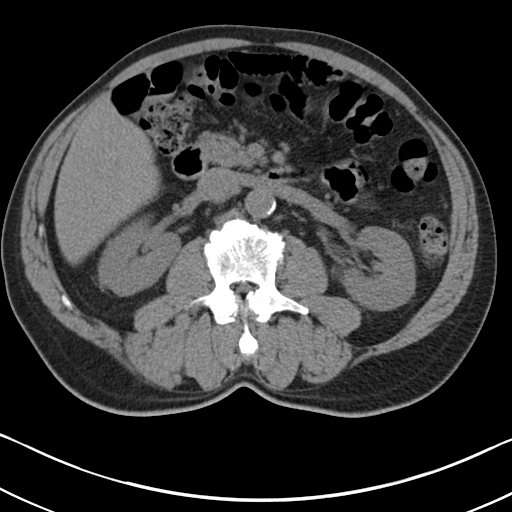
[im 55/77  soft-tissue]
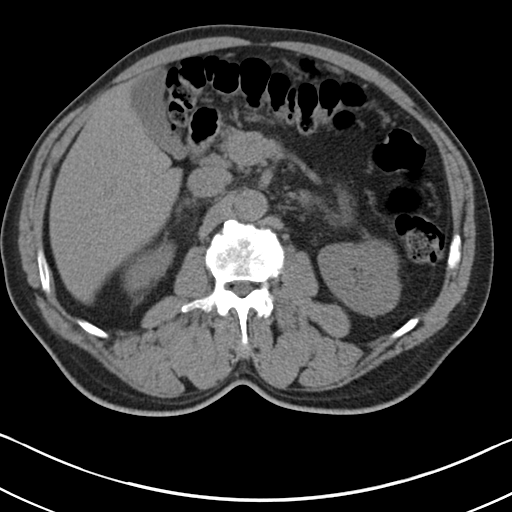
[im 60/77  soft-tissue]
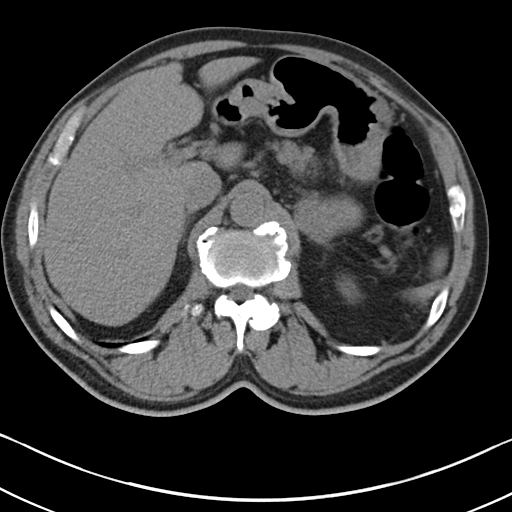
[im 68/77  soft-tissue]
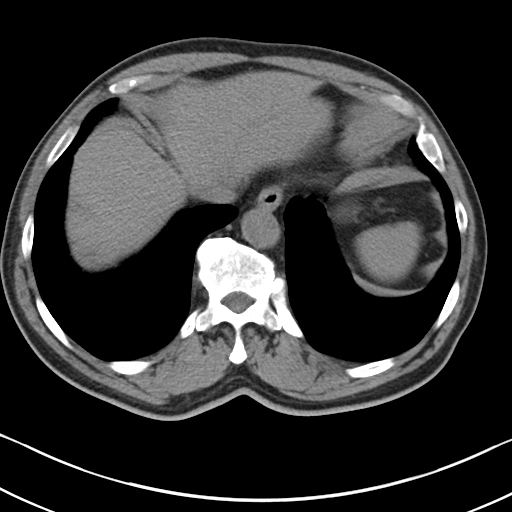
[im 72/77  soft-tissue]
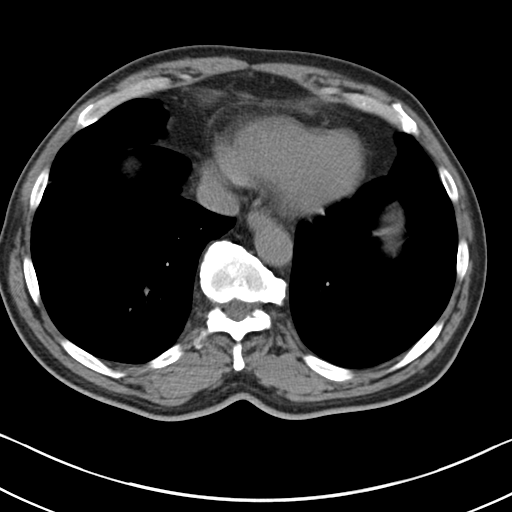

[Series 5: coronal · coronal · 0.73mm/px · 3 of 142 slices shown]
[im 48/142  soft-tissue]
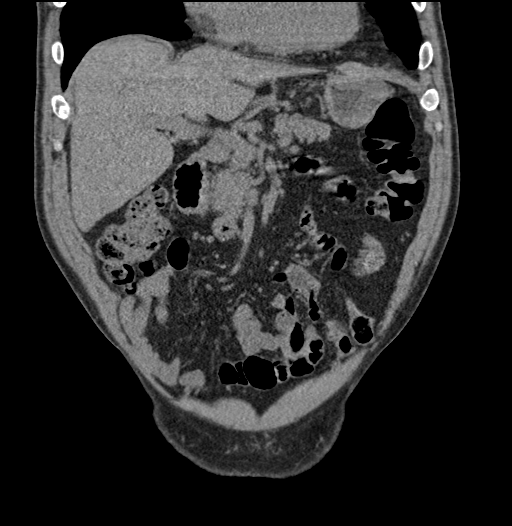
[im 63/142  soft-tissue]
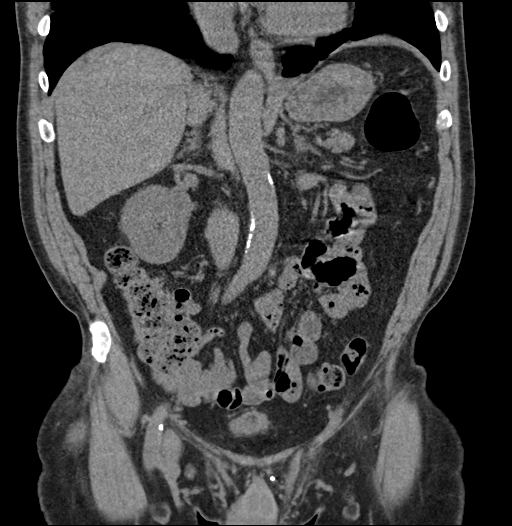
[im 79/142  soft-tissue]
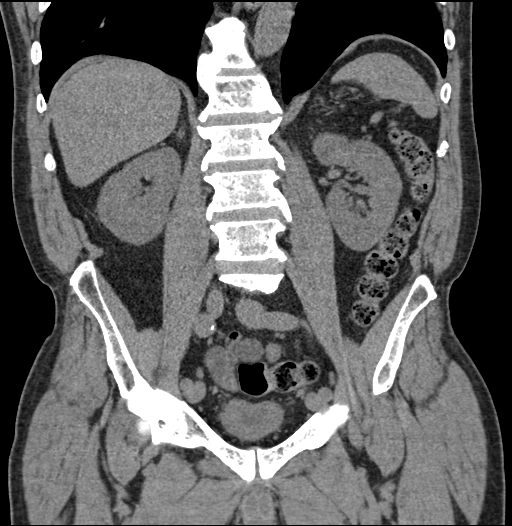

[17 of 46 positions shown; findings below may reference images not displayed]

FINDINGS: Lower chest: No acute findings.

Hepatobiliary: No focal liver abnormality is seen. No gallstones,
gallbladder wall thickening, or biliary dilatation.

Pancreas: Unremarkable. No pancreatic ductal dilatation or
surrounding inflammatory changes.

Spleen: Normal in size without focal abnormality.

Adrenals/Urinary Tract: Adrenal glands appear normal. Kidneys are
unremarkable without mass, stone or hydronephrosis. No ureteral or
bladder calculi identified. Bladder is decompressed limiting
characterization of its walls.

Stomach/Bowel: No dilated large or small bowel loops. No evidence of
bowel wall inflammation. Appendix is normal. Stomach is
unremarkable, partially decompressed.

Vascular/Lymphatic: Aortic atherosclerosis. No enlarged lymph nodes
seen in the abdomen or pelvis.

Reproductive: Prostate is unremarkable.

Other: No free fluid or abscess collection. No free intraperitoneal
air.

Musculoskeletal: No acute or suspicious osseous finding.
Degenerative change at the bilateral hip joints, RIGHT greater than
LEFT, at least moderate in degree. Additional degenerative changes
throughout the thoracolumbar spine, moderate in degree.
IMPRESSION: 1. No acute findings within the abdomen or pelvis. No renal or
ureteral calculi. No bowel obstruction or evidence of bowel wall
inflammation. Appendix is normal.
2. Bladder is decompressed limiting characterization.
3. Degenerative changes of the bilateral hip joints and spine, as
detailed above.

Aortic Atherosclerosis ([id number]).

## 2021-04-18 ENCOUNTER — Telehealth: Payer: Self-pay | Admitting: Hematology

## 2021-04-18 ENCOUNTER — Emergency Department (HOSPITAL_COMMUNITY): Payer: Medicare Other

## 2021-04-18 ENCOUNTER — Other Ambulatory Visit: Payer: Self-pay

## 2021-04-18 ENCOUNTER — Encounter (HOSPITAL_COMMUNITY): Payer: Self-pay

## 2021-04-18 ENCOUNTER — Emergency Department (HOSPITAL_COMMUNITY)
Admission: EM | Admit: 2021-04-18 | Discharge: 2021-04-18 | Disposition: A | Discharge status: Home / Self Care | Payer: Medicare Other | Attending: Emergency Medicine | Admitting: Emergency Medicine

## 2021-04-18 ENCOUNTER — Other Ambulatory Visit: Payer: Self-pay | Admitting: Oncology

## 2021-04-18 DIAGNOSIS — F17210 Nicotine dependence, cigarettes, uncomplicated: Secondary | ICD-10-CM | POA: Diagnosis not present

## 2021-04-18 DIAGNOSIS — Z8551 Personal history of malignant neoplasm of bladder: Secondary | ICD-10-CM | POA: Insufficient documentation

## 2021-04-18 DIAGNOSIS — R1032 Left lower quadrant pain: Secondary | ICD-10-CM | POA: Diagnosis not present

## 2021-04-18 DIAGNOSIS — C679 Malignant neoplasm of bladder, unspecified: Secondary | ICD-10-CM

## 2021-04-18 DIAGNOSIS — R319 Hematuria, unspecified: Secondary | ICD-10-CM | POA: Diagnosis present

## 2021-04-18 DIAGNOSIS — R1031 Right lower quadrant pain: Secondary | ICD-10-CM | POA: Diagnosis not present

## 2021-04-18 LAB — URINALYSIS, ROUTINE W REFLEX MICROSCOPIC
Bacteria, UA: NONE SEEN
Bilirubin Urine: NEGATIVE
Glucose, UA: NEGATIVE mg/dL
Ketones, ur: NEGATIVE mg/dL
Nitrite: NEGATIVE
Protein, ur: NEGATIVE mg/dL
RBC / HPF: >50 RBC/hpf — ABNORMAL HIGH (ref 0–5)
Specific Gravity, Urine: 1.008 (ref 1.005–1.030)
pH: 6 (ref 5.0–8.0)

## 2021-04-18 LAB — CBC WITH DIFFERENTIAL/PLATELET
Abs Immature Granulocytes: 0.05 10*3/uL (ref 0.00–0.07)
Basophils Absolute: 0 10*3/uL (ref 0.0–0.1)
Basophils Relative: 1 %
Eosinophils Absolute: 0.1 10*3/uL (ref 0.0–0.5)
Eosinophils Relative: 1 %
HCT: 39.2 % (ref 39.0–52.0)
Hemoglobin: 12.4 g/dL — ABNORMAL LOW (ref 13.0–17.0)
Immature Granulocytes: 1 %
Lymphocytes Relative: 11 %
Lymphs Abs: 1 10*3/uL (ref 0.7–4.0)
MCH: 27.3 pg (ref 26.0–34.0)
MCHC: 31.6 g/dL (ref 30.0–36.0)
MCV: 86.3 fL (ref 80.0–100.0)
Monocytes Absolute: 0.9 10*3/uL (ref 0.1–1.0)
Monocytes Relative: 10 %
Neutro Abs: 6.6 10*3/uL (ref 1.7–7.7)
Neutrophils Relative %: 76 %
Platelets: 316 10*3/uL (ref 150–400)
RBC: 4.54 MIL/uL (ref 4.22–5.81)
RDW: 14.6 % (ref 11.5–15.5)
WBC: 8.7 10*3/uL (ref 4.0–10.5)
nRBC: 0 % (ref 0.0–0.2)

## 2021-04-18 LAB — COMPREHENSIVE METABOLIC PANEL
ALT: <5 U/L (ref 0–44)
AST: 24 U/L (ref 15–41)
Albumin: 2.5 g/dL — ABNORMAL LOW (ref 3.5–5.0)
Alkaline Phosphatase: 37 U/L — ABNORMAL LOW (ref 38–126)
Anion gap: 3 — ABNORMAL LOW (ref 5–15)
BUN: 12 mg/dL (ref 8–23)
CO2: 20 mmol/L — ABNORMAL LOW (ref 22–32)
Calcium: 6.1 mg/dL — CL (ref 8.9–10.3)
Chloride: 119 mmol/L — ABNORMAL HIGH (ref 98–111)
Creatinine, Ser: 0.88 mg/dL (ref 0.61–1.24)
GFR, Estimated: >60 mL/min (ref >60)
Glucose, Bld: 75 mg/dL (ref 70–99)
Potassium: 4.6 mmol/L (ref 3.5–5.1)
Sodium: 142 mmol/L (ref 135–145)
Total Bilirubin: 1.6 mg/dL — ABNORMAL HIGH (ref 0.3–1.2)
Total Protein: 5.4 g/dL — ABNORMAL LOW (ref 6.5–8.1)

## 2021-04-18 MED ORDER — CALCIUM GLUCONATE-NACL 1-0.675 GM/50ML-% IV SOLN
1.0000 g | Freq: Once | INTRAVENOUS | Status: AC
  Administered 2021-04-18: 1000 mg via INTRAVENOUS
  Filled 2021-04-18: qty 50

## 2021-04-18 MED ORDER — IOHEXOL 350 MG/ML SOLN
100.0000 mL | Freq: Once | INTRAVENOUS | Status: AC | PRN
  Administered 2021-04-18: 80 mL via INTRAVENOUS

## 2021-04-18 MED ORDER — CALCIUM CARBONATE ANTACID 500 MG PO CHEW
1.0000 | CHEWABLE_TABLET | Freq: Once | ORAL | Status: AC
  Administered 2021-04-18: 200 mg via ORAL
  Filled 2021-04-18: qty 1

## 2021-04-18 NOTE — Discharge Instructions (Addendum)
Please call urology today and set up an appointment with Dr. Gloriann Loan.  He will see you as soon as possible, this is important that you schedule soon.  Please also call and establish care with a primary care provider.  I provided you a number which you can call.  If things change or worsen please return back to the ED for further evaluation.  You have an appointment with oncology.  It will be 7/20 at 1 pm. [place], [place], [place] [address]

## 2021-04-18 NOTE — Telephone Encounter (Signed)
A new pt appt has been scheduled to see Dr. Irene Limbo for bladder cancer on 7/20 at 1pm.

## 2021-04-18 NOTE — ED Triage Notes (Addendum)
Patient c/o hematuria x 2 days. Patient reports that he has bladder cancer. Patient reports that he "problems getting the urine out."  Patient recently moved from [place]

## 2021-04-18 NOTE — ED Notes (Signed)
Patient urinates frequently- pink tinged urine and small to moderate amounts

## 2021-04-18 NOTE — ED Provider Notes (Signed)
[facility] DEPT Provider Note   CSN: [id number] Arrival date & time: 04/18/21  0950     History Chief Complaint  Patient presents with   Hematuria    Luis House is a 70 y.o. male.  HPI  Patient presents with multiple days of frank hematuria.  He has a history of bladder cancer, was being followed in [place] but recently moved to [place].  States his oncologist told him to seek emergency treatment if he starts having frank blood in the urine.  He also reports feeling weaker than normal.  He is also having associated lower quadrant abdominal pain bilaterally.  No nausea or vomiting.  States he is taking his medications as prescribed, but has not set up follow-up with oncology here in [place].  Past Medical History:  Diagnosis Date   Arthritis    wrist and knee   Bladder tumor    Cataract    right eye   Collar bone fracture 2017   left    Patient Active Problem List   Diagnosis Date Noted   UTI (urinary tract infection) 01/10/2020   ARF (acute renal failure) ([place]) 01/10/2020   Tobacco abuse 01/10/2020   Bladder cancer ([place]) 01/10/2020    Past Surgical History:  Procedure Laterality Date   MULTIPLE TOOTH EXTRACTIONS     TRANSURETHRAL RESECTION OF BLADDER TUMOR N/A 01/09/2019   Procedure: TRANSURETHRAL RESECTION OF BLADDER TUMOR (TURBT)/ POST OPERATIVE INSTILLATION OF CHEMOTHERAPY;  Surgeon: Kathie Rhodes, MD;  Location: [facility];  Service: Urology;  Laterality: N/A;       History reviewed. No pertinent family history.  Social History   Tobacco Use   Smoking status: Every Day    Packs/day: 0.25    Years: 51.00    Pack years: 12.75    Types: Cigarettes   Smokeless tobacco: Never  Vaping Use   Vaping Use: Never used  Substance Use Topics   Alcohol use: Not Currently    Comment: 2 beers per day   Drug use: Never    Home Medications Prior to Admission medications   Medication Sig Start Date End Date  Taking? Authorizing Provider  CRANBERRY PO Take 1 tablet by mouth daily.    [provider]  morphine (MSIR) 15 MG tablet Take 1 tablet (15 mg total) by mouth every 4 (four) hours as needed for severe pain. 01/06/20   Deno Etienne, DO  oxybutynin (DITROPAN XL) 10 MG 24 hr tablet Take 1 tablet (10 mg total) by mouth at bedtime. 01/06/20   Deno Etienne, DO  phenazopyridine (PYRIDIUM) 200 MG tablet Take 1 tablet (200 mg total) by mouth 3 (three) times daily. 01/04/20   Lacretia Leigh, MD  tolterodine (DETROL LA) 2 MG 24 hr capsule Take 2 mg by mouth daily. 12/27/19   [provider]    Allergies    Patient has no known allergies.  Review of Systems   Review of Systems  Constitutional:  Negative for fever.  Gastrointestinal:  Positive for abdominal pain. Negative for diarrhea, nausea and vomiting.  Genitourinary:  Positive for hematuria. Negative for dysuria, penile swelling and urgency.   Physical Exam Updated Vital Signs BP (!) 183/105 (BP Location: Right Arm)   Pulse 75   Temp 98 F (36.7 C) (Oral)   Resp 18   Ht 5\' 7"  (1.702 m)   Wt 72.1 kg   SpO2 100%   BMI 24.90 kg/m   Physical Exam Vitals and nursing note reviewed.  Exam conducted with a chaperone present.  Constitutional:      Appearance: Normal appearance. He is ill-appearing.     Comments: Chronically ill-appearing  HENT:     Head: Normocephalic and atraumatic.  Eyes:     General: No scleral icterus.       Right eye: No discharge.        Left eye: No discharge.     Extraocular Movements: Extraocular movements intact.     Pupils: Pupils are equal, round, and reactive to light.  Cardiovascular:     Rate and Rhythm: Normal rate and regular rhythm.     Pulses: Normal pulses.     Heart sounds: Normal heart sounds. No murmur heard.   No friction rub. No gallop.  Pulmonary:     Effort: Pulmonary effort is normal. No respiratory distress.     Breath sounds: Normal breath sounds.  Abdominal:     General:  Abdomen is flat. Bowel sounds are normal. There is no distension.     Palpations: Abdomen is soft.     Tenderness: There is abdominal tenderness.     Comments: Tender to palpation to the left lower quadrant and right lower quadrant.  Skin:    General: Skin is warm and dry.     Coloration: Skin is not jaundiced.  Neurological:     Mental Status: He is alert. Mental status is at baseline.     Coordination: Coordination normal.    ED Results / Procedures / Treatments   Labs (all labs ordered are listed, but only abnormal results are displayed) Labs Reviewed  URINALYSIS, ROUTINE W REFLEX MICROSCOPIC - Abnormal; Notable for the following components:      Result Value   Hgb urine dipstick LARGE (*)    Leukocytes,Ua SMALL (*)    RBC / HPF >50 (*)    All other components within normal limits    EKG None  Radiology No results found.  Procedures Procedures   Medications Ordered in ED Medications - No data to display  ED Course  I have reviewed the triage vital signs and the nursing notes.  Pertinent labs & imaging results that were available during my care of the patient were reviewed by me and considered in my medical decision making (see chart for details).  Clinical Course as of 04/18/21 1532  Tue Apr 18, 2021  1216 CBC with Differential(!) No anemia, no elevated WBC concerning for infection  [HS]  1217 Urinalysis, Routine w reflex microscopic Urine, Clean Catch(!) Small leukocytosis without nitrites. No UTI symptoms. Doubt UTI.  [HS]  1220 Calcium(!!): 6.1 Corrected CA 7.3 [HS]  518 70 year old male with history of bladder cancer being treated in [place] has just recently moved here.  Complaining of some painless hematuria.  Labs showing some hematuria and low calcium.  Will review with oncology and see if we can get him close follow-up. [MB]    Clinical Course User Index [HS] Sherrill Raring, PA-C [MB] Hayden Rasmussen, MD   MDM Rules/Calculators/A&P                           Stable vitals, no fever.  Patient with history of bladder cancer presents with 3 days of hematuria, painless.  He has some tenderness on exam, will order labs and consider ordering imaging.   Please see ED course for interpretation of labs and work-up.  Spoke with Dr. Irene Limbo with oncology who is happy to set up  the patient for an outpatient follow-up visit.  Spoke with Dr. Gloriann Loan with urology who is going to see the patient outpatient.  He thinks patient should be seen sooner rather than later for biopsy.  Does not recommend any additional testing or treatment while in the ED today.  Patient is resting comfortably.  He is not in any acute pain at the moment.  We have corrected his calcium while in the ED.  Painless hematuria will require more work-up and he will need follow-up for his cancer with oncology.  I do not see signs of emergent disease or reason for admission today based on the work-up.  Although he is hypertensive, he has not been taking his medicine for his blood pressure regularly.  Additionally, he is not having signs of endorgan damage or renal failure suggestive of hypertensive urgency.  He is not having any chest pain, vision changes.  Final Clinical Impression(s) / ED Diagnoses Final diagnoses:  None    Rx / DC Orders ED Discharge Orders     None        Sherrill Raring, Hershal Coria 04/18/21 [place]    Hayden Rasmussen, MD 04/18/21 1731

## 2021-04-18 NOTE — ED Notes (Signed)
Dr Melina Copa was sent a secure message of critical calcium level of 6.1

## 2021-04-18 NOTE — Progress Notes (Signed)
Brief oncology note:  Received request for consultation.  However, received update that patient will not be admitted to the hospital.  I have entered an ambulatory referral to oncology to schedule a new patient appointment in about 1 week with Dr. Irene House.   I sent a chat message to my new patient coordinator to alert her of the need to schedule a new patient appointment as soon as possible.  Luis House is scheduled to see Dr. Irene House on 7/20 at 1 PM.  I updated the PA in the ER with the appointment date/time and the appointment will print on his AVS.  [name], DNP, AGPCNP-BC, AOCNP

## 2021-04-19 LAB — URINE CULTURE: Culture: NO GROWTH

## 2021-04-26 ENCOUNTER — Ambulatory Visit: Payer: Medicare Other | Admitting: Hematology

## 2021-05-01 ENCOUNTER — Inpatient Hospital Stay (INDEPENDENT_AMBULATORY_CARE_PROVIDER_SITE_OTHER): Payer: Medicare Other | Admitting: Primary Care

## 2021-05-03 ENCOUNTER — Encounter: Payer: Self-pay | Admitting: Physician Assistant

## 2021-05-03 ENCOUNTER — Ambulatory Visit (INDEPENDENT_AMBULATORY_CARE_PROVIDER_SITE_OTHER): Payer: Medicare Other | Admitting: Physician Assistant

## 2021-05-03 ENCOUNTER — Other Ambulatory Visit: Payer: Self-pay

## 2021-05-03 VITALS — BP 121/79 | HR 73 | Temp 98.2°F | Resp 18 | Ht 66.0 in | Wt 147.0 lb

## 2021-05-03 DIAGNOSIS — C679 Malignant neoplasm of bladder, unspecified: Secondary | ICD-10-CM

## 2021-05-03 MED ORDER — PREGABALIN 100 MG PO CAPS: 100.0000 mg | ORAL_CAPSULE | Freq: Two times a day (BID) | ORAL | 1 refills | Status: DC

## 2021-05-03 MED ORDER — SOLIFENACIN SUCCINATE 10 MG PO TABS: 10.0000 mg | ORAL_TABLET | Freq: Every morning | ORAL | 1 refills | Status: DC

## 2021-05-03 MED ORDER — PHENAZOPYRIDINE HCL 200 MG PO TABS: 200.0000 mg | ORAL_TABLET | Freq: Three times a day (TID) | ORAL | 0 refills | Status: DC | PRN

## 2021-05-03 NOTE — Progress Notes (Signed)
New Patient Office Visit  Subjective:  Patient ID: Luis House, male    DOB: 1950/12/15  Age: 70 y.o. MRN: [id number]  CC:  Chief Complaint  Patient presents with   Hospitalization Follow-up    HPI Luis House reports that he was seen in the emergency department on April 18, 2021.  Hospital course.   HPI   Patient presents with multiple days of frank hematuria.  He has a history of bladder cancer, was being followed in [place] but recently moved to [place].  States his oncologist told him to seek emergency treatment if he starts having frank blood in the urine.  He also reports feeling weaker than normal.  He is also having associated lower quadrant abdominal pain bilaterally.  No nausea or vomiting.  States he is taking his medications as prescribed, but has not set up follow-up with oncology here in [place].     ED Course  I have reviewed the triage vital signs and the nursing notes.   Pertinent labs & imaging results that were available during my care of the patient were reviewed by me and considered in my medical decision making (see chart for details).      Clinical Course as of 04/18/21 1532  Tue Apr 18, 2021  1216 CBC with Differential(!) No anemia, no elevated WBC concerning for infection  [HS]  1217 Urinalysis, Routine w reflex microscopic Urine, Clean Catch(!) Small leukocytosis without nitrites. No UTI symptoms. Doubt UTI.  [HS]  1220 Calcium(!!): 6.1 Corrected CA 7.3 [HS]  [id number] 70 year old male with history of bladder cancer being treated in [place] has just recently moved here.  Complaining of some painless hematuria.  Labs showing some hematuria and low calcium.  Will review with oncology and see if we can get him close follow-up. [MB]      States today that he did not realize that he was supposed to follow-up with Dr. Irene Limbo on April 26, 2021.  Daughter is present and does help with history, she is currently acting as his  caregiver.  Reports that he has been out of his medications and has been having pain, worse at night.    Request paperwork completed for home health services.   On review of patient's chart from [place]:  Pathology: Initial pT1 disease in 2019 from [facility] (no path report available TURBT 06/18/19: A) URINARY BLADDER TUMOR (ANTERIOR WALL), TUR - PAPILLARY UROTHELIAL CARCINOMA, HIGH-GRADE - TUMOR EXTENSIVELY INVADES LAMINA PROPRIA - MUSCULARIS PROPRIA IS PRESENT WITH NO EVIDENCE OF DEFINITE INVASION   B) URINARY BLADDER TUMOR (POSTERIOR WALL), TUR - PAPILLARY UROTHELIAL CARCINOMA, HIGH-GRADE - TUMOR INVADES MUSCULARIS PROPRIA   C) URINARY BLADDER TUMOR (NECK), TUR - PAPILLARY UROTHELIAL CARCINOMA, HIGH-GRADE - TUMOR INVADES LAMINA PROPRIA - MUSCULARIS PROPRIA IS PRESENT WITH NO EVIDENCE OF DEFINITE INVASION    Cancer Stage: Initial pT1, recurrent pT2Nx Cancer Staging No matching staging information was found for the patient.   Sites of Disease: Bladder   Previous Treatment: TURBT 2019 in  -TURBT with intravesicular Gemcitabine 07/10/19  -Neoadjuvant Cisplatin + Gemcitabine C1 07/29/19 (stopped after Cycle 1 due to renal injury) -Concurrent chemoradiation with Gemcitabine '70mg'$ /m2 weekly completed 8 weekly cycles from 09/08/19-10/27/19.  -6 doses of BCG with interferon intravesicular completed 12/01/2020  Current Treatment: Pembrolizumab '200mg'$  Q3 weeks: C1 03/09/21  HPI  Luis House comes for follow-up of bladder cancer today. He missed 2 scheduled infusions and has not started on Keytruda since last visit. Has longstanding  history in our clinic of missing appointments. He has chronic issue with dysuria but no new complaints otherwise. He blames transportation issues on missing two previous infusion appointments.     Imaging: CT C/A/P w/out contrast 03/03/21: IMPRESSION:  1. Continued bilateral hydronephrosis and ureterectasis consistent with obstruction from patient's  bladder carcinoma. The wall thickening of the anterior aspect of the bladder appears to have decreased since previous study. No abdominal or pelvic lymphadenopathy. Stable small benign-appearing nodules in both lungs.  Impression /Plan:  1. Urothelial bladder cancer, Stage II pT2N0M0  -Initial diagnosis of stage I bladder cancer in 2019 in [place] treated with TURBT, now with recurrent disease in 3 bladder tumors removed by TURBT 1 with muscle invasion on pathology. He was started on neoadjuvant Cisplatin + Gemcitabine and completed Cycle 1 but had significant renal injury that did not fully recover. His level of renal dysfunction will make him not a candidate for further Cisplatin based chemotherapy. He switched to trimodal approach and completed concurrent chemoradiation with 8 cycles of weekly Gemcitabine in 10/2019. Decision against cystectomy with Urology given he is a poor surgical candidate. -He underwent cystoscopy in 08/2020 with no residual tumor noted on exam but bladder washings positive for high grade urothelial carcinoma cells likely representing a carcinoma in situ. His case was discussed at multidisciplinary tumor board with recommendation to consider BCG treatments given he has never had localized bladder cancer treatments. He completed 6 cycle of BCG plus interferon intravesicular late on 12/01/2020. Surveillance scans to [facility] showed no evidence of distant disease but unfortunately most recent cystoscopy from 01/2021 still with residual high-grade urothelial carcinoma. -His case was discussed again at multidisciplinary tumor board with recommendation for immunotherapy. -Recommended starting on Keytruda 200 mg daily but patient unfortunately no showed to last 2 infusion appointments and has not started on treatment yet. He has a long history of missing appointments previously. Is able to get infusion chair for him today to go ahead and start therapy. I have ordered immunotherapy  treatment plan and will continue to monitor for toxicities of therapy. Discussed importance of coming to appointments. Social work has been involved with arranging transport. -We will continue to repeat CT scans every 3 months reviewed most recent scans with patient today.  2. CKD Stage III -Renal function stable today. 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 Dysuria -Only present during daytime has been chronic the entire time I've followed him. I have tried prescription for Pyridium which he reports taking and provided some relief. He has no other symptoms consistent with infectious process and we have run many urine cultures on him which have all been negative for infections and prophylactic treatments for UTI in past have not helped. I discussed with patient would refer to urology for further recommendations.   4. Normocytic anemia -Likely secondary to previous treatment, stable will monitor for now.    [facility]    Past Medical History:  Diagnosis Date   Arthritis    wrist and knee   Bladder tumor    Cataract    right eye   Collar bone fracture 2017   left    Past Surgical History:  Procedure Laterality Date   MULTIPLE TOOTH EXTRACTIONS     TRANSURETHRAL RESECTION OF BLADDER TUMOR N/A 01/09/2019   Procedure: TRANSURETHRAL RESECTION OF BLADDER TUMOR (TURBT)/ POST OPERATIVE INSTILLATION OF CHEMOTHERAPY;  Surgeon: Kathie Rhodes, MD;  Location: [facility];  Service: Urology;  Laterality:  N/A;    History reviewed. No pertinent family history.  Social History   Socioeconomic History   Marital status: Divorced    Spouse name: Not on file   Number of children: Not on file   Years of education: Not on file   Highest education level: Not on file  Occupational History   Not on file  Tobacco Use   Smoking status: Every Day    Packs/day: 0.25    Years: 51.00    Pack years: 12.75    Types: Cigarettes    Smokeless tobacco: Never  Vaping Use   Vaping Use: Never used  Substance and Sexual Activity   Alcohol use: Not Currently    Comment: 2 beers per day   Drug use: Never   Sexual activity: Not on file  Other Topics Concern   Not on file  Social History Narrative   Not on file   Social Determinants of Health   Financial Resource Strain: Not on file  Food Insecurity: Not on file  Transportation Needs: Not on file  Physical Activity: Not on file  Stress: Not on file  Social Connections: Not on file  Intimate Partner Violence: Not on file    ROS Review of Systems  Constitutional:  Negative for chills and fever.  HENT: Negative.    Eyes: Negative.   Respiratory:  Negative for shortness of breath.   Cardiovascular:  Negative for chest pain.  Gastrointestinal: Negative.   Endocrine: Negative.   Genitourinary:  Positive for dysuria.  Musculoskeletal: Negative.   Skin: Negative.   Allergic/Immunologic: Negative.   Neurological: Negative.   Hematological: Negative.   Psychiatric/Behavioral: Negative.     Objective:   Today's Vitals: There were no vitals taken for this visit.  Physical Exam Vitals and nursing note reviewed.  Constitutional:      Appearance: Normal appearance.  HENT:     Head: Normocephalic and atraumatic.     Right Ear: External ear normal.     Left Ear: External ear normal.     Nose: Nose normal.     Mouth/Throat:     Mouth: Mucous membranes are moist.     Pharynx: Oropharynx is clear.  Eyes:     Extraocular Movements: Extraocular movements intact.     Conjunctiva/sclera: Conjunctivae normal.     Pupils: Pupils are equal, round, and reactive to light.  Cardiovascular:     Rate and Rhythm: Normal rate and regular rhythm.     Pulses: Normal pulses.  Pulmonary:     Effort: Pulmonary effort is normal.     Breath sounds: Normal breath sounds.  Musculoskeletal:        General: Normal range of motion.     Cervical back: Normal range of motion and  neck supple.  Skin:    General: Skin is warm and dry.  Neurological:     General: No focal deficit present.     Mental Status: He is alert and oriented to person, place, and time.  Psychiatric:        Mood and Affect: Mood normal.        Behavior: Behavior normal.        Thought Content: Thought content normal.        Judgment: Judgment normal.    Assessment & Plan:   Problem List Items Addressed This Visit       Genitourinary   Bladder cancer ([place]) - Primary   Relevant Medications   phenazopyridine (PYRIDIUM) 200 MG tablet  solifenacin (VESICARE) 10 MG tablet   pregabalin (LYRICA) 100 MG capsule     Other   Hypocalcemia   Relevant Orders   Comp. Metabolic Panel (12)    Outpatient Encounter Medications as of 05/03/2021  Medication Sig   pregabalin (LYRICA) 100 MG capsule Take 1 capsule (100 mg total) by mouth 2 (two) times daily.   morphine (MSIR) 15 MG tablet Take 1 tablet (15 mg total) by mouth every 4 (four) hours as needed for severe pain. (Patient not taking: Reported on 04/18/2021)   phenazopyridine (PYRIDIUM) 200 MG tablet Take 1 tablet (200 mg total) by mouth 3 (three) times daily as needed for pain.   solifenacin (VESICARE) 10 MG tablet Take 1 tablet (10 mg total) by mouth in the morning.   [DISCONTINUED] oxybutynin (DITROPAN XL) 10 MG 24 hr tablet Take 1 tablet (10 mg total) by mouth at bedtime. (Patient not taking: Reported on 04/18/2021)   [DISCONTINUED] phenazopyridine (PYRIDIUM) 200 MG tablet Take 1 tablet (200 mg total) by mouth 3 (three) times daily. (Patient not taking: Reported on 04/18/2021)   [DISCONTINUED] solifenacin (VESICARE) 10 MG tablet Take 10 mg by mouth in the morning.   [DISCONTINUED] tolterodine (DETROL LA) 2 MG 24 hr capsule Take 2 mg by mouth daily. (Patient not taking: Reported on 04/18/2021)   Facility-Administered Encounter Medications as of 05/03/2021  Medication   gemcitabine (GEMZAR) chemo syringe for bladder instillation 2,000 mg  1.  Malignant neoplasm of urinary bladder, unspecified site [facility] Was able to determine correct medications patient has been using through last office visit with oncology in [place].  Refilled medications.  Patient was given an appointment to establish care at [facility].  Patient was given oncology phone number and encouraged to contact them as soon as possible to reschedule appointment.  Red flags given for prompt reevaluation.  Patient understands and agrees.  - phenazopyridine (PYRIDIUM) 200 MG tablet; Take 1 tablet (200 mg total) by mouth 3 (three) times daily as needed for pain.  Dispense: 90 tablet; Refill: 0 - solifenacin (VESICARE) 10 MG tablet; Take 1 tablet (10 mg total) by mouth in the morning.  Dispense: 30 tablet; Refill: 1 - pregabalin (LYRICA) 100 MG capsule; Take 1 capsule (100 mg total) by mouth 2 (two) times daily.  Dispense: 60 capsule; Refill: 1  2. Hypocalcemia  - Comp. Metabolic Panel (12)   I have reviewed the patient's medical history (PMH, PSH, Social History, Family History, Medications, and allergies) , and have been updated if relevant. I spent 36 minutes reviewing chart and  face to face time with patient.    Follow-up: Return in about 6 weeks (around 06/14/2021) for To establish PCP.   Loraine Grip Mayers, PA-C

## 2021-05-03 NOTE — Patient Instructions (Signed)
You need to call Dr. Grier Mitts office , phone number is [phone].  This will be the point of contact for your oncologist.  We will call you with today's lab results.  Kennieth Rad, PA-C Physician Assistant [facility] [email or web address]     Pregabalin Capsules What is this medication? PREGABALIN (pre GAB a lin) treats nerve pain. It may also be used to prevent and control seizures in people with epilepsy. It works by Production assistant, radio in Veterinary surgeon. This medicine may be used for other purposes; ask your health care provider orpharmacist if you have questions. COMMON BRAND NAME(S): Lyrica What should I tell my care team before I take this medication? They need to know if you have any of these conditions: Drug abuse or addiction Heart failure Kidney disease Lung disease Suicidal thoughts, plans or attempt An unusual or allergic reaction to pregabalin, other medications, foods, dyes, or preservatives Pregnant or trying to get pregnant Breast-feeding How should I use this medication? Take this medication by mouth with water. Take it as directed on the prescription label at the same time every day. You can take it with or without food. If it upsets your stomach, take it with food. Keep taking it unless yourcare team tells you to stop. A special MedGuide will be given to you by the pharmacist with eachprescription and refill. Be sure to read this information carefully each time. Talk to your care team about the use of this medication in children. While it may be prescribed for children as young as 1 month for selected conditions,precautions do apply. Overdosage: If you think you have taken too much of this medicine contact apoison control center or emergency room at once. NOTE: This medicine is only for you. Do not share this medicine with others. What if I miss a dose? If you miss a dose, take it as soon as you can. If it  is almost time for yournext dose, take only that dose. Do not take double or extra doses. What may interact with this medication? Alcohol Antihistamines for allergy, cough, and cold Certain medications for anxiety or sleep Certain medications for blood pressure, heart disease Certain medications for depression like amitriptyline, fluoxetine, sertraline Certain medications for diabetes, like pioglitazone, rosiglitazone Certain medications for seizures like phenobarbital, primidone General anesthetics like halothane, isoflurane, methoxyflurane, propofol Medications that relax muscles for surgery Narcotic medications for pain Phenothiazines like chlorpromazine, mesoridazine, prochlorperazine, thioridazine This list may not describe all possible interactions. Give your health care provider a list of all the medicines, herbs, non-prescription drugs, or dietary supplements you use. Also tell them if you smoke, drink alcohol, or use illegaldrugs. Some items may interact with your medicine. What should I watch for while using this medication? Visit your care team for regular checks on your progress. Tell your care teamif your symptoms do not start to get better or if they get worse. Do not suddenly stop taking this medication. You may develop a severe reaction. Your care team will tell you how much medication to take. If your care team wants you to stop the medication, the dose may be slowly lowered over time toavoid any side effects. You may get drowsy or dizzy. Do not drive, use machinery, or do anything that needs mental alertness until you know how this medication affects you. Do not stand up or sit up quickly, especially if you are an older patient. This reduces the risk of dizzy or fainting spells. Alcohol may interfere with theeffect of this  medication. Avoid alcoholic drinks. If you or your family notice any changes in your behavior, such as new or worsening depression, thoughts of harming  yourself, anxiety, other unusual ordisturbing thoughts, or memory loss, call your care team right away. Wear a medical ID bracelet or chain if you are taking this medication for seizures. Carry a card that describes your condition. List the medications anddoses you take on the card. This medication may make it more difficult to father a child. Talk to your careteam if you are concerned about your fertility. What side effects may I notice from receiving this medication? Side effects that you should report to your care team as soon as possible: Allergic reactions or angioedema-skin rash, itching, hives, swelling of the face, eyes, lips, tongue, arms, or legs, trouble swallowing or breathing Blurry vision Thoughts of suicide or self-harm, worsening mood, feelings of depression Trouble breathing Side effects that usually do not require medical attention (report to your careteam if they continue or are bothersome): Dizziness Drowsiness Dry mouth Nausea Swelling of the ankles, feet, hands Vomiting Weight gain This list may not describe all possible side effects. Call your doctor for medical advice about side effects. You may report side effects to FDA at1-800-FDA-1088. Where should I keep my medication? Keep out of the reach of children and pets. This medication can be abused. Keep it in a safe place to protect it from theft. Do not share it with anyone. It is only for you. Selling or giving away this medication is dangerous and againstthe law. Store at [employer] C (77 degrees F). Get rid of any unused medication afterthe expiration date. This medication may cause harm and death if it is taken by other adults, children, or pets. It is important to get rid of the medication as soon as youno longer need it, or it is expired. You can do this in two ways: Take the medication to a medication take-back program. Check with your pharmacy or law enforcement to find a location. If you cannot return the  medication, check the label or package insert to see if the medication should be thrown out in the garbage or flushed down the toilet. If you are not sure, ask your care team. If it is safe to put it in the trash, take the medication out of the container. Mix the medication with cat litter, dirt, coffee grounds, or other unwanted substance. Seal the mixture in a bag or container. Put it in the trash. NOTE: This sheet is a summary. It may not cover all possible information. If you have questions about this medicine, talk to your doctor, pharmacist, orhealth care provider.  2022 Elsevier/Gold Standard (2020-09-28 22:38:58)

## 2021-05-04 ENCOUNTER — Telehealth: Payer: Self-pay | Admitting: Hematology

## 2021-05-04 NOTE — Telephone Encounter (Signed)
Pt cld to reschedule his new pt appt w/Dr. Irene Limbo to 8/3 at 11am. Pt aware to arrive 20 minutes early.

## 2021-05-09 NOTE — Progress Notes (Signed)
HEMATOLOGY/ONCOLOGY CONSULTATION NOTE  Date of Service: 05/09/2021  Patient Care Team: Patient, No Pcp Per (Inactive) as PCP - General (General Practice)  CHIEF COMPLAINTS/PURPOSE OF CONSULTATION:  Bladder Cancer locally advanced muscle invasive  HISTORY OF PRESENTING ILLNESS:   Luis House is a wonderful 70 y.o. male who has been referred to [name] from the [facility] ED for evaluation and continued management of his locally advanced muscle invasive bladder cancer following his recent visit to ED for hematuria.  Patients daughter joins [name] by phone.  [place] receiving his care from Dr Nicki Reaper DO  His oncologic history is as follows based on outside medical records-  Diagnosis: Bladder Cancer   Date of Diagnosis: 2019  Recurrence 06/18/19   Pathology: Initial pT1 disease in 2019 from [place] (no path report available TURBT 06/18/19: A) URINARY BLADDER TUMOR (ANTERIOR WALL), TUR - PAPILLARY UROTHELIAL CARCINOMA, HIGH-GRADE - TUMOR EXTENSIVELY INVADES LAMINA PROPRIA - MUSCULARIS PROPRIA IS PRESENT WITH NO EVIDENCE OF DEFINITE INVASION   B) URINARY BLADDER TUMOR (POSTERIOR WALL), TUR - PAPILLARY UROTHELIAL CARCINOMA, HIGH-GRADE - TUMOR INVADES MUSCULARIS PROPRIA   C) URINARY BLADDER TUMOR (NECK), TUR - PAPILLARY UROTHELIAL CARCINOMA, HIGH-GRADE - TUMOR INVADES LAMINA PROPRIA - MUSCULARIS PROPRIA IS PRESENT WITH NO EVIDENCE OF DEFINITE INVASION    Cancer Stage: Initial pT1, recurrent pT2Nx Cancer Staging No matching staging information was found for the patient.   Sites of Disease: Bladder   Previous Treatment: TURBT 2019 in [place] -TURBT with intravesicular Gemcitabine 07/10/19  -Neoadjuvant Cisplatin + Gemcitabine C1 07/29/19 (stopped after Cycle 1 due to renal injury) -Concurrent chemoradiation with Gemcitabine '70mg'$ /m2 weekly completed 8 weekly cycles from 09/08/19-10/27/19.  -6 doses of BCG with interferon  intravesicular completed 12/01/2020  Current Treatment: Pembrolizumab '200mg'$  Q3 weeks: C1 03/09/21    The pt reports intermittent hematuria and lower abdominal pain. Patient daughter notes he cannot follow with [facility] due to unpaid debt from previous visits.  Lab results 04/18/2021 of CBC w/diff and CMP is as follows: all values are WNL except for Hgb of 12.4, Chloride if 119, CO2 of 20, Calcium of 6.1, Total protein of 5.4, Albumin of 2.5, Alkaline Phosphatase of 37, Total bilirubin of 1.6.  On review of systems, pt reports unquantified weight loss and denies chest pain or shortness of breath any other symptoms.   MEDICAL HISTORY:  Past Medical History:  Diagnosis Date   Arthritis    wrist and knee   Bladder tumor    Cataract    right eye   Collar bone fracture 2017   left    SURGICAL HISTORY: Past Surgical History:  Procedure Laterality Date   MULTIPLE TOOTH EXTRACTIONS     TRANSURETHRAL RESECTION OF BLADDER TUMOR N/A 01/09/2019   Procedure: TRANSURETHRAL RESECTION OF BLADDER TUMOR (TURBT)/ POST OPERATIVE INSTILLATION OF CHEMOTHERAPY;  Surgeon: Kathie Rhodes, MD;  Location: [facility];  Service: Urology;  Laterality: N/A;    SOCIAL HISTORY: Social History   Socioeconomic History   Marital status: Divorced    Spouse name: Not on file   Number of children: Not on file   Years of education: Not on file   Highest education level: Not on file  Occupational History   Not on file  Tobacco Use   Smoking status: Every Day    Packs/day: 0.25    Years: 51.00    Pack years: 12.75    Types: Cigarettes   Smokeless tobacco: Never  Vaping Use   Vaping Use: Never used  Substance and Sexual Activity   Alcohol use: Not Currently    Comment: 2 beers per day   Drug use: Never   Sexual activity: Not on file  Other Topics Concern   Not on file  Social History Narrative   Not on file   Social Determinants of Health   Financial Resource Strain: Not on file  Food  Insecurity: Not on file  Transportation Needs: Not on file  Physical Activity: Not on file  Stress: Not on file  Social Connections: Not on file  Intimate Partner Violence: Not on file    FAMILY HISTORY: No family history on file.  ALLERGIES:  has No Known Allergies.  MEDICATIONS:  Current Outpatient Medications  Medication Sig Dispense Refill   morphine (MSIR) 15 MG tablet Take 1 tablet (15 mg total) by mouth every 4 (four) hours as needed for severe pain. (Patient not taking: Reported on 04/18/2021) 4 tablet 0   phenazopyridine (PYRIDIUM) 200 MG tablet Take 1 tablet (200 mg total) by mouth 3 (three) times daily as needed for pain. 90 tablet 0   pregabalin (LYRICA) 100 MG capsule Take 1 capsule (100 mg total) by mouth 2 (two) times daily. 60 capsule 1   solifenacin (VESICARE) 10 MG tablet Take 1 tablet (10 mg total) by mouth in the morning. 30 tablet 1   No current facility-administered medications for this visit.   Facility-Administered Medications Ordered in Other Visits  Medication Dose Route Frequency Provider Last Rate Last Admin   gemcitabine (GEMZAR) chemo syringe for bladder instillation 2,000 mg  2,000 mg Bladder Instillation Once Kathie Rhodes, MD        REVIEW OF SYSTEMS:    10 Point review of Systems was done is negative except as noted above.  PHYSICAL EXAMINATION: ECOG PERFORMANCE STATUS: 2 - Symptomatic, <50% confined to bed  . Vitals:   05/10/21 1121  BP: 127/76  Pulse: 63  Resp: 18  Temp: 97.7 F (36.5 C)  SpO2: 100%   Filed Weights   05/10/21 1121  Weight: 150 lb 3.2 oz (68.1 kg)   .Body mass index is 24.24 kg/m.  NAD GENERAL:alert, in no acute distress and comfortable SKIN: no acute rashes, no significant lesions EYES: conjunctiva are pink and non-injected, sclera anicteric OROPHARYNX: MMM, no exudates, no oropharyngeal erythema or ulceration NECK: supple, no JVD LYMPH:  no palpable lymphadenopathy in the cervical, axillary or inguinal  regions LUNGS: clear to auscultation b/l with normal respiratory effort HEART: regular rate & rhythm ABDOMEN:  normoactive bowel sounds , non tender, not distended. Extremity: no pedal edema PSYCH: alert & oriented x 3 with fluent speech NEURO: no focal motor/sensory deficits  LABORATORY DATA:  I have reviewed the data as listed  . CBC Latest Ref Rng & Units 05/10/2021 04/18/2021 01/13/2020  WBC 4.0 - 10.5 K/uL 9.1 8.7 8.7  Hemoglobin 13.0 - 17.0 g/dL 12.6(L) 12.4(L) 9.6(L)  Hematocrit 39.0 - 52.0 % 38.7(L) 39.2 31.5(L)  Platelets 150 - 400 K/uL 289 316 235    . CMP Latest Ref Rng & Units 05/10/2021 04/18/2021 01/13/2020  Glucose 70 - 99 mg/dL 95 75 102(H)  BUN 8 - 23 mg/dL 24(H) 12 21  Creatinine 0.61 - 1.24 mg/dL 1.69(H) 0.88 1.93(H)  Sodium 135 - 145 mmol/L 141 142 142  Potassium 3.5 - 5.1 mmol/L 4.0 4.6 5.0  Chloride 98 - 111 mmol/L 107 119(H) 111  CO2 22 - 32 mmol/L 22 20(L) 22  Calcium 8.9 - 10.3 mg/dL 9.6 6.1(LL) 8.2(L)  Total Protein 6.5 - 8.1 g/dL 8.2(H) 5.4(L) -  Total Bilirubin 0.3 - 1.2 mg/dL 0.9 1.6(H) -  Alkaline Phos 38 - 126 U/L 71 37(L) -  AST 15 - 41 U/L 15 24 -  ALT 0 - 44 U/L 10 <5 -    RADIOGRAPHIC STUDIES: I have personally reviewed the radiological images as listed and agreed with the findings in the report.  CT Chest Abdomen Pelvis No IV or Oral Contrast  (03/03/2021)   Anatomical Region Laterality Modality  Chest -- Computed Tomography  Abdomen -- --  Pelvis -- --    Impression  1. Continued bilateral hydronephrosis and ureterectasis  consistent with obstruction from patient's bladder carcinoma. The  wall thickening of the anterior aspect of the bladder appears to  have decreased since previous study. No abdominal or pelvic  lymphadenopathy. Stable small benign-appearing nodules in both  lungs.  CT ABDOMEN PELVIS W CONTRAST  Result Date: 04/18/2021 CLINICAL DATA:  Hematuria for 2 days. Bladder cancer. Difficulty with micturition. TURBT in 2020.  EXAM: CT ABDOMEN AND PELVIS WITH CONTRAST TECHNIQUE: Multidetector CT imaging of the abdomen and pelvis was performed using the standard protocol following bolus administration of intravenous contrast. CONTRAST:  50m OMNIPAQUE IOHEXOL 350 MG/ML SOLN COMPARISON:  12/29/2018 CT abdomen/pelvis. FINDINGS: Lower chest: Basilar right lower lobe 5 mm solid pulmonary nodule (series 4/image 25) and medial left lower lobe 5 mm solid pulmonary nodule (series 4/image 27), both stable and considered benign. No acute abnormality. Hepatobiliary: Normal liver size. No liver mass. Normal gallbladder with no radiopaque cholelithiasis. No biliary ductal dilatation. Pancreas: Normal, with no mass or duct dilation. Spleen: Normal size. No mass. Adrenals/Urinary Tract: Normal adrenals. Moderate bilateral hydroureteronephrosis to the level of the ureterovesical junctions bilaterally. Subcentimeter hypodense posterior lower left renal cortical lesion is too small to characterize and unchanged, considered benign. No additional renal lesions. There is irregular circumferential bladder wall thickening narrowing the mid bladder and measuring up to 1.9 cm thickness, involving the ureterovesical junctions bilaterally (series 2/image 61). Bladder is mildly distended. No evidence of urolithiasis on this contrast-enhanced study. Stomach/Bowel: Normal non-distended stomach. Normal caliber small bowel with no small bowel wall thickening. Normal appendix. Normal large bowel with no diverticulosis, large bowel wall thickening or pericolonic fat stranding. Vascular/Lymphatic: Atherosclerotic nonaneurysmal abdominal aorta. Patent portal, splenic, hepatic and renal veins. No pathologically enlarged lymph nodes in the abdomen or pelvis. Reproductive: Top-normal size prostate. Other: No pneumoperitoneum, ascites or focal fluid collection. Small fat containing umbilical hernia is unchanged. Musculoskeletal: No aggressive appearing focal osseous lesions.  Moderate thoracolumbar spondylosis. IMPRESSION: 1. Moderate bilateral hydroureteronephrosis to the level of the ureterovesical junctions bilaterally. Nonspecific irregular circumferential bladder wall thickening narrowing the mid bladder, measuring up to 1.9 cm thickness, involving the ureterovesical junctions bilaterally. Recurrent bladder neoplasm not excluded. 2. No evidence of metastatic disease in the abdomen or pelvis. 3. Aortic Atherosclerosis (ICD10-I70.0). Electronically Signed   By: JIlona SorrelM.D.   On: 04/18/2021 13:41    ASSESSMENT & PLAN:    1. Urothelial bladder cancer, Stage II pT2N0M0  -[place] now with recurrent disease in 3 bladder tumors removed by TURBT 1 with muscle invasion on pathology. He was started on neoadjuvant Cisplatin + Gemcitabine and completed Cycle 1 but had significant renal injury that did not fully recover. His level of renal dysfunction will make him not a candidate  for further Cisplatin based chemotherapy. He switched to trimodal approach and completed concurrent chemoradiation with 8 cycles of weekly Gemcitabine in 10/2019. Decision against cystectomy with Urology given he is a poor surgical candidate. -He underwent cystoscopy in 08/2020 with no residual tumor noted on exam but bladder washings positive for high grade urothelial carcinoma cells likely representing a carcinoma in situ. His case was discussed at multidisciplinary tumor board with recommendation to consider BCG treatments given he has never had localized bladder cancer treatments. He completed 6 cycle of BCG plus interferon intravesicular late on 12/01/2020. Surveillance scans to [facility] showed no evidence of distant disease but unfortunately most recent cystoscopy from 01/2021 still with residual high-grade urothelial carcinoma. -His case was discussed again at multidisciplinary tumor board with recommendation for  immunotherapy. -Recommended starting on Keytruda 200 mg daily but patient unfortunately no showed to last 2 infusion appointments and has not started on treatment yet. He has a long history of missing appointments previously. Is able to get infusion chair for him today to go ahead and start therapy. I have ordered immunotherapy treatment plan and will continue to monitor for toxicities of therapy. Discussed importance of coming to appointments. Social work has been involved with arranging transport. -We will continue to repeat CT scans every 3 months reviewed most recent scans with patient today.  2. CKD Stage III -Renal function stable today. 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 Dysuria/Hematuria/bladder spasms prophylactic treatments for UTI in past have not helped. -has been referred to [facility] PLAN -confirmed all the diagnosis and treatment history with the patient has his daughter. -Labs today  Social worker consultation to address multiple barriers to care Referral to [facility] urology for bladder cancer management Please schedule to start pembrolizumab in 1 week- (treatment every 3 weeks, with labs) MD visit with cycle 2 of pembrolizumab for toxicity check  FOLLOW UP: Labs today  Social worker consultation to address multiple barriers to care Referral to [facility] urology for bladder cancer management Please schedule to start pembrolizumab in 1 week- (treatment every 3 weeks, with labs) MD visit with cycle 2 of pembrolizumab for toxicity check   All of the patients questions were answered with apparent satisfaction. The patient knows to call the clinic with any problems, questions or concerns.  I spent 40 minutes counseling the patient face to face. The total time spent in the appointment was 60 minutes and more than 50% was on counseling and direct patient cares.    Sullivan Lone MD [place] AAHIVMS [facility]  [facility] Hematology/Oncology Physician [facility]  (Office):       [phone] (Work cell):  [phone] (Fax):           [phone]  05/09/2021 8:01 PM  I, Reinaldo Raddle, am acting as scribe for Dr. Sullivan Lone, MD.   .I have reviewed the above documentation for accuracy and completeness, and I agree with the above. Brunetta Genera MD

## 2021-05-10 ENCOUNTER — Inpatient Hospital Stay: Payer: Medicare Other | Attending: Hematology | Admitting: Hematology

## 2021-05-10 ENCOUNTER — Inpatient Hospital Stay: Payer: Medicare Other

## 2021-05-10 ENCOUNTER — Other Ambulatory Visit: Payer: Self-pay

## 2021-05-10 VITALS — BP 127/76 | HR 63 | Temp 97.7°F | Resp 18 | Ht 66.0 in | Wt 150.2 lb

## 2021-05-10 DIAGNOSIS — N183 Chronic kidney disease, stage 3 unspecified: Secondary | ICD-10-CM | POA: Diagnosis not present

## 2021-05-10 DIAGNOSIS — R300 Dysuria: Secondary | ICD-10-CM | POA: Insufficient documentation

## 2021-05-10 DIAGNOSIS — C679 Malignant neoplasm of bladder, unspecified: Secondary | ICD-10-CM | POA: Insufficient documentation

## 2021-05-10 DIAGNOSIS — F17210 Nicotine dependence, cigarettes, uncomplicated: Secondary | ICD-10-CM | POA: Insufficient documentation

## 2021-05-10 DIAGNOSIS — N3289 Other specified disorders of bladder: Secondary | ICD-10-CM | POA: Insufficient documentation

## 2021-05-10 DIAGNOSIS — N1330 Unspecified hydronephrosis: Secondary | ICD-10-CM | POA: Insufficient documentation

## 2021-05-10 DIAGNOSIS — D649 Anemia, unspecified: Secondary | ICD-10-CM

## 2021-05-10 DIAGNOSIS — Z5112 Encounter for antineoplastic immunotherapy: Secondary | ICD-10-CM | POA: Diagnosis present

## 2021-05-10 DIAGNOSIS — Z79899 Other long term (current) drug therapy: Secondary | ICD-10-CM | POA: Insufficient documentation

## 2021-05-10 DIAGNOSIS — R319 Hematuria, unspecified: Secondary | ICD-10-CM | POA: Insufficient documentation

## 2021-05-10 LAB — CMP (CANCER CENTER ONLY)
ALT: 10 U/L (ref 0–44)
AST: 15 U/L (ref 15–41)
Albumin: 4 g/dL (ref 3.5–5.0)
Alkaline Phosphatase: 71 U/L (ref 38–126)
Anion gap: 12 (ref 5–15)
BUN: 24 mg/dL — ABNORMAL HIGH (ref 8–23)
CO2: 22 mmol/L (ref 22–32)
Calcium: 9.6 mg/dL (ref 8.9–10.3)
Chloride: 107 mmol/L (ref 98–111)
Creatinine: 1.69 mg/dL — ABNORMAL HIGH (ref 0.61–1.24)
GFR, Estimated: 43 mL/min — ABNORMAL LOW (ref >60)
Glucose, Bld: 95 mg/dL (ref 70–99)
Potassium: 4 mmol/L (ref 3.5–5.1)
Sodium: 141 mmol/L (ref 135–145)
Total Bilirubin: 0.9 mg/dL (ref 0.3–1.2)
Total Protein: 8.2 g/dL — ABNORMAL HIGH (ref 6.5–8.1)

## 2021-05-10 LAB — CBC WITH DIFFERENTIAL/PLATELET
Abs Immature Granulocytes: 0.04 10*3/uL (ref 0.00–0.07)
Basophils Absolute: 0 10*3/uL (ref 0.0–0.1)
Basophils Relative: 0 %
Eosinophils Absolute: 0.1 10*3/uL (ref 0.0–0.5)
Eosinophils Relative: 1 %
HCT: 38.7 % — ABNORMAL LOW (ref 39.0–52.0)
Hemoglobin: 12.6 g/dL — ABNORMAL LOW (ref 13.0–17.0)
Immature Granulocytes: 0 %
Lymphocytes Relative: 12 %
Lymphs Abs: 1.1 10*3/uL (ref 0.7–4.0)
MCH: 27 pg (ref 26.0–34.0)
MCHC: 32.6 g/dL (ref 30.0–36.0)
MCV: 83 fL (ref 80.0–100.0)
Monocytes Absolute: 0.7 10*3/uL (ref 0.1–1.0)
Monocytes Relative: 7 %
Neutro Abs: 7.1 10*3/uL (ref 1.7–7.7)
Neutrophils Relative %: 80 %
Platelets: 289 10*3/uL (ref 150–400)
RBC: 4.66 MIL/uL (ref 4.22–5.81)
RDW: 14.2 % (ref 11.5–15.5)
WBC: 9.1 10*3/uL (ref 4.0–10.5)
nRBC: 0 % (ref 0.0–0.2)

## 2021-05-10 LAB — LACTATE DEHYDROGENASE: LDH: 174 U/L (ref 98–192)

## 2021-05-11 ENCOUNTER — Encounter: Payer: Self-pay | Admitting: Hematology

## 2021-05-11 DIAGNOSIS — C679 Malignant neoplasm of bladder, unspecified: Secondary | ICD-10-CM | POA: Insufficient documentation

## 2021-05-11 DIAGNOSIS — Z7189 Other specified counseling: Secondary | ICD-10-CM | POA: Insufficient documentation

## 2021-05-11 LAB — IRON AND TIBC
Iron: 56 ug/dL (ref 42–163)
Saturation Ratios: 22 % (ref 20–55)
TIBC: 258 ug/dL (ref 202–409)
UIBC: 202 ug/dL (ref 117–376)

## 2021-05-11 LAB — FERRITIN: Ferritin: 240 ng/mL (ref 24–336)

## 2021-05-11 LAB — TSH: TSH: <0.08 u[IU]/mL — ABNORMAL LOW (ref 0.320–4.118)

## 2021-05-11 NOTE — Progress Notes (Signed)
START OFF PATHWAY REGIMEN - Bladder   [id number]:Pembrolizumab 200 mg IV D1 q21 Days:   A cycle is every 21 days:     Pembrolizumab   **Always confirm dose/schedule in your pharmacy ordering system**  Patient Characteristics: Pre-Cystectomy or Nonsurgical Candidate (Clinical Staging), cT2-4a, cN0-1, M0, Cystectomy Ineligible Therapeutic Status: Pre-Cystectomy or Nonsurgical Candidate (Clinical Staging) AJCC M Category: cM0 AJCC 8 Stage Grouping: IIIA AJCC T Category: cT3 AJCC N Category: cN0 Intent of Therapy: Non-Curative / Palliative Intent, Discussed with Patient

## 2021-05-15 ENCOUNTER — Telehealth: Payer: Self-pay | Admitting: Hematology

## 2021-05-15 NOTE — Telephone Encounter (Signed)
Attempted to contact pt again about appts. No answer and no VM available.

## 2021-05-15 NOTE — Telephone Encounter (Signed)
Scheduled appt per 8/3 los. Called pt, no answer and VM was not set up. Will try to call pt again later today.

## 2021-05-15 NOTE — Telephone Encounter (Signed)
I called pt again to let him know about his scheduled appts. The person who answered the phone said he was unavailable at the time, but said that he would call back. I did let them know it was urgent that he call back. They said they would let him know.

## 2021-05-16 ENCOUNTER — Telehealth: Payer: Self-pay | Admitting: *Deleted

## 2021-05-16 ENCOUNTER — Other Ambulatory Visit: Payer: Medicare Other

## 2021-05-16 ENCOUNTER — Ambulatory Visit: Payer: Medicare Other

## 2021-05-16 ENCOUNTER — Telehealth: Payer: Self-pay | Admitting: Hematology

## 2021-05-16 NOTE — Telephone Encounter (Signed)
Pt has not shown for appts today.  Tried pt's ph & unable to leave message.  Tried daughter & unavailable at this time.  Tried friend & left message to return call. Informed Dr [facility].

## 2021-05-16 NOTE — Telephone Encounter (Signed)
Per 8/9 sch msg pt missed appts for today. I did try to calling pt again today, no answer and no vm. I attempted to contact pt's daughter, again no answer and no vm. I contacted Lacie Draft, the 2nd contact in chart. No answer, but was able to leave a vm. I let a msg with with my contact information and asked either her or Mr. Fahrer to reach out to me to discuss r/s appts.

## 2021-05-17 ENCOUNTER — Telehealth: Payer: Self-pay | Admitting: Hematology

## 2021-05-17 ENCOUNTER — Telehealth: Payer: Self-pay

## 2021-05-17 ENCOUNTER — Telehealth: Payer: Self-pay | Admitting: Emergency Medicine

## 2021-05-17 NOTE — Telephone Encounter (Signed)
   Lynelle Doctor DOB: 16-Jan-1951 MRN: [id number]   RIDER WAIVER AND RELEASE OF LIABILITY  For purposes of improving physical access to our facilities, [facility] is pleased to partner with third parties to provide [place] patients or other authorized individuals the option of convenient, on-demand ground transportation services (the Technical brewer") through use of the technology service that enables users to request on-demand ground transportation from independent third-party providers.  By opting to use and accept these [employer], I, the undersigned, hereby agree on behalf of myself, and on behalf of any minor child using the Government social research officer for whom I am the parent or legal guardian, as follows:  Government social research officer provided to me are provided by independent third-party transportation providers who are not [employer] or employees and who are unaffiliated with [employer]. [facility] is neither a transportation carrier nor a common or public carrier. [facility] has no control over the quality or safety of the transportation that occurs as a result of the [employer]. [facility] cannot guarantee that any third-party transportation provider will complete any arranged transportation service. [facility] makes no representation, warranty, or guarantee regarding the reliability, timeliness, quality, safety, suitability, or availability of any of the Transport Services or that they will be error free. I fully understand that traveling by vehicle involves risks and dangers of serious bodily injury, including permanent disability, paralysis, and death. I agree, on behalf of myself and on behalf of any minor child using the Transport Services for whom I am the parent or legal guardian, that the entire risk arising out of my use of the [employer] remains solely with me, to the maximum extent permitted under applicable law. The [employer] are provided  "as is" and "as available." [facility] disclaims all representations and warranties, express, implied or statutory, not expressly set out in these terms, including the implied warranties of merchantability and fitness for a particular purpose. I hereby waive and release [facility], its agents, employees, officers, directors, representatives, insurers, attorneys, assigns, successors, subsidiaries, and affiliates from any and all past, present, or future claims, demands, liabilities, actions, causes of action, or suits of any kind directly or indirectly arising from acceptance and use of the [employer]. I further waive and release [facility] and its affiliates from all present and future liability and responsibility for any injury or death to persons or damages to property caused by or related to the use of the [employer]. I have read this Waiver and Release of Liability, and I understand the terms used in it and their legal significance. This Waiver is freely and voluntarily given with the understanding that my right (as well as the right of any minor child for whom I am the parent or legal guardian using the [employer]) to legal recourse against  in connection with the [employer] is knowingly surrendered in return for use of these services.   I attest that I read the consent document to Lynelle Doctor, gave Mr. Slavich the opportunity to ask questions and answered the questions asked (if any). I affirm that Lynelle Doctor then provided consent for he's participation in this program.     Katy Apo

## 2021-05-17 NOTE — Telephone Encounter (Signed)
Appts were scheduled per 8/1 los. I was able to reach pt's sister today and informed her of all upcoming appts. She asked if transportation could be established for pt. I send an email to transportation services to get that set up for pt. His sister is aware of appts and stated that "he will be there".

## 2021-05-17 NOTE — Telephone Encounter (Signed)
Returned call to pt's daughter . Informed her I had made the referral to Dr Warrick Parisian urology. Verified that she had office contact information. Verified daughter had all of pt's appointments.

## 2021-05-17 NOTE — Progress Notes (Signed)
Referral called in to Dr Merita Norton office.

## 2021-05-18 ENCOUNTER — Other Ambulatory Visit: Payer: Self-pay

## 2021-05-18 ENCOUNTER — Inpatient Hospital Stay: Payer: Medicare Other

## 2021-05-18 ENCOUNTER — Encounter: Payer: Self-pay | Admitting: Hematology

## 2021-05-18 ENCOUNTER — Telehealth: Payer: Self-pay | Admitting: *Deleted

## 2021-05-18 DIAGNOSIS — C679 Malignant neoplasm of bladder, unspecified: Secondary | ICD-10-CM

## 2021-05-19 ENCOUNTER — Encounter: Payer: Self-pay | Admitting: General Practice

## 2021-05-19 ENCOUNTER — Encounter: Payer: Self-pay | Admitting: Hematology

## 2021-05-19 NOTE — Progress Notes (Signed)
Called pt to introduce myself as his Arboriculturist.  Pt has 2 insurance so copay assistance isn't needed.  Pt was unavailable so I spoke with his daughter, Ms. Berenice Primas and informed her of the [employer], went over what it covers and gave the income requirement.  She stated pt would like to apply so he will bring his proof of income on 05/22/21.  If approved I will give him an expense sheet and my card for any questions or concerns he may have in the future.

## 2021-05-19 NOTE — Progress Notes (Signed)
[facility] Work  Initial Assessment   Kenson Mola is a 70 y.o. year old male contacted by phone. Unable to reach patient, spoke w sister.  Clinical Social Work was referred by medical oncology for assessment of psychosocial needs.   SDOH (Social Determinants of Health) assessments performed: Yes SDOH Interventions    Flowsheet Row Most Recent Value  SDOH Interventions   Food Insecurity Interventions Intervention Not Indicated  Financial Strain Interventions Financial Counselor  Housing Interventions --  [will send list of elderly and disabled housing]  Stress Interventions Intervention Not Indicated  Social Connections Interventions Intervention Not Indicated  Transportation Interventions [facility]  [place] and Medicaid transport]       Distress Screen completed: No No flowsheet data found.    Family/Social Information:  Housing Arrangement: patient lives with roommate in apartment, hard to afford cost of rent and utilities, wants information on subsidized housing options.  Family members/support persons in your life? Family and sister, mother, daughter and other supportive family members are very engaged w him.  Brought to [place] from [place] of [place] so he had more support as he copes w cancer treatment. Transportation concerns: yes, enrolled in [employer]  Employment: Retired. Income source: Paediatric nurse concerns: Yes, current concerns Type of concern: Utilities and Social worker access concerns: no, has Physicist, medical Religious or spiritual practice: yes, attend church w sister/family Medication Concerns: no  Services Currently in place:  none  Coping/ Adjustment to diagnosis: Patient understands treatment plan and what happens next? yes, per sister, patient has been in treatment for bladder cancer in [place].  Brought to [place] to be closer to family so they could better support him in treatment.  Family is  engaged and feel supported by current treatment team.  Concerns about diagnosis and/or treatment:  no concerns at this time per sister , hard to afford housing Patient reported stressors: Housing Hopes and priorities: connection w family. Patient enjoys  family.  Per family, he was told by physicians that he should do very little activity.  Family would like to see him able to be more active and engaged in independent activities in community.   Current coping skills/ strengths: Supportive family/friends    SUMMARY: Current SDOH Barriers:  Housing barriers  Interventions: Discussed common feeling and emotions when being diagnosed with cancer, and the importance of support during treatment Informed patient of the support team roles and support services at [facility] Provided [facility] contact information and encouraged patient to call with any questions or concerns Provided patient with information about [place] resources, will send information on elderly/disabled housing that is income based, will also send information on Senior Resources so they can get more information on community activities for seniors. Family also encouraged to talk with Medicaid transport in order to get transportation help to non [facility] appointments as needed.  Contact information provided to sister.    Follow Up Plan: Patient will contact CSW with any support or resource needs Patient verbalizes understanding of plan: Yes (sister)    Beverely Pace , [place], [place] Worker Phone:  [phone]

## 2021-05-22 ENCOUNTER — Ambulatory Visit: Payer: Medicare Other

## 2021-05-22 ENCOUNTER — Other Ambulatory Visit: Payer: Self-pay

## 2021-05-22 ENCOUNTER — Inpatient Hospital Stay: Payer: Medicare Other

## 2021-05-22 ENCOUNTER — Encounter: Payer: Self-pay | Admitting: Hematology

## 2021-05-22 VITALS — BP 111/83 | HR 71 | Temp 97.9°F | Resp 18

## 2021-05-22 DIAGNOSIS — C679 Malignant neoplasm of bladder, unspecified: Secondary | ICD-10-CM

## 2021-05-22 DIAGNOSIS — T80212A Local infection due to central venous catheter, initial encounter: Secondary | ICD-10-CM

## 2021-05-22 DIAGNOSIS — Z5112 Encounter for antineoplastic immunotherapy: Secondary | ICD-10-CM | POA: Diagnosis not present

## 2021-05-22 DIAGNOSIS — Z95828 Presence of other vascular implants and grafts: Secondary | ICD-10-CM | POA: Insufficient documentation

## 2021-05-22 LAB — CBC WITH DIFFERENTIAL/PLATELET
Abs Immature Granulocytes: 0.03 10*3/uL (ref 0.00–0.07)
Basophils Absolute: 0 10*3/uL (ref 0.0–0.1)
Basophils Relative: 1 %
Eosinophils Absolute: 0.2 10*3/uL (ref 0.0–0.5)
Eosinophils Relative: 2 %
HCT: 36.8 % — ABNORMAL LOW (ref 39.0–52.0)
Hemoglobin: 12.2 g/dL — ABNORMAL LOW (ref 13.0–17.0)
Immature Granulocytes: 0 %
Lymphocytes Relative: 11 %
Lymphs Abs: 0.9 10*3/uL (ref 0.7–4.0)
MCH: 27.5 pg (ref 26.0–34.0)
MCHC: 33.2 g/dL (ref 30.0–36.0)
MCV: 82.9 fL (ref 80.0–100.0)
Monocytes Absolute: 0.7 10*3/uL (ref 0.1–1.0)
Monocytes Relative: 8 %
Neutro Abs: 6.6 10*3/uL (ref 1.7–7.7)
Neutrophils Relative %: 78 %
Platelets: 291 10*3/uL (ref 150–400)
RBC: 4.44 MIL/uL (ref 4.22–5.81)
RDW: 14.3 % (ref 11.5–15.5)
WBC: 8.4 10*3/uL (ref 4.0–10.5)
nRBC: 0 % (ref 0.0–0.2)

## 2021-05-22 LAB — COMPREHENSIVE METABOLIC PANEL
ALT: 9 U/L (ref 0–44)
AST: 18 U/L (ref 15–41)
Albumin: 3.8 g/dL (ref 3.5–5.0)
Alkaline Phosphatase: 68 U/L (ref 38–126)
Anion gap: 11 (ref 5–15)
BUN: 20 mg/dL (ref 8–23)
CO2: 21 mmol/L — ABNORMAL LOW (ref 22–32)
Calcium: 9 mg/dL (ref 8.9–10.3)
Chloride: 108 mmol/L (ref 98–111)
Creatinine, Ser: 1.36 mg/dL — ABNORMAL HIGH (ref 0.61–1.24)
GFR, Estimated: 56 mL/min — ABNORMAL LOW (ref >60)
Glucose, Bld: 106 mg/dL — ABNORMAL HIGH (ref 70–99)
Potassium: 3.9 mmol/L (ref 3.5–5.1)
Sodium: 140 mmol/L (ref 135–145)
Total Bilirubin: 0.6 mg/dL (ref 0.3–1.2)
Total Protein: 7.6 g/dL (ref 6.5–8.1)

## 2021-05-22 MED ORDER — SODIUM CHLORIDE 0.9% FLUSH
10.0000 mL | INTRAVENOUS | Status: DC | PRN
  Administered 2021-05-22: 10 mL

## 2021-05-22 MED ORDER — SODIUM CHLORIDE 0.9 % IV SOLN: Freq: Once | INTRAVENOUS | Status: AC

## 2021-05-22 MED ORDER — HEPARIN SOD (PORK) LOCK FLUSH 100 UNIT/ML IV SOLN
500.0000 [IU] | Freq: Once | INTRAVENOUS | Status: AC | PRN
  Administered 2021-05-22: 500 [IU]

## 2021-05-22 MED ORDER — SODIUM CHLORIDE 0.9% FLUSH
10.0000 mL | Freq: Once | INTRAVENOUS | Status: AC
  Administered 2021-05-22: 10 mL
  Filled 2021-05-22: qty 10

## 2021-05-22 MED ORDER — SODIUM CHLORIDE 0.9 % IV SOLN
200.0000 mg | Freq: Once | INTRAVENOUS | Status: AC
  Administered 2021-05-22: 200 mg via INTRAVENOUS
  Filled 2021-05-22: qty 8

## 2021-05-22 NOTE — Patient Instructions (Signed)
[facility]  Discharge Instructions: Thank you for choosing [facility] to provide your oncology and hematology care.   If you have a lab appointment with the Cancer Center, please go directly to the Cancer Center and check in at the registration area.   Wear comfortable clothing and clothing appropriate for easy access to any Portacath or PICC line.   We strive to give you quality time with your provider. You may need to reschedule your appointment if you arrive late (15 or more minutes).  Arriving late affects you and other patients whose appointments are after yours.  Also, if you miss three or more appointments without notifying the office, you may be dismissed from the clinic at the provider's discretion.      For prescription refill requests, have your pharmacy contact our office and allow 72 hours for refills to be completed.    Today you received the following chemotherapy and/or immunotherapy agents: keytruda      To help prevent nausea and vomiting after your treatment, we encourage you to take your nausea medication as directed.  BELOW ARE SYMPTOMS THAT SHOULD BE REPORTED IMMEDIATELY: *FEVER GREATER THAN 100.4 F (38 C) OR HIGHER *CHILLS OR SWEATING *NAUSEA AND VOMITING THAT IS NOT CONTROLLED WITH YOUR NAUSEA MEDICATION *UNUSUAL SHORTNESS OF BREATH *UNUSUAL BRUISING OR BLEEDING *URINARY PROBLEMS (pain or burning when urinating, or frequent urination) *BOWEL PROBLEMS (unusual diarrhea, constipation, pain near the anus) TENDERNESS IN MOUTH AND THROAT WITH OR WITHOUT PRESENCE OF ULCERS (sore throat, sores in mouth, or a toothache) UNUSUAL RASH, SWELLING OR PAIN  UNUSUAL VAGINAL DISCHARGE OR ITCHING   Items with * indicate a potential emergency and should be followed up as soon as possible or go to the Emergency Department if any problems should occur.  Please show the CHEMOTHERAPY ALERT CARD or IMMUNOTHERAPY ALERT CARD at check-in to  the Emergency Department and triage nurse.  Should you have questions after your visit or need to cancel or reschedule your appointment, please contact [facility]  Dept: [phone]  and follow the prompts.  Office hours are 8:00 a.m. to 4:30 p.m. Monday - Friday. Please note that voicemails left after 4:00 p.m. may not be returned until the following business day.  We are closed weekends and major holidays. You have access to a nurse at all times for urgent questions. Please call the main number to the clinic Dept: [phone] and follow the prompts.   For any non-urgent questions, you may also contact your provider using MyChart. We now offer e-Visits for anyone 18 and older to request care online for non-urgent symptoms. For details visit [email or web address].   Also download the MyChart app! Go to the app store, search "MyChart", open the app, select [facility], and log in with your MyChart username and password.  Due to Covid, a mask is required upon entering the hospital/clinic. If you do not have a mask, one will be given to you upon arrival. For doctor visits, patients may have 1 support person aged 18 or older with them. For treatment visits, patients cannot have anyone with them due to current Covid guidelines and our immunocompromised population.   

## 2021-05-22 NOTE — Progress Notes (Deleted)
Per Dr. Alen Blew, ok to treat without lab results today.

## 2021-05-30 ENCOUNTER — Ambulatory Visit (INDEPENDENT_AMBULATORY_CARE_PROVIDER_SITE_OTHER): Payer: Medicare Other | Admitting: Primary Care

## 2021-05-31 ENCOUNTER — Ambulatory Visit (INDEPENDENT_AMBULATORY_CARE_PROVIDER_SITE_OTHER): Payer: Medicare Other | Admitting: Primary Care

## 2021-05-31 ENCOUNTER — Encounter (INDEPENDENT_AMBULATORY_CARE_PROVIDER_SITE_OTHER): Payer: Self-pay | Admitting: Primary Care

## 2021-05-31 ENCOUNTER — Other Ambulatory Visit: Payer: Self-pay

## 2021-05-31 VITALS — BP 106/73 | HR 84 | Temp 97.3°F | Ht 66.0 in | Wt 147.8 lb

## 2021-05-31 DIAGNOSIS — Z720 Tobacco use: Secondary | ICD-10-CM

## 2021-05-31 DIAGNOSIS — F17210 Nicotine dependence, cigarettes, uncomplicated: Secondary | ICD-10-CM

## 2021-05-31 DIAGNOSIS — Z7689 Persons encountering health services in other specified circumstances: Secondary | ICD-10-CM

## 2021-05-31 DIAGNOSIS — R296 Repeated falls: Secondary | ICD-10-CM

## 2021-05-31 DIAGNOSIS — C679 Malignant neoplasm of bladder, unspecified: Secondary | ICD-10-CM

## 2021-05-31 NOTE — Progress Notes (Signed)
New Patient Office Visit  Subjective:  Patient ID: Luis House, male    DOB: December 10, 1950  Age: 70 y.o. MRN: [id number]  CC:  Chief Complaint  Patient presents with  . New Patient (Initial Visit)    HPI Mr. Luis House presents for establishment of care. He uses a rollator for stability he states he has several falls . The last fall was as recent as last week trying to get up to go to the bathroom. He is having difficulty with ADL, and preparing meals.    Past Medical History:  Diagnosis Date  . Arthritis    wrist and knee  . Bladder tumor   . Cataract    right eye  . Collar bone fracture 2017   left    Past Surgical History:  Procedure Laterality Date  . MULTIPLE TOOTH EXTRACTIONS    . TRANSURETHRAL RESECTION OF BLADDER TUMOR N/A 01/09/2019   Procedure: TRANSURETHRAL RESECTION OF BLADDER TUMOR (TURBT)/ POST OPERATIVE INSTILLATION OF CHEMOTHERAPY;  Surgeon: Kathie Rhodes, MD;  Location: [facility];  Service: Urology;  Laterality: N/A;    History reviewed. No pertinent family history.  Social History   Socioeconomic History  . Marital status: Divorced    Spouse name: Not on file  . Number of children: Not on file  . Years of education: Not on file  . Highest education level: Not on file  Occupational History  . Not on file  Tobacco Use  . Smoking status: Every Day    Packs/day: 0.25    Years: 51.00    Pack years: 12.75    Types: Cigarettes  . Smokeless tobacco: Never  Vaping Use  . Vaping Use: Never used  Substance and Sexual Activity  . Alcohol use: Not Currently    Comment: 2 beers per day  . Drug use: Never  . Sexual activity: Not on file  Other Topics Concern  . Not on file  Social History Narrative  . Not on file   Social Determinants of Health   Financial Resource Strain: Medium Risk  . Difficulty of Paying Living Expenses: Somewhat hard  Food Insecurity: No Food Insecurity  . Worried About Charity fundraiser in the Last Year: Never true   . Ran Out of Food in the Last Year: Never true  Transportation Needs: Unmet Transportation Needs  . Lack of Transportation (Medical): Yes  . Lack of Transportation (Non-Medical): Yes  Physical Activity: Not on file  Stress: Stress Concern Present  . Feeling of Stress : To some extent  Social Connections: Moderately Isolated  . Frequency of Communication with Friends and Family: More than three times a week  . Frequency of Social Gatherings with Friends and Family: More than three times a week  . Attends Religious Services: More than 4 times per year  . Active Member of Clubs or Organizations: No  . Attends Archivist Meetings: Never  . Marital Status: Divorced  Human resources officer Violence: Not on file    ROS Review of Systems  Constitutional:  Positive for activity change.  Eyes:  Positive for visual disturbance.  Musculoskeletal:  Positive for gait problem.  All other systems reviewed and are negative.  Objective:   Today's Vitals: BP 106/73 (BP Location: Left Arm, Patient Position: Sitting, Cuff Size: Normal)   Pulse 84   Temp (!) 97.3 F (36.3 C) (Temporal)   Ht '5\' 6"'$  (1.676 m)   Wt 147 lb 12.8 oz (67 kg)   SpO2 97%  BMI 23.86 kg/m   Physical Exam Vitals reviewed.  Constitutional:      Comments: underweight  HENT:     Head: Normocephalic.     Right Ear: Tympanic membrane and external ear normal.     Left Ear: Tympanic membrane and external ear normal.     Nose: Nose normal.  Cardiovascular:     Rate and Rhythm: Normal rate and regular rhythm.  Pulmonary:     Effort: Pulmonary effort is normal.     Breath sounds: Normal breath sounds.  Abdominal:     General: Abdomen is flat. Bowel sounds are normal.     Palpations: Abdomen is soft.  Musculoskeletal:        General: Normal range of motion.     Cervical back: Normal range of motion and neck supple.  Skin:    General: Skin is warm and dry.  Neurological:     Mental Status: He is alert and oriented  to person, place, and time.  Psychiatric:        Mood and Affect: Mood normal.        Behavior: Behavior normal.        Thought Content: Thought content normal.        Judgment: Judgment normal.   Assessment & Plan:  Luis House was seen today for new patient (initial visit).  Diagnoses and all orders for this visit:  Tobacco abuse - I have recommended complete cessation of tobacco use. I have discussed various options available for assistance with tobacco cessation including over the counter methods (Nicotine gum, patch and lozenges). We also discussed prescription options (Chantix, Nicotine Inhaler / Nasal Spray). The patient is not interested in pursuing any prescription tobacco cessation options at this time. - Patient declines at this time.   Encounter to establish care Establish care with new PCP  Multiple falls -     Ambulatory referral to [facility]- evaluate and treat uses a rollator that was given to him. Showed how to squeeze handle for wheels to not move than get up- problem if not down will roll away and fall occurs  Malignant neoplasm of urinary bladder, unspecified site [facility]) -     Ambulatory referral to [facility]   Problem List Items Addressed This Visit   None   Outpatient Encounter Medications as of 05/31/2021  Medication Sig  . pregabalin (LYRICA) 100 MG capsule Take 1 capsule (100 mg total) by mouth 2 (two) times daily.  . solifenacin (VESICARE) 10 MG tablet Take 1 tablet (10 mg total) by mouth in the morning.  Marland Kitchen morphine (MSIR) 15 MG tablet Take 1 tablet (15 mg total) by mouth every 4 (four) hours as needed for severe pain. (Patient not taking: No sig reported)  . [DISCONTINUED] phenazopyridine (PYRIDIUM) 200 MG tablet Take 1 tablet (200 mg total) by mouth 3 (three) times daily as needed for pain.   Facility-Administered Encounter Medications as of 05/31/2021  Medication  . gemcitabine (GEMZAR) chemo syringe for bladder instillation 2,000 mg    Follow-up:  Return for prn.   [name], NP

## 2021-06-03 ENCOUNTER — Encounter: Payer: Self-pay | Admitting: Hematology

## 2021-06-05 ENCOUNTER — Encounter: Payer: Self-pay | Admitting: Hematology

## 2021-06-05 ENCOUNTER — Telehealth: Payer: Self-pay

## 2021-06-05 ENCOUNTER — Other Ambulatory Visit: Payer: Self-pay

## 2021-06-05 DIAGNOSIS — C679 Malignant neoplasm of bladder, unspecified: Secondary | ICD-10-CM

## 2021-06-05 NOTE — Telephone Encounter (Signed)
Referral received for home health : RN,PT ,HHA. Need to contact patient and inquire if he has a preference for home health agencies and confirm his best contact  number and address.    Call placed to # [phone] and the recording stated that the phone is broken and to call # [phone]. Call placed to that number and the voicemail is not set up. Need to confirm patient's contact number prior to submitting a referral.  If the home health agency is not able to reach a patient after 2-3 attempts, they will close the referral.

## 2021-06-05 NOTE — Progress Notes (Signed)
Called pt to introduce myself as his Arboriculturist.  Pt has 2 insurances so copay assistance isn't needed.  Pt was unavailable so I spoke to his daughter informing her of the [employer].  She stated pt would like to apply so they will bring his proof of income to his next visit.  If approved I will give him an expense sheet and my card for any questions or concerns he may have in the future.

## 2021-06-06 ENCOUNTER — Inpatient Hospital Stay: Payer: Medicare Other

## 2021-06-06 ENCOUNTER — Other Ambulatory Visit: Payer: Self-pay

## 2021-06-06 ENCOUNTER — Inpatient Hospital Stay (HOSPITAL_BASED_OUTPATIENT_CLINIC_OR_DEPARTMENT_OTHER): Payer: Medicare Other | Admitting: Hematology

## 2021-06-06 ENCOUNTER — Other Ambulatory Visit: Payer: Medicare Other

## 2021-06-06 ENCOUNTER — Other Ambulatory Visit: Payer: Self-pay | Admitting: Hematology

## 2021-06-06 VITALS — BP 161/103 | HR 86 | Temp 98.3°F | Resp 18 | Wt 142.4 lb

## 2021-06-06 VITALS — BP 144/82

## 2021-06-06 DIAGNOSIS — G893 Neoplasm related pain (acute) (chronic): Secondary | ICD-10-CM | POA: Diagnosis not present

## 2021-06-06 DIAGNOSIS — C679 Malignant neoplasm of bladder, unspecified: Secondary | ICD-10-CM

## 2021-06-06 DIAGNOSIS — Z5112 Encounter for antineoplastic immunotherapy: Secondary | ICD-10-CM | POA: Diagnosis not present

## 2021-06-06 DIAGNOSIS — Z7189 Other specified counseling: Secondary | ICD-10-CM

## 2021-06-06 DIAGNOSIS — Z95828 Presence of other vascular implants and grafts: Secondary | ICD-10-CM

## 2021-06-06 LAB — CMP (CANCER CENTER ONLY)
ALT: 10 U/L (ref 0–44)
AST: 19 U/L (ref 15–41)
Albumin: 4.1 g/dL (ref 3.5–5.0)
Alkaline Phosphatase: 68 U/L (ref 38–126)
Anion gap: 12 (ref 5–15)
BUN: 19 mg/dL (ref 8–23)
CO2: 23 mmol/L (ref 22–32)
Calcium: 9.1 mg/dL (ref 8.9–10.3)
Chloride: 107 mmol/L (ref 98–111)
Creatinine: 1.74 mg/dL — ABNORMAL HIGH (ref 0.61–1.24)
GFR, Estimated: 42 mL/min — ABNORMAL LOW (ref >60)
Glucose, Bld: 90 mg/dL (ref 70–99)
Potassium: 3.9 mmol/L (ref 3.5–5.1)
Sodium: 142 mmol/L (ref 135–145)
Total Bilirubin: 0.8 mg/dL (ref 0.3–1.2)
Total Protein: 8 g/dL (ref 6.5–8.1)

## 2021-06-06 LAB — CBC WITH DIFFERENTIAL (CANCER CENTER ONLY)
Abs Immature Granulocytes: 0.03 10*3/uL (ref 0.00–0.07)
Basophils Absolute: 0 10*3/uL (ref 0.0–0.1)
Basophils Relative: 0 %
Eosinophils Absolute: 0.1 10*3/uL (ref 0.0–0.5)
Eosinophils Relative: 2 %
HCT: 39.9 % (ref 39.0–52.0)
Hemoglobin: 12.6 g/dL — ABNORMAL LOW (ref 13.0–17.0)
Immature Granulocytes: 0 %
Lymphocytes Relative: 10 %
Lymphs Abs: 1 10*3/uL (ref 0.7–4.0)
MCH: 27.2 pg (ref 26.0–34.0)
MCHC: 31.6 g/dL (ref 30.0–36.0)
MCV: 86 fL (ref 80.0–100.0)
Monocytes Absolute: 0.7 10*3/uL (ref 0.1–1.0)
Monocytes Relative: 7 %
Neutro Abs: 7.5 10*3/uL (ref 1.7–7.7)
Neutrophils Relative %: 81 %
Platelet Count: 289 10*3/uL (ref 150–400)
RBC: 4.64 MIL/uL (ref 4.22–5.81)
RDW: 15 % (ref 11.5–15.5)
WBC Count: 9.3 10*3/uL (ref 4.0–10.5)
nRBC: 0 % (ref 0.0–0.2)

## 2021-06-06 MED ORDER — OXYCODONE HCL 5 MG PO TABS: 5.0000 mg | ORAL_TABLET | Freq: Once | ORAL | Status: DC | PRN

## 2021-06-06 MED ORDER — HEPARIN SOD (PORK) LOCK FLUSH 100 UNIT/ML IV SOLN
500.0000 [IU] | Freq: Once | INTRAVENOUS | Status: AC | PRN
  Administered 2021-06-06: 500 [IU]

## 2021-06-06 MED ORDER — SODIUM CHLORIDE 0.9% FLUSH
10.0000 mL | Freq: Once | INTRAVENOUS | Status: AC
  Administered 2021-06-06: 10 mL

## 2021-06-06 MED ORDER — SODIUM CHLORIDE 0.9 % IV SOLN: 200.0000 mg | Freq: Once | INTRAVENOUS | Status: DC

## 2021-06-06 MED ORDER — SODIUM CHLORIDE 0.9 % IV SOLN: Freq: Once | INTRAVENOUS | Status: AC

## 2021-06-06 MED ORDER — SODIUM CHLORIDE 0.9% FLUSH
10.0000 mL | INTRAVENOUS | Status: DC | PRN
  Administered 2021-06-06: 10 mL

## 2021-06-06 NOTE — Telephone Encounter (Signed)
Attempted to contact patient again to inquire if he has a preference for home health agencies and to confirm his best contact  number and address.     Call placed to # [phone] and the recording stated that the phone is broken and to call # [phone]. Call placed to that number and the voicemail is not set up. Need to confirm patient's contact number prior to submitting a referral.  If the home health agency is not able to reach a patient after 2-3 attempts, they will close the referral.

## 2021-06-06 NOTE — Addendum Note (Signed)
Addended by: Margaret Pyle on: 06/06/2021 01:20 PM   Modules accepted: Orders

## 2021-06-06 NOTE — Progress Notes (Signed)
Okay to treat with creatinine 1.74 per Dr. Irene Limbo.  Cancel today's treatment, 1 week too early. Per Dr. Irene Limbo.

## 2021-06-07 ENCOUNTER — Telehealth: Payer: Self-pay | Admitting: Hematology

## 2021-06-07 NOTE — Telephone Encounter (Signed)
Scheduled follow-up appointment per 8/30 los. Patient's daughter is aware.

## 2021-06-08 NOTE — Telephone Encounter (Signed)
Call placed to patient, spoke to his daughter, Mliss Sax. Explained that Ms Oletta Lamas, NP placed a referral for home health RN/PT and inquire if patient had a preference for home health agencies. She said he did not. He did have a preference for [facility] agencies. This CM explained the difference between home health care and PCS.   Explained that the home health referral would be sent out to home heath providers in this area. There is no guarantee that the referral will be accepted. The agency must be in network with his insurance and they need to have staff available to provide the services.  She said she understood. Referral then sent to [facility] for review.   She was most interested in [facility]. This CM explained that PCS is provided through [employer].  They will contact patient to do an in home assessment and if approved for services, he can then pick the agency that he prefers to provide the services as long as they are in network with [facility]. Informed her that the referral for PCS has already been sent to [facility] so he needs to make sure to return any messages that he receives from [facility].

## 2021-06-13 ENCOUNTER — Encounter: Payer: Self-pay | Admitting: Hematology

## 2021-06-13 NOTE — Progress Notes (Signed)
HEMATOLOGY/ONCOLOGY CLINIC NOTE  Date of Service: 06/13/2021  Patient Care Team: [name], NP as PCP - General (Internal Medicine)  CHIEF COMPLAINTS/PURPOSE OF CONSULTATION:  Bladder Cancer locally advanced muscle invasive  HISTORY OF PRESENTING ILLNESS:   Luis House is a wonderful 70 y.o. male who has been referred to [name] from the [facility] ED for evaluation and continued management of his locally advanced muscle invasive bladder cancer following his recent visit to ED for hematuria.  Patients daughter joins [name] by phone.  Patient has recently moved from [place], [place] where he was receiving his care from Dr Nicki Reaper DO  His oncologic history is as follows based on outside medical records-  Diagnosis: Bladder Cancer   Date of Diagnosis: 2019  Recurrence 06/18/19   Pathology: Initial pT1 disease in 2019 from [place] (no path report available TURBT 06/18/19: A) URINARY BLADDER TUMOR (ANTERIOR WALL), TUR - PAPILLARY UROTHELIAL CARCINOMA, HIGH-GRADE - TUMOR EXTENSIVELY INVADES LAMINA PROPRIA - MUSCULARIS PROPRIA IS PRESENT WITH NO EVIDENCE OF DEFINITE INVASION   B) URINARY BLADDER TUMOR (POSTERIOR WALL), TUR - PAPILLARY UROTHELIAL CARCINOMA, HIGH-GRADE - TUMOR INVADES MUSCULARIS PROPRIA   C) URINARY BLADDER TUMOR (NECK), TUR - PAPILLARY UROTHELIAL CARCINOMA, HIGH-GRADE - TUMOR INVADES LAMINA PROPRIA - MUSCULARIS PROPRIA IS PRESENT WITH NO EVIDENCE OF DEFINITE INVASION    Cancer Stage: Initial pT1, recurrent pT2Nx Cancer Staging No matching staging information was found for the patient.   Sites of Disease: Bladder   Previous Treatment: TURBT 2019 in [place] -TURBT with intravesicular Gemcitabine 07/10/19  -Neoadjuvant Cisplatin + Gemcitabine C1 07/29/19 (stopped after Cycle 1 due to renal injury) -Concurrent chemoradiation with Gemcitabine '70mg'$ /m2 weekly completed 8 weekly cycles from 09/08/19-10/27/19.  -6 doses of BCG with interferon . completed  12/01/2020  Current Treatment: Pembrolizumab '200mg'$  Q3 weeks: C1 03/09/21    The pt reports intermittent hematuria and lower abdominal pain. Patient daughter notes he cannot follow with [facility] due to unpaid debt from previous visits.  Lab results 04/18/2021 of CBC w/diff and CMP is as follows: all values are WNL except for Hgb of 12.4, Chloride if 119, CO2 of 20, Calcium of 6.1, Total protein of 5.4, Albumin of 2.5, Alkaline Phosphatase of 37, Total bilirubin of 1.6.  On review of systems, pt reports unquantified weight loss and denies chest pain or shortness of breath any other symptoms.  INTERVAL HISTORY  Patient is here for follow-up of his locally advanced urothelial cancer for toxicity check after his second cycle of pembrolizumab. He was seen by urology at [facility].  A lot for radical cystectomy.  He is scheduled for TURBT with resection of multiple bladder tumors noted on recent cystoscopy. He also saw medical oncology at [facility].  No new recommendations pending additional TURBT. Concerned about his functional status and considering pursuing best supportive cares. He continues to have bladder spasms and dysuria which is being managed by urology. No fevers no chills no night sweats. No overt adverse effects from his previous cycle of pembrolizumab.   MEDICAL HISTORY:  Past Medical History:  Diagnosis Date   Arthritis    wrist and knee   Bladder tumor    Cataract    right eye   Collar bone fracture 2017   left    SURGICAL HISTORY: Past Surgical History:  Procedure Laterality Date   MULTIPLE TOOTH EXTRACTIONS     TRANSURETHRAL RESECTION OF BLADDER TUMOR N/A 01/09/2019   Procedure: TRANSURETHRAL RESECTION OF BLADDER TUMOR (TURBT)/  POST OPERATIVE INSTILLATION OF CHEMOTHERAPY;  Surgeon: Kathie Rhodes, MD;  Location: [facility];  Service: Urology;  Laterality: N/A;    SOCIAL HISTORY: Social History   Socioeconomic History   Marital status:  Divorced    Spouse name: Not on file   Number of children: Not on file   Years of education: Not on file   Highest education level: Not on file  Occupational History   Not on file  Tobacco Use   Smoking status: Every Day    Packs/day: 0.25    Years: 51.00    Pack years: 12.75    Types: Cigarettes   Smokeless tobacco: Never  Vaping Use   Vaping Use: Never used  Substance and Sexual Activity   Alcohol use: Not Currently    Comment: 2 beers per day   Drug use: Never   Sexual activity: Not on file  Other Topics Concern   Not on file  Social History Narrative   Not on file   Social Determinants of Health   Financial Resource Strain: Medium Risk   Difficulty of Paying Living Expenses: Somewhat hard  Food Insecurity: No Food Insecurity   Worried About Charity fundraiser in the Last Year: Never true   Ran Out of Food in the Last Year: Never true  Transportation Needs: Unmet Transportation Needs   Lack of Transportation (Medical): Yes   Lack of Transportation (Non-Medical): Yes  Physical Activity: Not on file  Stress: Stress Concern Present   Feeling of Stress : To some extent  Social Connections: Moderately Isolated   Frequency of Communication with Friends and Family: More than three times a week   Frequency of Social Gatherings with Friends and Family: More than three times a week   Attends Religious Services: More than 4 times per year   Active Member of [employer] or Organizations: No   Attends Archivist Meetings: Never   Marital Status: Divorced  Human resources officer Violence: Not on file    FAMILY HISTORY: No family history on file.  ALLERGIES:  has No Known Allergies.  MEDICATIONS:  Current Outpatient Medications  Medication Sig Dispense Refill   morphine (MSIR) 15 MG tablet Take 1 tablet (15 mg total) by mouth every 4 (four) hours as needed for severe pain. (Patient not taking: No sig reported) 4 tablet 0   pregabalin (LYRICA) 100 MG capsule Take 1 capsule  (100 mg total) by mouth 2 (two) times daily. 60 capsule 1   solifenacin (VESICARE) 10 MG tablet Take 1 tablet (10 mg total) by mouth in the morning. 30 tablet 1   Current Facility-Administered Medications  Medication Dose Route Frequency Provider Last Rate Last Admin   oxyCODONE (Oxy IR/ROXICODONE) immediate release tablet 5 mg  5 mg Oral Once PRN Brunetta Genera, MD       Facility-Administered Medications Ordered in Other Visits  Medication Dose Route Frequency Provider Last Rate Last Admin   gemcitabine (GEMZAR) chemo syringe for bladder instillation 2,000 mg  2,000 mg Bladder Instillation Once Kathie Rhodes, MD        REVIEW OF SYSTEMS:    .10 Point review of Systems was done is negative except as noted above.  PHYSICAL EXAMINATION: ECOG PERFORMANCE STATUS: 2 - Symptomatic, <50% confined to bed  . Vitals:   06/06/21 1049  BP: (!) 161/103  Pulse: 86  Resp: 18  Temp: 98.3 F (36.8 C)  SpO2: 100%   Filed Weights   06/06/21 1049  Weight: 142 lb 6.4  oz (64.6 kg)   .Body mass index is 22.98 kg/m.  NAD. GENERAL:alert, in no acute distress and comfortable SKIN: no acute rashes, no significant lesions EYES: conjunctiva are pink and non-injected, sclera anicteric OROPHARYNX: MMM, no exudates, no oropharyngeal erythema or ulceration NECK: supple, no JVD LYMPH:  no palpable lymphadenopathy in the cervical, axillary or inguinal regions LUNGS: clear to auscultation b/l with normal respiratory effort HEART: regular rate & rhythm ABDOMEN:  normoactive bowel sounds , non tender, not distended. Extremity: no pedal edema PSYCH: alert & oriented x 3 with fluent speech NEURO: no focal motor/sensory deficits  LABORATORY DATA:  I have reviewed the data as listed  . CBC Latest Ref Rng & Units 06/06/2021 05/22/2021 05/10/2021  WBC 4.0 - 10.5 K/uL 9.3 8.4 9.1  Hemoglobin 13.0 - 17.0 g/dL 12.6(L) 12.2(L) 12.6(L)  Hematocrit 39.0 - 52.0 % 39.9 36.8(L) 38.7(L)  Platelets 150 - 400  K/uL 289 291 289    . CMP Latest Ref Rng & Units 06/06/2021 05/22/2021 05/10/2021  Glucose 70 - 99 mg/dL 90 106(H) 95  BUN 8 - 23 mg/dL 19 20 24(H)  Creatinine 0.61 - 1.24 mg/dL 1.74(H) 1.36(H) 1.69(H)  Sodium 135 - 145 mmol/L 142 140 141  Potassium 3.5 - 5.1 mmol/L 3.9 3.9 4.0  Chloride 98 - 111 mmol/L 107 108 107  CO2 22 - 32 mmol/L 23 21(L) 22  Calcium 8.9 - 10.3 mg/dL 9.1 9.0 9.6  Total Protein 6.5 - 8.1 g/dL 8.0 7.6 8.2(H)  Total Bilirubin 0.3 - 1.2 mg/dL 0.8 0.6 0.9  Alkaline Phos 38 - 126 U/L 68 68 71  AST 15 - 41 U/L '19 18 15  '$ ALT 0 - 44 U/L '10 9 10    '$ RADIOGRAPHIC STUDIES: I have personally reviewed the radiological images as listed and agreed with the findings in the report.  CT Chest Abdomen Pelvis No IV or Oral Contrast  (03/03/2021)   Anatomical Region Laterality Modality  Chest -- Computed Tomography  Abdomen -- --  Pelvis -- --    Impression  1. Continued bilateral hydronephrosis and ureterectasis  consistent with obstruction from patient's bladder carcinoma. The  wall thickening of the anterior aspect of the bladder appears to  have decreased since previous study. No abdominal or pelvic  lymphadenopathy. Stable small benign-appearing nodules in both  lungs.  No results found.  ASSESSMENT & PLAN:    1. Urothelial bladder cancer, Stage II pT2N0M0  -Initial diagnosis of stage I bladder cancer in 2019 in [place] treated with TURBT, now with recurrent disease in 3 bladder tumors removed by TURBT 1 with muscle invasion on pathology. He was started on neoadjuvant Cisplatin + Gemcitabine and completed Cycle 1 but had significant renal injury that did not fully recover. His level of renal dysfunction will make him not a candidate for further Cisplatin based chemotherapy. He switched to trimodal approach and completed concurrent chemoradiation with 8 cycles of weekly Gemcitabine in 10/2019. Decision against cystectomy with Urology given he is a poor surgical  candidate. -He underwent cystoscopy in 08/2020 with no residual tumor noted on exam but bladder washings positive for high grade urothelial carcinoma cells likely representing a carcinoma in situ. His case was discussed at multidisciplinary tumor board with recommendation to consider BCG treatments given he has never had localized bladder cancer treatments. He completed 6 cycle of BCG plus interferon intravesicular late on 12/01/2020. Surveillance scans to [facility] showed no evidence of distant disease but unfortunately most recent cystoscopy from 01/2021 still with  residual high-grade urothelial carcinoma. -His case was discussed again at multidisciplinary tumor board with recommendation for immunotherapy. -Recommended starting on Keytruda 200 mg daily but patient unfortunately no showed to last 2 infusion appointments and has not started on treatment yet. He has a long history of missing appointments previously. Is able to get infusion chair for him today to go ahead and start therapy. I have ordered immunotherapy treatment plan and will continue to monitor for toxicities of therapy. Discussed importance of coming to appointments. Social work has been involved with arranging transport. -We will continue to repeat CT scans every 3 months reviewed most recent scans with patient today.  2. CKD Stage III -Renal function stable today. 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 Dysuria/Hematuria/bladder spasms prophylactic treatments for UTI in past have not helped. -has been referred to [facility] PLAN -reviewed records from [place] today reviewed -no prohibitive toxicities from C2 of Pembrolizumab -f/u with Dr Kandice Hams for TURBT as planned -will scheduled next cycle of Pembrolizumab on 9/20--pending results of TURBT. - FOLLOW UP: Please schedule next cycle of Keytruda with port flush labs and MD visit in 3 weeks. Patient to follow-up with Dr.  Donalee Citrin /Urology at [facility] for his planned TURBT Referral to [facility] for supportive cares at home    All of the patients questions were answered with apparent satisfaction. The patient knows to call the clinic with any problems, questions or concerns. . The total time spent in the appointment was 32 minutes and more than 50% was on counseling and direct patient cares.     Sullivan Lone MD [place] AAHIVMS [facility] Hematology/Oncology Physician [facility]  (Office):       [phone] (Work cell):  [phone] (Fax):           [phone]

## 2021-06-14 ENCOUNTER — Telehealth: Payer: Self-pay

## 2021-06-14 NOTE — Telephone Encounter (Signed)
Call place to [place] to check on status of referral. Spoke to [place] who said the referral has been declined.

## 2021-06-15 NOTE — Telephone Encounter (Signed)
Call placed to [facility], spoke to [place] who said they are not able to accept the referral.  Call placed to [facility], spoke to [facility] who requested referral be faxed for review. She explained that SN would not start until early next week and there would be a delay in PT start of care, possibly over a week. She requested acknowledgement of this delay be documented on the referral. Referral then faxed to # [phone]

## 2021-06-19 ENCOUNTER — Telehealth: Payer: Self-pay

## 2021-06-19 NOTE — Telephone Encounter (Signed)
Call placed to [facility], spoke to [place] who said that they are not able to accept the referral because the patient's insurance is out of network.   Call placed to [facility], spoke to [place] who said they will not be able to accept the referral due to staffing.   Call placed to [facility], spoke to [place] who explained that the referral will be reviewed in Epic and she will call this CM back with decision regarding accepting the referral.

## 2021-06-19 NOTE — Telephone Encounter (Signed)
Call placed to [facility], spoke to [place] who confirmed referral has been received and processed and they need to contact the patient to schedule an assessment.

## 2021-06-19 NOTE — Telephone Encounter (Signed)
Message received from [facility] noting that they are not able to accept the referral.

## 2021-06-20 NOTE — Telephone Encounter (Signed)
Call placed to [facility], spoke to [facility] who requested referral be faxed for review.  Referral then faxed as requested # [phone]

## 2021-06-21 NOTE — Telephone Encounter (Signed)
Call placed to [facility], spoke to [place] who said they are not able to accept the referral.  Call placed to [facility] # [phone] , spoke to [place] who said they are not accepting any referrals right now.   Call placed to [facility] # [phone], spoke to [facility] who requested referral be faxed for review. It was then faxed to # [phone] as requested.

## 2021-06-22 NOTE — Telephone Encounter (Signed)
Tammy from [facility] called in to let someone know they were not able to accept the referral. Please advise.

## 2021-06-22 NOTE — Telephone Encounter (Signed)
Call placed to [facility] # [phone], spoke to [place] who requested referral be faxed for review.   Referral then faxed to # [phone].

## 2021-06-27 ENCOUNTER — Inpatient Hospital Stay: Payer: Medicare Other

## 2021-06-27 ENCOUNTER — Inpatient Hospital Stay: Payer: Medicare Other | Attending: Hematology | Admitting: Hematology

## 2021-06-27 DIAGNOSIS — F17210 Nicotine dependence, cigarettes, uncomplicated: Secondary | ICD-10-CM | POA: Insufficient documentation

## 2021-06-27 DIAGNOSIS — Z79899 Other long term (current) drug therapy: Secondary | ICD-10-CM | POA: Insufficient documentation

## 2021-06-27 DIAGNOSIS — C679 Malignant neoplasm of bladder, unspecified: Secondary | ICD-10-CM | POA: Insufficient documentation

## 2021-06-27 DIAGNOSIS — Z5112 Encounter for antineoplastic immunotherapy: Secondary | ICD-10-CM | POA: Insufficient documentation

## 2021-06-27 DIAGNOSIS — N183 Chronic kidney disease, stage 3 unspecified: Secondary | ICD-10-CM | POA: Insufficient documentation

## 2021-06-27 NOTE — Telephone Encounter (Signed)
Multiple calls placed to [facility] # [phone] x 2; [phone] x 3 and [phone] x [phone] - messages left at each number requesting a call back to this CM.

## 2021-06-27 NOTE — Telephone Encounter (Signed)
[place], spoke to [place] who said they are not in network with patient's insurance and are not able to accept the referral.

## 2021-07-04 ENCOUNTER — Encounter: Payer: Self-pay | Admitting: Hematology

## 2021-07-04 ENCOUNTER — Inpatient Hospital Stay: Payer: Medicare Other

## 2021-07-04 ENCOUNTER — Inpatient Hospital Stay: Payer: Medicare Other | Admitting: Hematology

## 2021-07-04 ENCOUNTER — Ambulatory Visit (INDEPENDENT_AMBULATORY_CARE_PROVIDER_SITE_OTHER): Payer: Medicare Other | Admitting: Primary Care

## 2021-07-06 ENCOUNTER — Encounter: Payer: Self-pay | Admitting: Hematology

## 2021-07-06 NOTE — Telephone Encounter (Signed)
Call placed to [facility] # [phone] regarding referral.  Spoke to [facility] and informed her of difficulty reaching anyone at [facility].  She requested referral be faxed for review to # [phone]. She noted that they are in network with some [facility] plans but not all.  Referral then faxed as requested

## 2021-07-07 ENCOUNTER — Inpatient Hospital Stay: Payer: Medicare Other

## 2021-07-07 ENCOUNTER — Inpatient Hospital Stay (HOSPITAL_BASED_OUTPATIENT_CLINIC_OR_DEPARTMENT_OTHER): Payer: Medicare Other | Admitting: Hematology

## 2021-07-07 ENCOUNTER — Other Ambulatory Visit: Payer: Self-pay

## 2021-07-07 ENCOUNTER — Telehealth: Payer: Self-pay | Admitting: Primary Care

## 2021-07-07 ENCOUNTER — Encounter: Payer: Self-pay | Admitting: Hematology

## 2021-07-07 VITALS — BP 128/66 | HR 71 | Temp 98.1°F | Resp 18

## 2021-07-07 DIAGNOSIS — N183 Chronic kidney disease, stage 3 unspecified: Secondary | ICD-10-CM | POA: Diagnosis not present

## 2021-07-07 DIAGNOSIS — F17210 Nicotine dependence, cigarettes, uncomplicated: Secondary | ICD-10-CM | POA: Diagnosis not present

## 2021-07-07 DIAGNOSIS — C679 Malignant neoplasm of bladder, unspecified: Secondary | ICD-10-CM

## 2021-07-07 DIAGNOSIS — Z7189 Other specified counseling: Secondary | ICD-10-CM

## 2021-07-07 DIAGNOSIS — Z95828 Presence of other vascular implants and grafts: Secondary | ICD-10-CM

## 2021-07-07 DIAGNOSIS — Z79899 Other long term (current) drug therapy: Secondary | ICD-10-CM | POA: Diagnosis not present

## 2021-07-07 DIAGNOSIS — G893 Neoplasm related pain (acute) (chronic): Secondary | ICD-10-CM | POA: Diagnosis not present

## 2021-07-07 DIAGNOSIS — Z5112 Encounter for antineoplastic immunotherapy: Secondary | ICD-10-CM | POA: Diagnosis present

## 2021-07-07 LAB — CBC WITH DIFFERENTIAL (CANCER CENTER ONLY)
Abs Immature Granulocytes: 0.03 10*3/uL (ref 0.00–0.07)
Basophils Absolute: 0 10*3/uL (ref 0.0–0.1)
Basophils Relative: 1 %
Eosinophils Absolute: 0.1 10*3/uL (ref 0.0–0.5)
Eosinophils Relative: 2 %
HCT: 36.8 % — ABNORMAL LOW (ref 39.0–52.0)
Hemoglobin: 11.8 g/dL — ABNORMAL LOW (ref 13.0–17.0)
Immature Granulocytes: 0 %
Lymphocytes Relative: 13 %
Lymphs Abs: 1.1 10*3/uL (ref 0.7–4.0)
MCH: 27.6 pg (ref 26.0–34.0)
MCHC: 32.1 g/dL (ref 30.0–36.0)
MCV: 86.2 fL (ref 80.0–100.0)
Monocytes Absolute: 0.6 10*3/uL (ref 0.1–1.0)
Monocytes Relative: 7 %
Neutro Abs: 6.5 10*3/uL (ref 1.7–7.7)
Neutrophils Relative %: 77 %
Platelet Count: 256 10*3/uL (ref 150–400)
RBC: 4.27 MIL/uL (ref 4.22–5.81)
RDW: 15.5 % (ref 11.5–15.5)
WBC Count: 8.3 10*3/uL (ref 4.0–10.5)
nRBC: 0 % (ref 0.0–0.2)

## 2021-07-07 LAB — CMP (CANCER CENTER ONLY)
ALT: 10 U/L (ref 0–44)
AST: 14 U/L — ABNORMAL LOW (ref 15–41)
Albumin: 3.6 g/dL (ref 3.5–5.0)
Alkaline Phosphatase: 70 U/L (ref 38–126)
Anion gap: 10 (ref 5–15)
BUN: 21 mg/dL (ref 8–23)
CO2: 25 mmol/L (ref 22–32)
Calcium: 9 mg/dL (ref 8.9–10.3)
Chloride: 108 mmol/L (ref 98–111)
Creatinine: 1.18 mg/dL (ref 0.61–1.24)
GFR, Estimated: >60 mL/min (ref >60)
Glucose, Bld: 96 mg/dL (ref 70–99)
Potassium: 3.8 mmol/L (ref 3.5–5.1)
Sodium: 143 mmol/L (ref 135–145)
Total Bilirubin: 0.8 mg/dL (ref 0.3–1.2)
Total Protein: 7.1 g/dL (ref 6.5–8.1)

## 2021-07-07 MED ORDER — SODIUM CHLORIDE 0.9% FLUSH
10.0000 mL | Freq: Once | INTRAVENOUS | Status: AC
  Administered 2021-07-07: 10 mL

## 2021-07-07 MED ORDER — HEPARIN SOD (PORK) LOCK FLUSH 100 UNIT/ML IV SOLN
500.0000 [IU] | Freq: Once | INTRAVENOUS | Status: AC | PRN
  Administered 2021-07-07: 500 [IU]

## 2021-07-07 MED ORDER — SODIUM CHLORIDE 0.9% FLUSH
10.0000 mL | INTRAVENOUS | Status: DC | PRN
  Administered 2021-07-07: 10 mL

## 2021-07-07 MED ORDER — SODIUM CHLORIDE 0.9 % IV SOLN: Freq: Once | INTRAVENOUS | Status: AC

## 2021-07-07 MED ORDER — SODIUM CHLORIDE 0.9 % IV SOLN
200.0000 mg | Freq: Once | INTRAVENOUS | Status: AC
  Administered 2021-07-07: 200 mg via INTRAVENOUS
  Filled 2021-07-07: qty 8

## 2021-07-07 NOTE — Telephone Encounter (Signed)
Copied from [place] [phone]. Topic: General - Other >> Jul 07, 2021  8:18 AM Leward Quan A wrote: Reason for CRM: Ronalee Belts with [facility] called in to inform Opal Sidles B that they receive the referral but that they are not able to take this referral at this time, no other explanation given. Any questions please call Alyse Low at Ph#  [phone] >> Jul 07, 2021  8:38 AM Jodie Echevaria wrote: Alyse Low with [facility] called back to tell Opal Sidles B to disregard the first message they are accepting the referral . Please be advised

## 2021-07-07 NOTE — Patient Instructions (Signed)
[facility] MEDICAL ONCOLOGY  Discharge Instructions: Thank you for choosing [facility] to provide your oncology and hematology care.   If you have a lab appointment with the Cancer Center, please go directly to the Cancer Center and check in at the registration area.   Wear comfortable clothing and clothing appropriate for easy access to any Portacath or PICC line.   We strive to give you quality time with your provider. You may need to reschedule your appointment if you arrive late (15 or more minutes).  Arriving late affects you and other patients whose appointments are after yours.  Also, if you miss three or more appointments without notifying the office, you may be dismissed from the clinic at the provider's discretion.      For prescription refill requests, have your pharmacy contact our office and allow 72 hours for refills to be completed.    Today you received the following chemotherapy and/or immunotherapy agents: keytruda      To help prevent nausea and vomiting after your treatment, we encourage you to take your nausea medication as directed.  BELOW ARE SYMPTOMS THAT SHOULD BE REPORTED IMMEDIATELY: *FEVER GREATER THAN 100.4 F (38 C) OR HIGHER *CHILLS OR SWEATING *NAUSEA AND VOMITING THAT IS NOT CONTROLLED WITH YOUR NAUSEA MEDICATION *UNUSUAL SHORTNESS OF BREATH *UNUSUAL BRUISING OR BLEEDING *URINARY PROBLEMS (pain or burning when urinating, or frequent urination) *BOWEL PROBLEMS (unusual diarrhea, constipation, pain near the anus) TENDERNESS IN MOUTH AND THROAT WITH OR WITHOUT PRESENCE OF ULCERS (sore throat, sores in mouth, or a toothache) UNUSUAL RASH, SWELLING OR PAIN  UNUSUAL VAGINAL DISCHARGE OR ITCHING   Items with * indicate a potential emergency and should be followed up as soon as possible or go to the Emergency Department if any problems should occur.  Please show the CHEMOTHERAPY ALERT CARD or IMMUNOTHERAPY ALERT CARD at check-in to  the Emergency Department and triage nurse.  Should you have questions after your visit or need to cancel or reschedule your appointment, please contact [facility] MEDICAL ONCOLOGY  Dept: [phone]  and follow the prompts.  Office hours are 8:00 a.m. to 4:30 p.m. Monday - Friday. Please note that voicemails left after 4:00 p.m. may not be returned until the following business day.  We are closed weekends and major holidays. You have access to a nurse at all times for urgent questions. Please call the main number to the clinic Dept: [phone] and follow the prompts.   For any non-urgent questions, you may also contact your provider using MyChart. We now offer e-Visits for anyone 18 and older to request care online for non-urgent symptoms. For details visit [email or web address].   Also download the MyChart app! Go to the app store, search "MyChart", open the app, select [facility], and log in with your MyChart username and password.  Due to Covid, a mask is required upon entering the hospital/clinic. If you do not have a mask, one will be given to you upon arrival. For doctor visits, patients may have 1 support person aged 18 or older with them. For treatment visits, patients cannot have anyone with them due to current Covid guidelines and our immunocompromised population.   

## 2021-07-12 NOTE — Telephone Encounter (Signed)
Call returned to [facility] # [phone] to confirm acceptance of the referral. Message left with call back requested to this CM.  Call placed to [facility] # [phone], message left with call back requested to this CM.  Call placed to patient to inquire if he heard from [facility] and he said no. Explained to him that this CM has contacted 9 home health agencies and none have accepted the referral.  [facility] is the last option. Informed him that this CM will contact him after hearing back from [facility].

## 2021-07-13 NOTE — Telephone Encounter (Signed)
Call placed to [facility] and she confirmed that they have accepted the referral.  Explained to her that this CM spoke to the patient yesterday and he has not heard from anyone at [facility]. Alyse Low said she would follow up and get the patient on the schedule.   Call placed to patient and informed him of above. He said he did not hear from [facility] this morning. Instructed him to call this clinic if he has not heard from [facility] by the beginning of next week.

## 2021-07-14 ENCOUNTER — Encounter: Payer: Self-pay | Admitting: Hematology

## 2021-07-14 NOTE — Progress Notes (Signed)
HEMATOLOGY/ONCOLOGY CLINIC NOTE  Date of Service: .07/07/2021   Patient Care Team: [name], NP as PCP - General (Internal Medicine)  CHIEF COMPLAINTS/PURPOSE OF CONSULTATION:  Bladder Cancer locally advanced muscle invasive  HISTORY OF PRESENTING ILLNESS:   Luis House is a wonderful 70 y.o. male who has been referred to [name] from the [facility] ED for evaluation and continued management of his locally advanced muscle invasive bladder cancer following his recent visit to ED for hematuria.  Patients daughter joins [name] by phone.  Patient has recently moved from [place], [place] where he was receiving his care from Dr Nicki Reaper DO  His oncologic history is as follows based on outside medical records-  Diagnosis: Bladder Cancer   Date of Diagnosis: 2019  Recurrence 06/18/19   Pathology: Initial pT1 disease in 2019 from [place] (no path report available TURBT 06/18/19: A) URINARY BLADDER TUMOR (ANTERIOR WALL), TUR - PAPILLARY UROTHELIAL CARCINOMA, HIGH-GRADE - TUMOR EXTENSIVELY INVADES LAMINA PROPRIA - MUSCULARIS PROPRIA IS PRESENT WITH NO EVIDENCE OF DEFINITE INVASION   B) URINARY BLADDER TUMOR (POSTERIOR WALL), TUR - PAPILLARY UROTHELIAL CARCINOMA, HIGH-GRADE - TUMOR INVADES MUSCULARIS PROPRIA   C) URINARY BLADDER TUMOR (NECK), TUR - PAPILLARY UROTHELIAL CARCINOMA, HIGH-GRADE - TUMOR INVADES LAMINA PROPRIA - MUSCULARIS PROPRIA IS PRESENT WITH NO EVIDENCE OF DEFINITE INVASION    Cancer Stage: Initial pT1, recurrent pT2Nx Cancer Staging No matching staging information was found for the patient.   Sites of Disease: Bladder   Previous Treatment: TURBT 2019 in [place] -TURBT with intravesicular Gemcitabine 07/10/19  -Neoadjuvant Cisplatin + Gemcitabine C1 07/29/19 (stopped after Cycle 1 due to renal injury) -Concurrent chemoradiation with Gemcitabine 70mg /m2 weekly completed 8 weekly cycles from 09/08/19-10/27/19.  -6 doses of BCG with interferon . completed  12/01/2020  Current Treatment: Pembrolizumab 200mg  Q3 weeks: C1 03/09/21    The pt reports intermittent hematuria and lower abdominal pain. Patient daughter notes he cannot follow with [facility] due to unpaid debt from previous visits.  Lab results 04/18/2021 of CBC w/diff and CMP is as follows: all values are WNL except for Hgb of 12.4, Chloride if 119, CO2 of 20, Calcium of 6.1, Total protein of 5.4, Albumin of 2.5, Alkaline Phosphatase of 37, Total bilirubin of 1.6.  On review of systems, pt reports unquantified weight loss and denies chest pain or shortness of breath any other symptoms.  INTERVAL HISTORY  Patient is here for follow-up of his locally advanced urothelial cancer for for follow-up prior to his third cycle of pembrolizumab. He was seen by urology at [facility].  He notes that his planned TURBT procedure was canceled due to some cardiac issues . he continues to have bladder spasms and dysuria which is being managed by urology.  Overall though he notes his symptoms have somewhat improved since his last visit. No fevers no chills no night sweats. No overt adverse effects from his previous cycle of pembrolizumab.   MEDICAL HISTORY:  Past Medical History:  Diagnosis Date   Arthritis    wrist and knee   Bladder tumor    Cataract    right eye   Collar bone fracture 2017   left    SURGICAL HISTORY: Past Surgical History:  Procedure Laterality Date   MULTIPLE TOOTH EXTRACTIONS     TRANSURETHRAL RESECTION OF BLADDER TUMOR N/A 01/09/2019   Procedure: TRANSURETHRAL RESECTION OF BLADDER TUMOR (TURBT)/ POST OPERATIVE INSTILLATION OF CHEMOTHERAPY;  Surgeon: Kathie Rhodes, MD;  Location: [facility];  Service: Urology;  Laterality: N/A;    SOCIAL HISTORY: Social History   Socioeconomic History   Marital status: Divorced    Spouse name: Not on file   Number of children: Not on file   Years of education: Not on file   Highest education level: Not on file   Occupational History   Not on file  Tobacco Use   Smoking status: Every Day    Packs/day: 0.25    Years: 51.00    Pack years: 12.75    Types: Cigarettes   Smokeless tobacco: Never  Vaping Use   Vaping Use: Never used  Substance and Sexual Activity   Alcohol use: Not Currently    Comment: 2 beers per day   Drug use: Never   Sexual activity: Not on file  Other Topics Concern   Not on file  Social History Narrative   Not on file   Social Determinants of Health   Financial Resource Strain: Medium Risk   Difficulty of Paying Living Expenses: Somewhat hard  Food Insecurity: No Food Insecurity   Worried About Charity fundraiser in the Last Year: Never true   Ran Out of Food in the Last Year: Never true  Transportation Needs: Unmet Transportation Needs   Lack of Transportation (Medical): Yes   Lack of Transportation (Non-Medical): Yes  Physical Activity: Not on file  Stress: Stress Concern Present   Feeling of Stress : To some extent  Social Connections: Moderately Isolated   Frequency of Communication with Friends and Family: More than three times a week   Frequency of Social Gatherings with Friends and Family: More than three times a week   Attends Religious Services: More than 4 times per year   Active Member of [employer] or Organizations: No   Attends Archivist Meetings: Never   Marital Status: Divorced  Human resources officer Violence: Not on file    FAMILY HISTORY: No family history on file.  ALLERGIES:  has No Known Allergies.  MEDICATIONS:  Current Outpatient Medications  Medication Sig Dispense Refill   morphine (MSIR) 15 MG tablet Take 1 tablet (15 mg total) by mouth every 4 (four) hours as needed for severe pain. (Patient not taking: No sig reported) 4 tablet 0   pregabalin (LYRICA) 100 MG capsule Take 1 capsule (100 mg total) by mouth 2 (two) times daily. 60 capsule 1   solifenacin (VESICARE) 10 MG tablet Take 1 tablet (10 mg total) by mouth in the  morning. 30 tablet 1   No current facility-administered medications for this visit.   Facility-Administered Medications Ordered in Other Visits  Medication Dose Route Frequency Provider Last Rate Last Admin   gemcitabine (GEMZAR) chemo syringe for bladder instillation 2,000 mg  2,000 mg Bladder Instillation Once Kathie Rhodes, MD        REVIEW OF SYSTEMS:    .10 Point review of Systems was done is negative except as noted above.   PHYSICAL EXAMINATION: ECOG PERFORMANCE STATUS: 2 - Symptomatic, <50% confined to bed VS stable . GENERAL:alert, in no acute distress and comfortable SKIN: no acute rashes, no significant lesions EYES: conjunctiva are pink and non-injected, sclera anicteric OROPHARYNX: MMM, no exudates, no oropharyngeal erythema or ulceration NECK: supple, no JVD LYMPH:  no palpable lymphadenopathy in the cervical, axillary or inguinal regions LUNGS: clear to auscultation b/l with normal respiratory effort HEART: regular rate & rhythm ABDOMEN:  normoactive bowel sounds , non tender, not distended. Extremity: no pedal edema PSYCH: alert & oriented x 3 with fluent  speech NEURO: no focal motor/sensory deficits  LABORATORY DATA:  I have reviewed the data as listed  . CBC Latest Ref Rng & Units 07/07/2021 06/06/2021 05/22/2021  WBC 4.0 - 10.5 K/uL 8.3 9.3 8.4  Hemoglobin 13.0 - 17.0 g/dL 11.8(L) 12.6(L) 12.2(L)  Hematocrit 39.0 - 52.0 % 36.8(L) 39.9 36.8(L)  Platelets 150 - 400 K/uL 256 289 291    . CMP Latest Ref Rng & Units 07/07/2021 06/06/2021 05/22/2021  Glucose 70 - 99 mg/dL 96 90 106(H)  BUN 8 - 23 mg/dL 21 19 20   Creatinine 0.61 - 1.24 mg/dL 1.18 1.74(H) 1.36(H)  Sodium 135 - 145 mmol/L 143 142 140  Potassium 3.5 - 5.1 mmol/L 3.8 3.9 3.9  Chloride 98 - 111 mmol/L 108 107 108  CO2 22 - 32 mmol/L 25 23 21(L)  Calcium 8.9 - 10.3 mg/dL 9.0 9.1 9.0  Total Protein 6.5 - 8.1 g/dL 7.1 8.0 7.6  Total Bilirubin 0.3 - 1.2 mg/dL 0.8 0.8 0.6  Alkaline Phos 38 - 126 U/L  70 68 68  AST 15 - 41 U/L 14(L) 19 18  ALT 0 - 44 U/L 10 10 9     RADIOGRAPHIC STUDIES: I have personally reviewed the radiological images as listed and agreed with the findings in the report.  CT Chest Abdomen Pelvis No IV or Oral Contrast  (03/03/2021)   Anatomical Region Laterality Modality  Chest -- Computed Tomography  Abdomen -- --  Pelvis -- --    Impression  1. Continued bilateral hydronephrosis and ureterectasis  consistent with obstruction from patient's bladder carcinoma. The  wall thickening of the anterior aspect of the bladder appears to  have decreased since previous study. No abdominal or pelvic  lymphadenopathy. Stable small benign-appearing nodules in both  lungs.  No results found.  ASSESSMENT & PLAN:    1. Urothelial bladder cancer, Stage II pT2N0M0  -Initial diagnosis of stage I bladder cancer in 2019 in [place] treated with TURBT, now with recurrent disease in 3 bladder tumors removed by TURBT 1 with muscle invasion on pathology. He was started on neoadjuvant Cisplatin + Gemcitabine and completed Cycle 1 but had significant renal injury that did not fully recover. His level of renal dysfunction will make him not a candidate for further Cisplatin based chemotherapy. He switched to trimodal approach and completed concurrent chemoradiation with 8 cycles of weekly Gemcitabine in 10/2019. Decision against cystectomy with Urology given he is a poor surgical candidate. -He underwent cystoscopy in 08/2020 with no residual tumor noted on exam but bladder washings positive for high grade urothelial carcinoma cells likely representing a carcinoma in situ. His case was discussed at multidisciplinary tumor board with recommendation to consider BCG treatments given he has never had localized bladder cancer treatments. He completed 6 cycle of BCG plus interferon intravesicular late on 12/01/2020. Surveillance scans to [facility] showed no evidence of distant disease but  unfortunately most recent cystoscopy from 01/2021 still with residual high-grade urothelial carcinoma. -His case was discussed again at multidisciplinary tumor board with recommendation for immunotherapy. -Recommended starting on Keytruda 200 mg daily but patient unfortunately no showed to last 2 infusion appointments and has not started on treatment yet. He has a long history of missing appointments previously. Is able to get infusion chair for him today to go ahead and start therapy. I have ordered immunotherapy treatment plan and will continue to monitor for toxicities of therapy. Discussed importance of coming to appointments. Social work has been involved with arranging transport. -We will  continue to repeat CT scans every 3 months reviewed most recent scans with patient today.  2. CKD Stage III -Renal function stable today. 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 Dysuria/Hematuria/bladder spasms prophylactic treatments for UTI in past have not helped. -has been referred to [facility] Urology PLAN -reviewed records from [facility] today reviewed -no prohibitive toxicities from C2 of Pembrolizumab -f/u with Dr Kandice Hams for TURBT as planned -will scheduled next cycle of Pembrolizumab- FOLLOW UP:  Please schedule for 2 cycles of pembrolizumab with labs and MD visits  All of the patients questions were answered with apparent satisfaction. The patient knows to call the clinic with any problems, questions or concerns.  . The total time spent in the appointment was 30 minutes and more than 50% was on counseling and direct patient cares.     Sullivan Lone MD [place] AAHIV[facility] Hematology/Oncology Physician [facility]

## 2021-07-28 ENCOUNTER — Other Ambulatory Visit: Payer: Self-pay

## 2021-07-28 ENCOUNTER — Inpatient Hospital Stay: Payer: Medicare Other | Attending: Hematology

## 2021-07-28 ENCOUNTER — Inpatient Hospital Stay (HOSPITAL_BASED_OUTPATIENT_CLINIC_OR_DEPARTMENT_OTHER): Payer: Medicare Other | Admitting: Hematology

## 2021-07-28 ENCOUNTER — Telehealth: Payer: Self-pay | Admitting: Hematology

## 2021-07-28 ENCOUNTER — Inpatient Hospital Stay: Payer: Medicare Other

## 2021-07-28 VITALS — BP 140/77 | HR 60 | Temp 98.1°F | Resp 16 | Ht 66.0 in | Wt 157.7 lb

## 2021-07-28 DIAGNOSIS — N183 Chronic kidney disease, stage 3 unspecified: Secondary | ICD-10-CM | POA: Diagnosis not present

## 2021-07-28 DIAGNOSIS — F17210 Nicotine dependence, cigarettes, uncomplicated: Secondary | ICD-10-CM | POA: Insufficient documentation

## 2021-07-28 DIAGNOSIS — G893 Neoplasm related pain (acute) (chronic): Secondary | ICD-10-CM

## 2021-07-28 DIAGNOSIS — Z5112 Encounter for antineoplastic immunotherapy: Secondary | ICD-10-CM

## 2021-07-28 DIAGNOSIS — C679 Malignant neoplasm of bladder, unspecified: Secondary | ICD-10-CM | POA: Insufficient documentation

## 2021-07-28 DIAGNOSIS — R300 Dysuria: Secondary | ICD-10-CM | POA: Insufficient documentation

## 2021-07-28 DIAGNOSIS — Z79899 Other long term (current) drug therapy: Secondary | ICD-10-CM | POA: Insufficient documentation

## 2021-07-28 DIAGNOSIS — Z95828 Presence of other vascular implants and grafts: Secondary | ICD-10-CM

## 2021-07-28 DIAGNOSIS — Z7189 Other specified counseling: Secondary | ICD-10-CM

## 2021-07-28 LAB — CBC WITH DIFFERENTIAL (CANCER CENTER ONLY)
Abs Immature Granulocytes: 0.04 10*3/uL (ref 0.00–0.07)
Basophils Absolute: 0.1 10*3/uL (ref 0.0–0.1)
Basophils Relative: 1 %
Eosinophils Absolute: 0.2 10*3/uL (ref 0.0–0.5)
Eosinophils Relative: 2 %
HCT: 37.5 % — ABNORMAL LOW (ref 39.0–52.0)
Hemoglobin: 11.7 g/dL — ABNORMAL LOW (ref 13.0–17.0)
Immature Granulocytes: 1 %
Lymphocytes Relative: 15 %
Lymphs Abs: 1.3 10*3/uL (ref 0.7–4.0)
MCH: 26.7 pg (ref 26.0–34.0)
MCHC: 31.2 g/dL (ref 30.0–36.0)
MCV: 85.6 fL (ref 80.0–100.0)
Monocytes Absolute: 0.7 10*3/uL (ref 0.1–1.0)
Monocytes Relative: 8 %
Neutro Abs: 6.5 10*3/uL (ref 1.7–7.7)
Neutrophils Relative %: 73 %
Platelet Count: 275 10*3/uL (ref 150–400)
RBC: 4.38 MIL/uL (ref 4.22–5.81)
RDW: 15 % (ref 11.5–15.5)
WBC Count: 8.8 10*3/uL (ref 4.0–10.5)
nRBC: 0 % (ref 0.0–0.2)

## 2021-07-28 LAB — CMP (CANCER CENTER ONLY)
ALT: 13 U/L (ref 0–44)
AST: 16 U/L (ref 15–41)
Albumin: 3.6 g/dL (ref 3.5–5.0)
Alkaline Phosphatase: 59 U/L (ref 38–126)
Anion gap: 5 (ref 5–15)
BUN: 17 mg/dL (ref 8–23)
CO2: 27 mmol/L (ref 22–32)
Calcium: 8.7 mg/dL — ABNORMAL LOW (ref 8.9–10.3)
Chloride: 108 mmol/L (ref 98–111)
Creatinine: 1.09 mg/dL (ref 0.61–1.24)
GFR, Estimated: >60 mL/min (ref >60)
Glucose, Bld: 106 mg/dL — ABNORMAL HIGH (ref 70–99)
Potassium: 3.7 mmol/L (ref 3.5–5.1)
Sodium: 140 mmol/L (ref 135–145)
Total Bilirubin: 0.4 mg/dL (ref 0.3–1.2)
Total Protein: 7.2 g/dL (ref 6.5–8.1)

## 2021-07-28 MED ORDER — SODIUM CHLORIDE 0.9% FLUSH
10.0000 mL | Freq: Once | INTRAVENOUS | Status: AC
  Administered 2021-07-28: 10 mL

## 2021-07-28 MED ORDER — HEPARIN SOD (PORK) LOCK FLUSH 100 UNIT/ML IV SOLN
500.0000 [IU] | Freq: Once | INTRAVENOUS | Status: AC | PRN
  Administered 2021-07-28: 500 [IU]

## 2021-07-28 MED ORDER — SODIUM CHLORIDE 0.9 % IV SOLN: Freq: Once | INTRAVENOUS | Status: AC

## 2021-07-28 MED ORDER — SODIUM CHLORIDE 0.9% FLUSH
10.0000 mL | INTRAVENOUS | Status: DC | PRN
Start: 2021-07-28 — End: 2021-07-28
  Administered 2021-07-28: 10 mL

## 2021-07-28 MED ORDER — SODIUM CHLORIDE 0.9 % IV SOLN
200.0000 mg | Freq: Once | INTRAVENOUS | Status: AC
  Administered 2021-07-28: 200 mg via INTRAVENOUS
  Filled 2021-07-28: qty 8

## 2021-07-28 NOTE — Patient Instructions (Signed)
[facility] MEDICAL ONCOLOGY   ?Discharge Instructions: ?Thank you for choosing [facility] to provide your oncology and hematology care.  ? ?If you have a lab appointment with the Cancer Center, please go directly to the Cancer Center and check in at the registration area. ?  ?Wear comfortable clothing and clothing appropriate for easy access to any Portacath or PICC line.  ? ?We strive to give you quality time with your provider. You may need to reschedule your appointment if you arrive late (15 or more minutes).  Arriving late affects you and other patients whose appointments are after yours.  Also, if you miss three or more appointments without notifying the office, you may be dismissed from the clinic at the provider?s discretion.    ?  ?For prescription refill requests, have your pharmacy contact our office and allow 72 hours for refills to be completed.   ? ?Today you received the following chemotherapy and/or immunotherapy agents: pembrolizumab    ?  ?To help prevent nausea and vomiting after your treatment, we encourage you to take your nausea medication as directed. ? ?BELOW ARE SYMPTOMS THAT SHOULD BE REPORTED IMMEDIATELY: ?*FEVER GREATER THAN 100.4 F (38 ?C) OR HIGHER ?*CHILLS OR SWEATING ?*NAUSEA AND VOMITING THAT IS NOT CONTROLLED WITH YOUR NAUSEA MEDICATION ?*UNUSUAL SHORTNESS OF BREATH ?*UNUSUAL BRUISING OR BLEEDING ?*URINARY PROBLEMS (pain or burning when urinating, or frequent urination) ?*BOWEL PROBLEMS (unusual diarrhea, constipation, pain near the anus) ?TENDERNESS IN MOUTH AND THROAT WITH OR WITHOUT PRESENCE OF ULCERS (sore throat, sores in mouth, or a toothache) ?UNUSUAL RASH, SWELLING OR PAIN  ?UNUSUAL VAGINAL DISCHARGE OR ITCHING  ? ?Items with * indicate a potential emergency and should be followed up as soon as possible or go to the Emergency Department if any problems should occur. ? ?Please show the CHEMOTHERAPY ALERT CARD or IMMUNOTHERAPY ALERT CARD at  check-in to the Emergency Department and triage nurse. ? ?Should you have questions after your visit or need to cancel or reschedule your appointment, please contact [facility]COL[facility]  Dept: [phone]  and follow the prompts.  Office hours are 8:00 a.m. to 4:30 p.m. Monday - Friday. Please note that voicemails left after 4:00 p.m. may not be returned until the following business day.  We are closed weekends and major holidays. You have access to a nurse at all times for urgent questions. Please call the main number to the clinic Dept: [phone] and follow the prompts. ? ? ?For any non-urgent questions, you may also contact your provider using MyChart. We now offer e-Visits for anyone 18 and older to request care online for non-urgent symptoms. For details visit [email or web address]. ?  ?Also download the MyChart app! Go to the app store, search "MyChart", open the app, select [facility], and log in with your MyChart username and password. ? ?Due to Covid, a mask is required upon entering the hospital/clinic. If you do not have a mask, one will be given to you upon arrival. For doctor visits, patients may have 1 support person aged 18 or older with them. For treatment visits, patients cannot have anyone with them due to current Covid guidelines and our immunocompromised population.  ? ?

## 2021-07-28 NOTE — Telephone Encounter (Signed)
Sch per 10/21 inbasket,ptawarer

## 2021-08-03 ENCOUNTER — Encounter: Payer: Self-pay | Admitting: Hematology

## 2021-08-03 NOTE — Progress Notes (Signed)
HEMATOLOGY/ONCOLOGY CLINIC NOTE  Date of Service: .07/28/2021   Patient Care Team: [name], NP as PCP - General (Internal Medicine)  CHIEF COMPLAINTS/PURPOSE OF CONSULTATION:  Bladder Cancer locally advanced muscle invasive  HISTORY OF PRESENTING ILLNESS:   Luis House is a wonderful 70 y.o. male who has been referred to [name] from the [facility] ED for evaluation and continued management of his locally advanced muscle invasive bladder cancer following his recent visit to ED for hematuria.  Patients daughter joins [name] by phone.  Patient has recently moved from [place], [place] where he was receiving his care from Dr Nicki Reaper DO  His oncologic history is as follows based on outside medical records-  Diagnosis: Bladder Cancer   Date of Diagnosis: 2019  Recurrence 06/18/19   Pathology: Initial pT1 disease in 2019 from [place] (no path report available TURBT 06/18/19: A) URINARY BLADDER TUMOR (ANTERIOR WALL), TUR - PAPILLARY UROTHELIAL CARCINOMA, HIGH-GRADE - TUMOR EXTENSIVELY INVADES LAMINA PROPRIA - MUSCULARIS PROPRIA IS PRESENT WITH NO EVIDENCE OF DEFINITE INVASION   B) URINARY BLADDER TUMOR (POSTERIOR WALL), TUR - PAPILLARY UROTHELIAL CARCINOMA, HIGH-GRADE - TUMOR INVADES MUSCULARIS PROPRIA   C) URINARY BLADDER TUMOR (NECK), TUR - PAPILLARY UROTHELIAL CARCINOMA, HIGH-GRADE - TUMOR INVADES LAMINA PROPRIA - MUSCULARIS PROPRIA IS PRESENT WITH NO EVIDENCE OF DEFINITE INVASION    Cancer Stage: Initial pT1, recurrent pT2Nx Cancer Staging No matching staging information was found for the patient.   Sites of Disease: Bladder   Previous Treatment: TURBT 2019 in [place] -TURBT with intravesicular Gemcitabine 07/10/19  -Neoadjuvant Cisplatin + Gemcitabine C1 07/29/19 (stopped after Cycle 1 due to renal injury) -Concurrent chemoradiation with Gemcitabine 70mg /m2 weekly completed 8 weekly cycles from 09/08/19-10/27/19.  -6 doses of BCG with interferon . completed  12/01/2020  Current Treatment: Pembrolizumab 200mg  Q3 weeks: C1 03/09/21    The pt reports intermittent hematuria and lower abdominal pain. Patient daughter notes he cannot follow with [facility] due to unpaid debt from previous visits.  Lab results 04/18/2021 of CBC w/diff and CMP is as follows: all values are WNL except for Hgb of 12.4, Chloride if 119, CO2 of 20, Calcium of 6.1, Total protein of 5.4, Albumin of 2.5, Alkaline Phosphatase of 37, Total bilirubin of 1.6.  On review of systems, pt reports unquantified weight loss and denies chest pain or shortness of breath any other symptoms.  INTERVAL HISTORY  Patient is here for follow-up of his locally advanced urothelial cancer for for follow-up prior to his 4th cycle of pembrolizumab. He was seen by urology at [facility].  He notes that he is awaiting his cardiology evaluation prior to being able to proceed for his TURBT. Notes intermittent bladder spasms but has not noted much hematuria.  No fevers no chills no night sweats. Notes that he has been eating fairly well and has gained about 15 pounds in the last month and a half.  No significant leg swelling. . Wt Readings from Last 3 Encounters:  07/28/21 157 lb 11.2 oz (71.5 kg)  06/06/21 142 lb 6.4 oz (64.6 kg)  05/31/21 147 lb 12.8 oz (67 kg)   Patient notes that he is overall feeling better. No rashes no diarrhea nor blood work toxicities from pembrolizumab. We discussed that we shall plan to get a PET CT scan after 5-6 cycles at this time or any other acute new symptoms.  MEDICAL HISTORY:  Past Medical History:  Diagnosis Date   Arthritis    wrist and knee  Bladder tumor    Cataract    right eye   Collar bone fracture 2017   left    SURGICAL HISTORY: Past Surgical History:  Procedure Laterality Date   MULTIPLE TOOTH EXTRACTIONS     TRANSURETHRAL RESECTION OF BLADDER TUMOR N/A 01/09/2019   Procedure: TRANSURETHRAL RESECTION OF BLADDER TUMOR (TURBT)/  POST OPERATIVE INSTILLATION OF CHEMOTHERAPY;  Surgeon: Kathie Rhodes, MD;  Location: [facility];  Service: Urology;  Laterality: N/A;    SOCIAL HISTORY: Social History   Socioeconomic History   Marital status: Divorced    Spouse name: Not on file   Number of children: Not on file   Years of education: Not on file   Highest education level: Not on file  Occupational History   Not on file  Tobacco Use   Smoking status: Every Day    Packs/day: 0.25    Years: 51.00    Pack years: 12.75    Types: Cigarettes   Smokeless tobacco: Never  Vaping Use   Vaping Use: Never used  Substance and Sexual Activity   Alcohol use: Not Currently    Comment: 2 beers per day   Drug use: Never   Sexual activity: Not on file  Other Topics Concern   Not on file  Social History Narrative   Not on file   Social Determinants of Health   Financial Resource Strain: Medium Risk   Difficulty of Paying Living Expenses: Somewhat hard  Food Insecurity: No Food Insecurity   Worried About Charity fundraiser in the Last Year: Never true   Ran Out of Food in the Last Year: Never true  Transportation Needs: Unmet Transportation Needs   Lack of Transportation (Medical): Yes   Lack of Transportation (Non-Medical): Yes  Physical Activity: Not on file  Stress: Stress Concern Present   Feeling of Stress : To some extent  Social Connections: Moderately Isolated   Frequency of Communication with Friends and Family: More than three times a week   Frequency of Social Gatherings with Friends and Family: More than three times a week   Attends Religious Services: More than 4 times per year   Active Member of [employer] or Organizations: No   Attends Archivist Meetings: Never   Marital Status: Divorced  Human resources officer Violence: Not on file    FAMILY HISTORY: No family history on file.  ALLERGIES:  has No Known Allergies.  MEDICATIONS:  Current Outpatient Medications  Medication Sig Dispense Refill    morphine (MSIR) 15 MG tablet Take 1 tablet (15 mg total) by mouth every 4 (four) hours as needed for severe pain. (Patient not taking: No sig reported) 4 tablet 0   pregabalin (LYRICA) 100 MG capsule Take 1 capsule (100 mg total) by mouth 2 (two) times daily. 60 capsule 1   solifenacin (VESICARE) 10 MG tablet Take 1 tablet (10 mg total) by mouth in the morning. 30 tablet 1   No current facility-administered medications for this visit.   Facility-Administered Medications Ordered in Other Visits  Medication Dose Route Frequency Provider Last Rate Last Admin   gemcitabine (GEMZAR) chemo syringe for bladder instillation 2,000 mg  2,000 mg Bladder Instillation Once Kathie Rhodes, MD        REVIEW OF SYSTEMS:   .10 Point review of Systems was done is negative except as noted above.  PHYSICAL EXAMINATION: ECOG PERFORMANCE STATUS: 2 - Symptomatic, <50% confined to bed .BP 140/77 (BP Location: Left Arm, Patient Position: Sitting)   Pulse 60  Temp 98.1 F (36.7 C)   Resp 16   Ht 5\' 6"  (1.676 m)   Wt 157 lb 11.2 oz (71.5 kg)   SpO2 100%   BMI 25.45 kg/m  . GENERAL:alert, in no acute distress and comfortable SKIN: no acute rashes, no significant lesions EYES: conjunctiva are pink and non-injected, sclera anicteric OROPHARYNX: MMM, no exudates, no oropharyngeal erythema or ulceration NECK: supple, no JVD LYMPH:  no palpable lymphadenopathy in the cervical, axillary or inguinal regions LUNGS: clear to auscultation b/l with normal respiratory effort HEART: regular rate & rhythm ABDOMEN:  normoactive bowel sounds , non tender, not distended. Extremity: no pedal edema PSYCH: alert & oriented x 3 with fluent speech NEURO: no focal motor/sensory deficits   LABORATORY DATA:  I have reviewed the data as listed  . CBC Latest Ref Rng & Units 07/28/2021 07/07/2021 06/06/2021  WBC 4.0 - 10.5 K/uL 8.8 8.3 9.3  Hemoglobin 13.0 - 17.0 g/dL 11.7(L) 11.8(L) 12.6(L)  Hematocrit 39.0 - 52.0 % 37.5(L)  36.8(L) 39.9  Platelets 150 - 400 K/uL 275 256 289    . CMP Latest Ref Rng & Units 07/28/2021 07/07/2021 06/06/2021  Glucose 70 - 99 mg/dL 106(H) 96 90  BUN 8 - 23 mg/dL 17 21 19   Creatinine 0.61 - 1.24 mg/dL 1.09 1.18 1.74(H)  Sodium 135 - 145 mmol/L 140 143 142  Potassium 3.5 - 5.1 mmol/L 3.7 3.8 3.9  Chloride 98 - 111 mmol/L 108 108 107  CO2 22 - 32 mmol/L 27 25 23   Calcium 8.9 - 10.3 mg/dL 8.7(L) 9.0 9.1  Total Protein 6.5 - 8.1 g/dL 7.2 7.1 8.0  Total Bilirubin 0.3 - 1.2 mg/dL 0.4 0.8 0.8  Alkaline Phos 38 - 126 U/L 59 70 68  AST 15 - 41 U/L 16 14(L) 19  ALT 0 - 44 U/L 13 10 10     RADIOGRAPHIC STUDIES: I have personally reviewed the radiological images as listed and agreed with the findings in the report.  CT Chest Abdomen Pelvis No IV or Oral Contrast  (03/03/2021)   Anatomical Region Laterality Modality  Chest -- Computed Tomography  Abdomen -- --  Pelvis -- --    Impression  1. Continued bilateral hydronephrosis and ureterectasis  consistent with obstruction from patient's bladder carcinoma. The  wall thickening of the anterior aspect of the bladder appears to  have decreased since previous study. No abdominal or pelvic  lymphadenopathy. Stable small benign-appearing nodules in both  lungs.  No results found.  ASSESSMENT & PLAN:    1. Urothelial bladder cancer, Stage II pT2N0M0  -Initial diagnosis of stage I bladder cancer in 2019 in [place] treated with TURBT, now with recurrent disease in 3 bladder tumors removed by TURBT 1 with muscle invasion on pathology. He was started on neoadjuvant Cisplatin + Gemcitabine and completed Cycle 1 but had significant renal injury that did not fully recover. His level of renal dysfunction will make him not a candidate for further Cisplatin based chemotherapy. He switched to trimodal approach and completed concurrent chemoradiation with 8 cycles of weekly Gemcitabine in 10/2019. Decision against cystectomy with Urology  given he is a poor surgical candidate. -He underwent cystoscopy in 08/2020 with no residual tumor noted on exam but bladder washings positive for high grade urothelial carcinoma cells likely representing a carcinoma in situ. His case was discussed at multidisciplinary tumor board with recommendation to consider BCG treatments given he has never had localized bladder cancer treatments. He completed 6 cycle of BCG plus  interferon intravesicular late on 12/01/2020. Surveillance scans to [facility] showed no evidence of distant disease but unfortunately most recent cystoscopy from 01/2021 still with residual high-grade urothelial carcinoma. -His case was discussed again at multidisciplinary tumor board with recommendation for immunotherapy. -Recommended starting on Keytruda 200 mg daily but patient unfortunately no showed to last 2 infusion appointments and has not started on treatment yet. He has a long history of missing appointments previously. Is able to get infusion chair for him today to go ahead and start therapy. I have ordered immunotherapy treatment plan and will continue to monitor for toxicities of therapy. Discussed importance of coming to appointments. Social work has been involved with arranging transport. -We will continue to repeat CT scans every 3 months reviewed most recent scans with patient today.  2. CKD Stage III -Renal function stable today. 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 Dysuria/Hematuria/bladder spasms prophylactic treatments for UTI in past have not helped. -has been referred to [facility] -labs today 07/28/2021 stable and reviewed with the patient. -no prohibitive toxicities from C4 of Pembrolizumab -f/u with Dr Kandice Hams for TURBT as planned -will scheduled next 2 cycles of Pembrolizumab with labs and MD visit  FOLLOW UP:  Please schedule for 2 cycles of pembrolizumab with labs and MD visits  All of the patients  questions were answered with apparent satisfaction. The patient knows to call the clinic with any problems, questions or concerns.  . The total time spent in the appointment was 30 minutes and more than 50% was on counseling and direct patient cares.   Sullivan Lone MD [place] AAHIVMS [facility] Hematology/Oncology Physician [facility]

## 2021-08-18 ENCOUNTER — Inpatient Hospital Stay: Payer: Medicare Other

## 2021-08-18 ENCOUNTER — Inpatient Hospital Stay (HOSPITAL_BASED_OUTPATIENT_CLINIC_OR_DEPARTMENT_OTHER): Payer: Medicare Other | Admitting: Hematology

## 2021-08-18 ENCOUNTER — Other Ambulatory Visit: Payer: Self-pay

## 2021-08-18 ENCOUNTER — Inpatient Hospital Stay: Payer: Medicare Other | Attending: Hematology

## 2021-08-18 VITALS — BP 165/90

## 2021-08-18 VITALS — BP 172/104 | HR 70 | Temp 97.3°F | Resp 17 | Wt 153.6 lb

## 2021-08-18 DIAGNOSIS — N183 Chronic kidney disease, stage 3 unspecified: Secondary | ICD-10-CM | POA: Diagnosis not present

## 2021-08-18 DIAGNOSIS — Z95828 Presence of other vascular implants and grafts: Secondary | ICD-10-CM

## 2021-08-18 DIAGNOSIS — R300 Dysuria: Secondary | ICD-10-CM

## 2021-08-18 DIAGNOSIS — C675 Malignant neoplasm of bladder neck: Secondary | ICD-10-CM | POA: Diagnosis not present

## 2021-08-18 DIAGNOSIS — R319 Hematuria, unspecified: Secondary | ICD-10-CM | POA: Diagnosis not present

## 2021-08-18 DIAGNOSIS — C679 Malignant neoplasm of bladder, unspecified: Secondary | ICD-10-CM

## 2021-08-18 DIAGNOSIS — Z5112 Encounter for antineoplastic immunotherapy: Secondary | ICD-10-CM | POA: Insufficient documentation

## 2021-08-18 DIAGNOSIS — N3289 Other specified disorders of bladder: Secondary | ICD-10-CM

## 2021-08-18 DIAGNOSIS — C673 Malignant neoplasm of anterior wall of bladder: Secondary | ICD-10-CM | POA: Insufficient documentation

## 2021-08-18 DIAGNOSIS — Z7189 Other specified counseling: Secondary | ICD-10-CM

## 2021-08-18 DIAGNOSIS — F17210 Nicotine dependence, cigarettes, uncomplicated: Secondary | ICD-10-CM

## 2021-08-18 DIAGNOSIS — C674 Malignant neoplasm of posterior wall of bladder: Secondary | ICD-10-CM | POA: Insufficient documentation

## 2021-08-18 LAB — CBC WITH DIFFERENTIAL (CANCER CENTER ONLY)
Abs Immature Granulocytes: 0.04 10*3/uL (ref 0.00–0.07)
Basophils Absolute: 0.1 10*3/uL (ref 0.0–0.1)
Basophils Relative: 1 %
Eosinophils Absolute: 0.1 10*3/uL (ref 0.0–0.5)
Eosinophils Relative: 2 %
HCT: 39.4 % (ref 39.0–52.0)
Hemoglobin: 12.4 g/dL — ABNORMAL LOW (ref 13.0–17.0)
Immature Granulocytes: 0 %
Lymphocytes Relative: 9 %
Lymphs Abs: 0.9 10*3/uL (ref 0.7–4.0)
MCH: 26.7 pg (ref 26.0–34.0)
MCHC: 31.5 g/dL (ref 30.0–36.0)
MCV: 84.7 fL (ref 80.0–100.0)
Monocytes Absolute: 0.7 10*3/uL (ref 0.1–1.0)
Monocytes Relative: 7 %
Neutro Abs: 7.4 10*3/uL (ref 1.7–7.7)
Neutrophils Relative %: 81 %
Platelet Count: 266 10*3/uL (ref 150–400)
RBC: 4.65 MIL/uL (ref 4.22–5.81)
RDW: 14.7 % (ref 11.5–15.5)
WBC Count: 9.2 10*3/uL (ref 4.0–10.5)
nRBC: 0 % (ref 0.0–0.2)

## 2021-08-18 LAB — CMP (CANCER CENTER ONLY)
ALT: 15 U/L (ref 0–44)
AST: 17 U/L (ref 15–41)
Albumin: 3.9 g/dL (ref 3.5–5.0)
Alkaline Phosphatase: 77 U/L (ref 38–126)
Anion gap: 11 (ref 5–15)
BUN: 15 mg/dL (ref 8–23)
CO2: 25 mmol/L (ref 22–32)
Calcium: 8.8 mg/dL — ABNORMAL LOW (ref 8.9–10.3)
Chloride: 107 mmol/L (ref 98–111)
Creatinine: 1.11 mg/dL (ref 0.61–1.24)
GFR, Estimated: >60 mL/min (ref >60)
Glucose, Bld: 89 mg/dL (ref 70–99)
Potassium: 3.8 mmol/L (ref 3.5–5.1)
Sodium: 143 mmol/L (ref 135–145)
Total Bilirubin: 0.9 mg/dL (ref 0.3–1.2)
Total Protein: 7.8 g/dL (ref 6.5–8.1)

## 2021-08-18 MED ORDER — SODIUM CHLORIDE 0.9% FLUSH
10.0000 mL | INTRAVENOUS | Status: DC | PRN
  Administered 2021-08-18: 10 mL

## 2021-08-18 MED ORDER — SODIUM CHLORIDE 0.9 % IV SOLN: Freq: Once | INTRAVENOUS | Status: AC

## 2021-08-18 MED ORDER — HEPARIN SOD (PORK) LOCK FLUSH 100 UNIT/ML IV SOLN
500.0000 [IU] | Freq: Once | INTRAVENOUS | Status: AC | PRN
  Administered 2021-08-18: 500 [IU]

## 2021-08-18 MED ORDER — SODIUM CHLORIDE 0.9 % IV SOLN
200.0000 mg | Freq: Once | INTRAVENOUS | Status: AC
  Administered 2021-08-18: 200 mg via INTRAVENOUS
  Filled 2021-08-18: qty 8

## 2021-08-18 MED ORDER — SODIUM CHLORIDE 0.9% FLUSH
10.0000 mL | Freq: Once | INTRAVENOUS | Status: AC
  Administered 2021-08-18: 10 mL

## 2021-08-18 NOTE — Patient Instructions (Signed)
[facility] MEDICAL ONCOLOGY  Discharge Instructions: ?Thank you for choosing [facility] to provide your oncology and hematology care.  ? ?If you have a lab appointment with the Cancer Center, please go directly to the Cancer Center and check in at the registration area. ?  ?Wear comfortable clothing and clothing appropriate for easy access to any Portacath or PICC line.  ? ?We strive to give you quality time with your provider. You may need to reschedule your appointment if you arrive late (15 or more minutes).  Arriving late affects you and other patients whose appointments are after yours.  Also, if you miss three or more appointments without notifying the office, you may be dismissed from the clinic at the provider?s discretion.    ?  ?For prescription refill requests, have your pharmacy contact our office and allow 72 hours for refills to be completed.   ? ?Today you received the following chemotherapy and/or immunotherapy agents: Keytruda ?  ?To help prevent nausea and vomiting after your treatment, we encourage you to take your nausea medication as directed. ? ?BELOW ARE SYMPTOMS THAT SHOULD BE REPORTED IMMEDIATELY: ?*FEVER GREATER THAN 100.4 F (38 ?C) OR HIGHER ?*CHILLS OR SWEATING ?*NAUSEA AND VOMITING THAT IS NOT CONTROLLED WITH YOUR NAUSEA MEDICATION ?*UNUSUAL SHORTNESS OF BREATH ?*UNUSUAL BRUISING OR BLEEDING ?*URINARY PROBLEMS (pain or burning when urinating, or frequent urination) ?*BOWEL PROBLEMS (unusual diarrhea, constipation, pain near the anus) ?TENDERNESS IN MOUTH AND THROAT WITH OR WITHOUT PRESENCE OF ULCERS (sore throat, sores in mouth, or a toothache) ?UNUSUAL RASH, SWELLING OR PAIN  ?UNUSUAL VAGINAL DISCHARGE OR ITCHING  ? ?Items with * indicate a potential emergency and should be followed up as soon as possible or go to the Emergency Department if any problems should occur. ? ?Please show the CHEMOTHERAPY ALERT CARD or IMMUNOTHERAPY ALERT CARD at check-in to the  Emergency Department and triage nurse. ? ?Should you have questions after your visit or need to cancel or reschedule your appointment, please contact [facility] MEDICAL ONCOL[facility]  Dept: [phone]  and follow the prompts.  Office hours are 8:00 a.m. to 4:30 p.m. Monday - Friday. Please note that voicemails left after 4:00 p.m. may not be returned until the following business day.  We are closed weekends and major holidays. You have access to a nurse at all times for urgent questions. Please call the main number to the clinic Dept: [phone] and follow the prompts. ? ? ?For any non-urgent questions, you may also contact your provider using MyChart. We now offer e-Visits for anyone 18 and older to request care online for non-urgent symptoms. For details visit [email or web address]. ?  ?Also download the MyChart app! Go to the app store, search "MyChart", open the app, select [facility], and log in with your MyChart username and password. ? ?Due to Covid, a mask is required upon entering the hospital/clinic. If you do not have a mask, one will be given to you upon arrival. For doctor visits, patients may have 1 support person aged 18 or older with them. For treatment visits, patients cannot have anyone with them due to current Covid guidelines and our immunocompromised population.  ? ?

## 2021-08-18 NOTE — Progress Notes (Signed)
Contacted Dr Merita Norton office per Dr Irene Limbo to get pt a follow up appointment with their office. Dr Merita Norton office will call Mr Moragne with an appointment.

## 2021-08-21 ENCOUNTER — Telehealth: Payer: Self-pay | Admitting: Hematology

## 2021-08-21 NOTE — Telephone Encounter (Signed)
Scheduled follow-up appointment per 11/11 los. Patient is aware.

## 2021-08-24 ENCOUNTER — Encounter: Payer: Self-pay | Admitting: Hematology

## 2021-08-28 ENCOUNTER — Encounter: Payer: Self-pay | Admitting: Hematology

## 2021-08-28 NOTE — Addendum Note (Signed)
Addended by: Sullivan Lone on: 08/28/2021 01:00 AM   Modules accepted: Orders

## 2021-08-28 NOTE — Progress Notes (Signed)
HEMATOLOGY/ONCOLOGY CLINIC NOTE  Date of Service: .08/18/2021   Patient Care Team: [name], NP as PCP - General (Internal Medicine)  CHIEF COMPLAINTS/PURPOSE OF CONSULTATION:  Bladder Cancer locally advanced muscle invasive  HISTORY OF PRESENTING ILLNESS:   Luis House is a wonderful 70 y.o. male who has been referred to [name] from the [facility] ED for evaluation and continued management of his locally advanced muscle invasive bladder cancer following his recent visit to ED for hematuria.  Patients daughter joins [name] by phone.  Patient has recently moved from [place], [place] where he was receiving his care from Dr Nicki Reaper DO  His oncologic history is as follows based on outside medical records-  Diagnosis: Bladder Cancer   Date of Diagnosis: 2019  Recurrence 06/18/19   Pathology: Initial pT1 disease in 2019 from [place] (no path report available TURBT 06/18/19: A) URINARY BLADDER TUMOR (ANTERIOR WALL), TUR - PAPILLARY UROTHELIAL CARCINOMA, HIGH-GRADE - TUMOR EXTENSIVELY INVADES LAMINA PROPRIA - MUSCULARIS PROPRIA IS PRESENT WITH NO EVIDENCE OF DEFINITE INVASION   B) URINARY BLADDER TUMOR (POSTERIOR WALL), TUR - PAPILLARY UROTHELIAL CARCINOMA, HIGH-GRADE - TUMOR INVADES MUSCULARIS PROPRIA   C) URINARY BLADDER TUMOR (NECK), TUR - PAPILLARY UROTHELIAL CARCINOMA, HIGH-GRADE - TUMOR INVADES LAMINA PROPRIA - MUSCULARIS PROPRIA IS PRESENT WITH NO EVIDENCE OF DEFINITE INVASION    Cancer Stage: Initial pT1, recurrent pT2Nx Cancer Staging No matching staging information was found for the patient.   Sites of Disease: Bladder   Previous Treatment: TURBT 2019 in [place] -TURBT with intravesicular Gemcitabine 07/10/19  -Neoadjuvant Cisplatin + Gemcitabine C1 07/29/19 (stopped after Cycle 1 due to renal injury) -Concurrent chemoradiation with Gemcitabine 70mg /m2 weekly completed 8 weekly cycles from 09/08/19-10/27/19.  -6 doses of BCG with interferon . completed  12/01/2020  Current Treatment: Pembrolizumab 200mg  Q3 weeks: C1 03/09/21    The pt reports intermittent hematuria and lower abdominal pain. Patient daughter notes he cannot follow with [facility] due to unpaid debt from previous visits.  Lab results 04/18/2021 of CBC w/diff and CMP is as follows: all values are WNL except for Hgb of 12.4, Chloride if 119, CO2 of 20, Calcium of 6.1, Total protein of 5.4, Albumin of 2.5, Alkaline Phosphatase of 37, Total bilirubin of 1.6.  On review of systems, pt reports unquantified weight loss and denies chest pain or shortness of breath any other symptoms.  INTERVAL HISTORY  Patient is here for follow-up of his locally advanced urothelial cancer for for follow-up prior to his 5th cycle of pembrolizumab. He was seen by urology at [facility].  He notes that he has completed his cardiology evaluation and was cleared for bladder surgery.  He was recommended to call his urologist Dr. Warrick Parisian at [facility] for follow-up to determine TURBT plan. Notes intermittent bladder spasms but has not noted much hematuria.  No fevers no chills no night sweats. Notes that he has been eating fairly well and weight has stabilized. No rashes no diarrhea nor blood work toxicities from pembrolizumab. We discussed that we shall plan to get a PET CT scan after 5 cycles.  MEDICAL HISTORY:  Past Medical History:  Diagnosis Date   Arthritis    wrist and knee   Bladder tumor    Cataract    right eye   Collar bone fracture 2017   left    SURGICAL HISTORY: Past Surgical History:  Procedure Laterality Date   MULTIPLE TOOTH EXTRACTIONS     TRANSURETHRAL RESECTION OF BLADDER TUMOR  N/A 01/09/2019   Procedure: TRANSURETHRAL RESECTION OF BLADDER TUMOR (TURBT)/ POST OPERATIVE INSTILLATION OF CHEMOTHERAPY;  Surgeon: Kathie Rhodes, MD;  Location: [facility];  Service: Urology;  Laterality: N/A;    SOCIAL HISTORY: Social History   Socioeconomic History   Marital  status: Divorced    Spouse name: Not on file   Number of children: Not on file   Years of education: Not on file   Highest education level: Not on file  Occupational History   Not on file  Tobacco Use   Smoking status: Every Day    Packs/day: 0.25    Years: 51.00    Pack years: 12.75    Types: Cigarettes   Smokeless tobacco: Never  Vaping Use   Vaping Use: Never used  Substance and Sexual Activity   Alcohol use: Not Currently    Comment: 2 beers per day   Drug use: Never   Sexual activity: Not on file  Other Topics Concern   Not on file  Social History Narrative   Not on file   Social Determinants of Health   Financial Resource Strain: Medium Risk   Difficulty of Paying Living Expenses: Somewhat hard  Food Insecurity: No Food Insecurity   Worried About Charity fundraiser in the Last Year: Never true   Ran Out of Food in the Last Year: Never true  Transportation Needs: Unmet Transportation Needs   Lack of Transportation (Medical): Yes   Lack of Transportation (Non-Medical): Yes  Physical Activity: Not on file  Stress: Stress Concern Present   Feeling of Stress : To some extent  Social Connections: Moderately Isolated   Frequency of Communication with Friends and Family: More than three times a week   Frequency of Social Gatherings with Friends and Family: More than three times a week   Attends Religious Services: More than 4 times per year   Active Member of [employer] or Organizations: No   Attends Archivist Meetings: Never   Marital Status: Divorced  Human resources officer Violence: Not on file    FAMILY HISTORY: No family history on file.  ALLERGIES:  has No Known Allergies.  MEDICATIONS:  Current Outpatient Medications  Medication Sig Dispense Refill   morphine (MSIR) 15 MG tablet Take 1 tablet (15 mg total) by mouth every 4 (four) hours as needed for severe pain. (Patient not taking: No sig reported) 4 tablet 0   pregabalin (LYRICA) 100 MG capsule Take 1  capsule (100 mg total) by mouth 2 (two) times daily. 60 capsule 1   solifenacin (VESICARE) 10 MG tablet Take 1 tablet (10 mg total) by mouth in the morning. 30 tablet 1   No current facility-administered medications for this visit.   Facility-Administered Medications Ordered in Other Visits  Medication Dose Route Frequency Provider Last Rate Last Admin   gemcitabine (GEMZAR) chemo syringe for bladder instillation 2,000 mg  2,000 mg Bladder Instillation Once Kathie Rhodes, MD        REVIEW OF SYSTEMS:   .10 Point review of Systems was done is negative except as noted above.   PHYSICAL EXAMINATION: ECOG PERFORMANCE STATUS: 2 - Symptomatic, <50% confined to bed .BP (!) 172/104 (BP Location: Left Arm, Patient Position: Sitting)   Pulse 70   Temp (!) 97.3 F (36.3 C) (Tympanic)   Resp 17   Wt 153 lb 9 oz (69.7 kg)   SpO2 95%   BMI 24.79 kg/m  . GENERAL:alert, in no acute distress and comfortable SKIN: no acute rashes,  no significant lesions EYES: conjunctiva are pink and non-injected, sclera anicteric OROPHARYNX: MMM, no exudates, no oropharyngeal erythema or ulceration NECK: supple, no JVD LYMPH:  no palpable lymphadenopathy in the cervical, axillary or inguinal regions LUNGS: clear to auscultation b/l with normal respiratory effort HEART: regular rate & rhythm ABDOMEN:  normoactive bowel sounds , non tender, not distended. Extremity: no pedal edema PSYCH: alert & oriented x 3 with fluent speech NEURO: no focal motor/sensory deficits   LABORATORY DATA:  I have reviewed the data as listed  . CBC Latest Ref Rng & Units 08/18/2021 07/28/2021 07/07/2021  WBC 4.0 - 10.5 K/uL 9.2 8.8 8.3  Hemoglobin 13.0 - 17.0 g/dL 12.4(L) 11.7(L) 11.8(L)  Hematocrit 39.0 - 52.0 % 39.4 37.5(L) 36.8(L)  Platelets 150 - 400 K/uL 266 275 256    . CMP Latest Ref Rng & Units 08/18/2021 07/28/2021 07/07/2021  Glucose 70 - 99 mg/dL 89 106(H) 96  BUN 8 - 23 mg/dL 15 17 21   Creatinine 0.61 - 1.24  mg/dL 1.11 1.09 1.18  Sodium 135 - 145 mmol/L 143 140 143  Potassium 3.5 - 5.1 mmol/L 3.8 3.7 3.8  Chloride 98 - 111 mmol/L 107 108 108  CO2 22 - 32 mmol/L 25 27 25   Calcium 8.9 - 10.3 mg/dL 8.8(L) 8.7(L) 9.0  Total Protein 6.5 - 8.1 g/dL 7.8 7.2 7.1  Total Bilirubin 0.3 - 1.2 mg/dL 0.9 0.4 0.8  Alkaline Phos 38 - 126 U/L 77 59 70  AST 15 - 41 U/L 17 16 14(L)  ALT 0 - 44 U/L 15 13 10     RADIOGRAPHIC STUDIES: I have personally reviewed the radiological images as listed and agreed with the findings in the report.  CT Chest Abdomen Pelvis No IV or Oral Contrast  (03/03/2021)   Anatomical Region Laterality Modality  Chest -- Computed Tomography  Abdomen -- --  Pelvis -- --    Impression  1. Continued bilateral hydronephrosis and ureterectasis  consistent with obstruction from patient's bladder carcinoma. The  wall thickening of the anterior aspect of the bladder appears to  have decreased since previous study. No abdominal or pelvic  lymphadenopathy. Stable small benign-appearing nodules in both  lungs.  No results found.  ASSESSMENT & PLAN:    1. Urothelial bladder cancer, Stage II pT2N0M0  -Initial diagnosis of stage I bladder cancer in 2019 in [place] treated with TURBT, now with recurrent disease in 3 bladder tumors removed by TURBT 1 with muscle invasion on pathology. He was started on neoadjuvant Cisplatin + Gemcitabine and completed Cycle 1 but had significant renal injury that did not fully recover. His level of renal dysfunction will make him not a candidate for further Cisplatin based chemotherapy. He switched to trimodal approach and completed concurrent chemoradiation with 8 cycles of weekly Gemcitabine in 10/2019. Decision against cystectomy with Urology given he is a poor surgical candidate. -He underwent cystoscopy in 08/2020 with no residual tumor noted on exam but bladder washings positive for high grade urothelial carcinoma cells likely representing a  carcinoma in situ. His case was discussed at multidisciplinary tumor board with recommendation to consider BCG treatments given he has never had localized bladder cancer treatments. He completed 6 cycle of BCG plus interferon intravesicular late on 12/01/2020. Surveillance scans to [facility] showed no evidence of distant disease but unfortunately most recent cystoscopy from 01/2021 still with residual high-grade urothelial carcinoma. -His case was discussed again at multidisciplinary tumor board with recommendation for immunotherapy. -Recommended starting on Keytruda 200 mg  daily but patient unfortunately no showed to last 2 infusion appointments and has not started on treatment yet. He has a long history of missing appointments previously. Is able to get infusion chair for him today to go ahead and start therapy. I have ordered immunotherapy treatment plan and will continue to monitor for toxicities of therapy. Discussed importance of coming to appointments. Social work has been involved with arranging transport. -We will continue to repeat CT scans every 3 months reviewed most recent scans with patient today.  2. CKD Stage III -Renal function stable today. 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 Dysuria/Hematuria/bladder spasms prophylactic treatments for UTI in past have not helped. -has been referred to [facility] -labs today 08/18/2021 stable and reviewed with the patient. -no prohibitive toxicities from C5 of Pembrolizumab -f/u with Dr Kandice Hams for TURBT as planned -will scheduled next 2 cycles of Pembrolizumab with labs and MD visit   FOLLOW UP: F/u with Dr Isabell Jarvis [facility] for consideration of TURBT PET/CT in 2 weeks Please schedule for 2 cycles of pembrolizumab with labs and MD visits  All of the patients questions were answered with apparent satisfaction. The patient knows to call the clinic with any problems, questions or  concerns.  . The total time spent in the appointment was 30 minutes and more than 50% was on counseling and direct patient cares.    Sullivan Lone MD [place] AAHIVMS [facility] Hematology/Oncology Physician [facility]

## 2021-09-04 ENCOUNTER — Other Ambulatory Visit: Payer: Self-pay

## 2021-09-04 ENCOUNTER — Ambulatory Visit (HOSPITAL_COMMUNITY)
Admission: RE | Admit: 2021-09-04 | Discharge: 2021-09-04 | Disposition: A | Discharge status: Home / Self Care | Payer: Medicare Other | Source: Ambulatory Visit | Attending: Hematology | Admitting: Hematology

## 2021-09-04 DIAGNOSIS — C679 Malignant neoplasm of bladder, unspecified: Secondary | ICD-10-CM | POA: Diagnosis present

## 2021-09-04 LAB — GLUCOSE, CAPILLARY: Glucose-Capillary: 110 mg/dL — ABNORMAL HIGH (ref 70–99)

## 2021-09-04 MED ORDER — FLUDEOXYGLUCOSE F - 18 (FDG) INJECTION
7.5000 | Freq: Once | INTRAVENOUS | Status: AC
  Administered 2021-09-04: 10:00:00 7.6 via INTRAVENOUS

## 2021-09-07 ENCOUNTER — Other Ambulatory Visit: Payer: Self-pay | Admitting: *Deleted

## 2021-09-07 ENCOUNTER — Encounter: Payer: Self-pay | Admitting: Hematology

## 2021-09-07 DIAGNOSIS — C679 Malignant neoplasm of bladder, unspecified: Secondary | ICD-10-CM

## 2021-09-08 ENCOUNTER — Inpatient Hospital Stay: Payer: Medicare Other

## 2021-09-08 ENCOUNTER — Inpatient Hospital Stay (HOSPITAL_BASED_OUTPATIENT_CLINIC_OR_DEPARTMENT_OTHER): Payer: Medicare Other | Admitting: Hematology

## 2021-09-08 ENCOUNTER — Inpatient Hospital Stay: Payer: Medicare Other | Attending: Hematology

## 2021-09-08 ENCOUNTER — Other Ambulatory Visit: Payer: Self-pay

## 2021-09-08 VITALS — BP 109/64 | HR 70 | Temp 97.7°F | Resp 18 | Ht 66.0 in | Wt 150.5 lb

## 2021-09-08 DIAGNOSIS — C774 Secondary and unspecified malignant neoplasm of inguinal and lower limb lymph nodes: Secondary | ICD-10-CM | POA: Insufficient documentation

## 2021-09-08 DIAGNOSIS — C679 Malignant neoplasm of bladder, unspecified: Secondary | ICD-10-CM

## 2021-09-08 DIAGNOSIS — F17210 Nicotine dependence, cigarettes, uncomplicated: Secondary | ICD-10-CM | POA: Diagnosis not present

## 2021-09-08 DIAGNOSIS — Z87440 Personal history of urinary (tract) infections: Secondary | ICD-10-CM | POA: Insufficient documentation

## 2021-09-08 DIAGNOSIS — C675 Malignant neoplasm of bladder neck: Secondary | ICD-10-CM | POA: Insufficient documentation

## 2021-09-08 DIAGNOSIS — R300 Dysuria: Secondary | ICD-10-CM | POA: Diagnosis not present

## 2021-09-08 DIAGNOSIS — R918 Other nonspecific abnormal finding of lung field: Secondary | ICD-10-CM | POA: Insufficient documentation

## 2021-09-08 DIAGNOSIS — Z7189 Other specified counseling: Secondary | ICD-10-CM

## 2021-09-08 DIAGNOSIS — Z5112 Encounter for antineoplastic immunotherapy: Secondary | ICD-10-CM | POA: Insufficient documentation

## 2021-09-08 DIAGNOSIS — N183 Chronic kidney disease, stage 3 unspecified: Secondary | ICD-10-CM | POA: Diagnosis not present

## 2021-09-08 DIAGNOSIS — R319 Hematuria, unspecified: Secondary | ICD-10-CM | POA: Diagnosis not present

## 2021-09-08 DIAGNOSIS — Z95828 Presence of other vascular implants and grafts: Secondary | ICD-10-CM

## 2021-09-08 DIAGNOSIS — Z79899 Other long term (current) drug therapy: Secondary | ICD-10-CM | POA: Diagnosis not present

## 2021-09-08 LAB — CBC WITH DIFFERENTIAL (CANCER CENTER ONLY)
Abs Immature Granulocytes: 0.04 10*3/uL (ref 0.00–0.07)
Basophils Absolute: 0.1 10*3/uL (ref 0.0–0.1)
Basophils Relative: 1 %
Eosinophils Absolute: 0.1 10*3/uL (ref 0.0–0.5)
Eosinophils Relative: 2 %
HCT: 39.4 % (ref 39.0–52.0)
Hemoglobin: 12.4 g/dL — ABNORMAL LOW (ref 13.0–17.0)
Immature Granulocytes: 1 %
Lymphocytes Relative: 14 %
Lymphs Abs: 1.1 10*3/uL (ref 0.7–4.0)
MCH: 26.4 pg (ref 26.0–34.0)
MCHC: 31.5 g/dL (ref 30.0–36.0)
MCV: 83.8 fL (ref 80.0–100.0)
Monocytes Absolute: 0.7 10*3/uL (ref 0.1–1.0)
Monocytes Relative: 9 %
Neutro Abs: 5.6 10*3/uL (ref 1.7–7.7)
Neutrophils Relative %: 73 %
Platelet Count: 263 10*3/uL (ref 150–400)
RBC: 4.7 MIL/uL (ref 4.22–5.81)
RDW: 14.6 % (ref 11.5–15.5)
WBC Count: 7.6 10*3/uL (ref 4.0–10.5)
nRBC: 0 % (ref 0.0–0.2)

## 2021-09-08 LAB — CMP (CANCER CENTER ONLY)
ALT: 13 U/L (ref 0–44)
AST: 18 U/L (ref 15–41)
Albumin: 3.7 g/dL (ref 3.5–5.0)
Alkaline Phosphatase: 63 U/L (ref 38–126)
Anion gap: 10 (ref 5–15)
BUN: 17 mg/dL (ref 8–23)
CO2: 24 mmol/L (ref 22–32)
Calcium: 8.3 mg/dL — ABNORMAL LOW (ref 8.9–10.3)
Chloride: 105 mmol/L (ref 98–111)
Creatinine: 1.21 mg/dL (ref 0.61–1.24)
GFR, Estimated: >60 mL/min (ref >60)
Glucose, Bld: 102 mg/dL — ABNORMAL HIGH (ref 70–99)
Potassium: 3.9 mmol/L (ref 3.5–5.1)
Sodium: 139 mmol/L (ref 135–145)
Total Bilirubin: 0.3 mg/dL (ref 0.3–1.2)
Total Protein: 7.5 g/dL (ref 6.5–8.1)

## 2021-09-08 MED ORDER — SODIUM CHLORIDE 0.9% FLUSH
10.0000 mL | Freq: Once | INTRAVENOUS | Status: AC
  Administered 2021-09-08: 10 mL

## 2021-09-08 MED ORDER — SODIUM CHLORIDE 0.9% FLUSH
10.0000 mL | INTRAVENOUS | Status: DC | PRN
  Administered 2021-09-08: 10 mL

## 2021-09-08 MED ORDER — HEPARIN SOD (PORK) LOCK FLUSH 100 UNIT/ML IV SOLN
500.0000 [IU] | Freq: Once | INTRAVENOUS | Status: AC | PRN
  Administered 2021-09-08: 500 [IU]

## 2021-09-08 MED ORDER — SODIUM CHLORIDE 0.9 % IV SOLN
200.0000 mg | Freq: Once | INTRAVENOUS | Status: AC
  Administered 2021-09-08: 200 mg via INTRAVENOUS
  Filled 2021-09-08: qty 8

## 2021-09-08 MED ORDER — SODIUM CHLORIDE 0.9 % IV SOLN
Freq: Once | INTRAVENOUS | Status: AC
Start: 2021-09-08 — End: 2021-09-08

## 2021-09-08 NOTE — Patient Instructions (Signed)
[facility] MEDICAL ONCOLOGY  Discharge Instructions: ?Thank you for choosing [facility] to provide your oncology and hematology care.  ? ?If you have a lab appointment with the Cancer Center, please go directly to the Cancer Center and check in at the registration area. ?  ?Wear comfortable clothing and clothing appropriate for easy access to any Portacath or PICC line.  ? ?We strive to give you quality time with your provider. You may need to reschedule your appointment if you arrive late (15 or more minutes).  Arriving late affects you and other patients whose appointments are after yours.  Also, if you miss three or more appointments without notifying the office, you may be dismissed from the clinic at the provider?s discretion.    ?  ?For prescription refill requests, have your pharmacy contact our office and allow 72 hours for refills to be completed.   ? ?Today you received the following chemotherapy and/or immunotherapy agents: Keytruda ?  ?To help prevent nausea and vomiting after your treatment, we encourage you to take your nausea medication as directed. ? ?BELOW ARE SYMPTOMS THAT SHOULD BE REPORTED IMMEDIATELY: ?*FEVER GREATER THAN 100.4 F (38 ?C) OR HIGHER ?*CHILLS OR SWEATING ?*NAUSEA AND VOMITING THAT IS NOT CONTROLLED WITH YOUR NAUSEA MEDICATION ?*UNUSUAL SHORTNESS OF BREATH ?*UNUSUAL BRUISING OR BLEEDING ?*URINARY PROBLEMS (pain or burning when urinating, or frequent urination) ?*BOWEL PROBLEMS (unusual diarrhea, constipation, pain near the anus) ?TENDERNESS IN MOUTH AND THROAT WITH OR WITHOUT PRESENCE OF ULCERS (sore throat, sores in mouth, or a toothache) ?UNUSUAL RASH, SWELLING OR PAIN  ?UNUSUAL VAGINAL DISCHARGE OR ITCHING  ? ?Items with * indicate a potential emergency and should be followed up as soon as possible or go to the Emergency Department if any problems should occur. ? ?Please show the CHEMOTHERAPY ALERT CARD or IMMUNOTHERAPY ALERT CARD at check-in to the  Emergency Department and triage nurse. ? ?Should you have questions after your visit or need to cancel or reschedule your appointment, please contact [facility]COL[facility]  Dept: [phone]  and follow the prompts.  Office hours are 8:00 a.m. to 4:30 p.m. Monday - Friday. Please note that voicemails left after 4:00 p.m. may not be returned until the following business day.  We are closed weekends and major holidays. You have access to a nurse at all times for urgent questions. Please call the main number to the clinic Dept: [phone] and follow the prompts. ? ? ?For any non-urgent questions, you may also contact your provider using MyChart. We now offer e-Visits for anyone 18 and older to request care online for non-urgent symptoms. For details visit [email or web address]. ?  ?Also download the MyChart app! Go to the app store, search "MyChart", open the app, select [facility], and log in with your MyChart username and password. ? ?Due to Covid, a mask is required upon entering the hospital/clinic. If you do not have a mask, one will be given to you upon arrival. For doctor visits, patients may have 1 support person aged 18 or older with them. For treatment visits, patients cannot have anyone with them due to current Covid guidelines and our immunocompromised population.  ? ?

## 2021-09-11 ENCOUNTER — Telehealth: Payer: Self-pay | Admitting: Hematology

## 2021-09-11 NOTE — Telephone Encounter (Signed)
Scheduled follow-up appointment per 12/2 los. Patient is aware. Mailed calendar.

## 2021-09-14 ENCOUNTER — Encounter: Payer: Self-pay | Admitting: Hematology

## 2021-09-14 NOTE — Progress Notes (Addendum)
HEMATOLOGY/ONCOLOGY CLINIC NOTE  Date of Service: .09/08/2021   Patient Care Team: [name], NP as PCP - General (Internal Medicine)  CHIEF COMPLAINTS/PURPOSE OF CONSULTATION:  Follow-up for continued management of muscle invasive locally advanced bladder cancer   HISTORY OF PRESENTING ILLNESS:   Luis House is a wonderful 70 y.o. male who has been referred to [name] from the [facility] ED for evaluation and continued management of his locally advanced muscle invasive bladder cancer following his recent visit to ED for hematuria.  Patients daughter joins [name] by phone.  Patient has recently moved from [place], [place] where he was receiving his care from Dr Nicki Reaper DO  His oncologic history is as follows based on outside medical records-  Diagnosis: Bladder Cancer   Date of Diagnosis: 2019  Recurrence 06/18/19   Pathology: Initial pT1 disease in 2019 from [facility] (no path report available TURBT 06/18/19: A) URINARY BLADDER TUMOR (ANTERIOR WALL), TUR - PAPILLARY UROTHELIAL CARCINOMA, HIGH-GRADE - TUMOR EXTENSIVELY INVADES LAMINA PROPRIA - MUSCULARIS PROPRIA IS PRESENT WITH NO EVIDENCE OF DEFINITE INVASION   B) URINARY BLADDER TUMOR (POSTERIOR WALL), TUR - PAPILLARY UROTHELIAL CARCINOMA, HIGH-GRADE - TUMOR INVADES MUSCULARIS PROPRIA   C) URINARY BLADDER TUMOR (NECK), TUR - PAPILLARY UROTHELIAL CARCINOMA, HIGH-GRADE - TUMOR INVADES LAMINA PROPRIA - MUSCULARIS PROPRIA IS PRESENT WITH NO EVIDENCE OF DEFINITE INVASION    Cancer Stage: Initial pT1, recurrent pT2Nx Cancer Staging No matching staging information was found for the patient.   Sites of Disease: Bladder   Previous Treatment: TURBT 2019 in [place] -TURBT with intravesicular Gemcitabine 07/10/19  -Neoadjuvant Cisplatin + Gemcitabine C1 07/29/19 (stopped after Cycle 1 due to renal injury) -Concurrent chemoradiation with Gemcitabine 70mg /m2 weekly completed 8 weekly cycles from 09/08/19-10/27/19.  -6  doses of BCG with interferon . completed 12/01/2020  Current Treatment: Pembrolizumab 200mg  Q3 weeks: C1 03/09/21    The pt reports intermittent hematuria and lower abdominal pain. Patient daughter notes he cannot follow with [facility] due to unpaid debt from previous visits.  Lab results 04/18/2021 of CBC w/diff and CMP is as follows: all values are WNL except for Hgb of 12.4, Chloride if 119, CO2 of 20, Calcium of 6.1, Total protein of 5.4, Albumin of 2.5, Alkaline Phosphatase of 37, Total bilirubin of 1.6.  On review of systems, pt reports unquantified weight loss and denies chest pain or shortness of breath any other symptoms.  INTERVAL HISTORY  Patient is here for follow-up of his muscle invasive locally advanced urothelial bladder cancer to review his PET scan and for his next cycle of pembrolizumab immunotherapy. He was last seen in our clinic on 08/18/2021.  He still has not been able to follow-up with urology Dr. Sharman Crate for consideration of his TURBT after cardiology clearance.  My nurse has also called the urologist office several times to try to set him up for another appointment.  Patient notes he has been feeling better with decreased bladder symptoms.  Decreased hematuria.  Improved appetite.  He notes he is not having as much urinary pressure. No other new focal symptoms.  Labs done today 09/08/2021 show CBC with stable hemoglobin of 12.4, WBC count of 7.6k and platelets of 263k CMP stable creatinine 1.21  PET CT scan done on 09/04/2021 showed interval development of left inguinal and external iliac nodal metastases compared to 04/18/2021.  Persistent bladder wall thickening. Reactive appearing thoracic lymph nodes.  Nonspecific small pulmonary nodules. Diffuse thyroid hypermetabolism concerning for thyroiditis.  I discussed with  the patient his results in details.  He is agreeable to get an ultrasound-guided biopsy of his inguinal lymph node to confirm metastases  from bladder cancer versus being reactive. We discussed continuing pembrolizumab at this time since he has tolerated this well and it has kept his disease under control thus far.  Also he is in the best candidate for aggressive alternative chemotherapy at this time.  MEDICAL HISTORY:  Past Medical History:  Diagnosis Date   Arthritis    wrist and knee   Bladder tumor    Cataract    right eye   Collar bone fracture 2017   left    SURGICAL HISTORY: Past Surgical History:  Procedure Laterality Date   MULTIPLE TOOTH EXTRACTIONS     TRANSURETHRAL RESECTION OF BLADDER TUMOR N/A 01/09/2019   Procedure: TRANSURETHRAL RESECTION OF BLADDER TUMOR (TURBT)/ POST OPERATIVE INSTILLATION OF CHEMOTHERAPY;  Surgeon: Kathie Rhodes, MD;  Location: [facility];  Service: Urology;  Laterality: N/A;    SOCIAL HISTORY: Social History   Socioeconomic History   Marital status: Divorced    Spouse name: Not on file   Number of children: Not on file   Years of education: Not on file   Highest education level: Not on file  Occupational History   Not on file  Tobacco Use   Smoking status: Every Day    Packs/day: 0.25    Years: 51.00    Pack years: 12.75    Types: Cigarettes   Smokeless tobacco: Never  Vaping Use   Vaping Use: Never used  Substance and Sexual Activity   Alcohol use: Not Currently    Comment: 2 beers per day   Drug use: Never   Sexual activity: Not on file  Other Topics Concern   Not on file  Social History Narrative   Not on file   Social Determinants of Health   Financial Resource Strain: Medium Risk   Difficulty of Paying Living Expenses: Somewhat hard  Food Insecurity: No Food Insecurity   Worried About Charity fundraiser in the Last Year: Never true   Ran Out of Food in the Last Year: Never true  Transportation Needs: Unmet Transportation Needs   Lack of Transportation (Medical): Yes   Lack of Transportation (Non-Medical): Yes  Physical Activity: Not on file  Stress:  Stress Concern Present   Feeling of Stress : To some extent  Social Connections: Moderately Isolated   Frequency of Communication with Friends and Family: More than three times a week   Frequency of Social Gatherings with Friends and Family: More than three times a week   Attends Religious Services: More than 4 times per year   Active Member of [employer] or Organizations: No   Attends Archivist Meetings: Never   Marital Status: Divorced  Human resources officer Violence: Not on file    FAMILY HISTORY: No family history on file.  ALLERGIES:  has No Known Allergies.  MEDICATIONS:  Current Outpatient Medications  Medication Sig Dispense Refill   morphine (MSIR) 15 MG tablet Take 1 tablet (15 mg total) by mouth every 4 (four) hours as needed for severe pain. (Patient not taking: No sig reported) 4 tablet 0   pregabalin (LYRICA) 100 MG capsule Take 1 capsule (100 mg total) by mouth 2 (two) times daily. 60 capsule 1   solifenacin (VESICARE) 10 MG tablet Take 1 tablet (10 mg total) by mouth in the morning. 30 tablet 1   No current facility-administered medications for this visit.  Facility-Administered Medications Ordered in Other Visits  Medication Dose Route Frequency Provider Last Rate Last Admin   gemcitabine (GEMZAR) chemo syringe for bladder instillation 2,000 mg  2,000 mg Bladder Instillation Once Kathie Rhodes, MD        REVIEW OF SYSTEMS:   .10 Point review of Systems was done is negative except as noted above.    PHYSICAL EXAMINATION: ECOG PERFORMANCE STATUS: 2 - Symptomatic, <50% confined to bed .BP 109/64 (BP Location: Left Arm, Patient Position: Sitting)   Pulse 70   Temp 97.7 F (36.5 C) (Temporal)   Resp 18   Ht 5\' 6"  (1.676 m)   Wt 150 lb 8 oz (68.3 kg)   SpO2 99%   BMI 24.29 kg/m  . Marland Kitchen GENERAL:alert, in no acute distress and comfortable SKIN: no acute rashes, no significant lesions EYES: conjunctiva are pink and non-injected, sclera anicteric OROPHARYNX:  MMM, no exudates, no oropharyngeal erythema or ulceration NECK: supple, no JVD LYMPH:  no palpable lymphadenopathy in the cervical, axillary or inguinal regions LUNGS: clear to auscultation b/l with normal respiratory effort HEART: regular rate & rhythm ABDOMEN:  normoactive bowel sounds , non tender, not distended. Extremity: no pedal edema PSYCH: alert & oriented x 3 with fluent speech NEURO: no focal motor/sensory deficits  LABORATORY DATA:  I have reviewed the data as listed  . CBC Latest Ref Rng & Units 09/08/2021 08/18/2021 07/28/2021  WBC 4.0 - 10.5 K/uL 7.6 9.2 8.8  Hemoglobin 13.0 - 17.0 g/dL 12.4(L) 12.4(L) 11.7(L)  Hematocrit 39.0 - 52.0 % 39.4 39.4 37.5(L)  Platelets 150 - 400 K/uL 263 266 275    . CMP Latest Ref Rng & Units 09/08/2021 08/18/2021 07/28/2021  Glucose 70 - 99 mg/dL 102(H) 89 106(H)  BUN 8 - 23 mg/dL 17 15 17   Creatinine 0.61 - 1.24 mg/dL 1.21 1.11 1.09  Sodium 135 - 145 mmol/L 139 143 140  Potassium 3.5 - 5.1 mmol/L 3.9 3.8 3.7  Chloride 98 - 111 mmol/L 105 107 108  CO2 22 - 32 mmol/L 24 25 27   Calcium 8.9 - 10.3 mg/dL 8.3(L) 8.8(L) 8.7(L)  Total Protein 6.5 - 8.1 g/dL 7.5 7.8 7.2  Total Bilirubin 0.3 - 1.2 mg/dL 0.3 0.9 0.4  Alkaline Phos 38 - 126 U/L 63 77 59  AST 15 - 41 U/L 18 17 16   ALT 0 - 44 U/L 13 15 13     RADIOGRAPHIC STUDIES: I have personally reviewed the radiological images as listed and agreed with the findings in the report.  CT Chest Abdomen Pelvis No IV or Oral Contrast  (03/03/2021)   Anatomical Region Laterality Modality  Chest -- Computed Tomography  Abdomen -- --  Pelvis -- --    Impression  1. Continued bilateral hydronephrosis and ureterectasis  consistent with obstruction from patient's bladder carcinoma. The  wall thickening of the anterior aspect of the bladder appears to  have decreased since previous study. No abdominal or pelvic  lymphadenopathy. Stable small benign-appearing nodules in both  lungs.  NM PET  Image Restag (PS) Skull Base To Thigh  Result Date: 09/04/2021 CLINICAL DATA:  Subsequent treatment strategy for restaging of bladder cancer. EXAM: NUCLEAR MEDICINE PET SKULL BASE TO THIGH TECHNIQUE: 7.6 mCi F-18 FDG was injected intravenously. Full-ring PET imaging was performed from the skull base to thigh after the radiotracer. CT data was obtained and used for attenuation correction and anatomic localization. Fasting blood glucose: 110 mg/dl COMPARISON:  04/18/2021 abdominopelvic CT.  No prior PET. FINDINGS: Mediastinal blood  pool activity: SUV max 2.9 Liver activity: SUV max NA NECK: No cervical nodal hypermetabolism. Diffuse thyroid hypermetabolism including at a S.U.V. max of 3.9. Incidental CT findings: Left carotid atherosclerosis. CHEST: No pulmonary parenchymal hypermetabolism. A right paratracheal partially calcified 1.2 cm node measures a S.U.V. max of 5.7 on 66/4. Left hilar hypermetabolism which is presumably nodal at a S.U.V. max of 6.7 on 69/4. Incidental CT findings: Right Port-A-Cath tip high right atrium. Lad coronary artery calcification. Mild cardiomegaly. 3 mm left upper lobe pulmonary nodule on 37/8. An anterior left lower lobe 3 mm pulmonary nodule on 40/8. ABDOMEN/PELVIS: Bladder not well evaluated secondary to physiologic tracer accumulation. Left external iliac node measures 9 mm and a S.U.V. max of 5.2 on 163/4. Development of hypermetabolic left inguinal adenopathy. Example more medially at 1.7 cm and a S.U.V. max of 5.8 on 170/4. Just inferior and lateral to this, a left inguinal node measures 1.8 cm and a S.U.V. max of 5.4 on 174/4. Nodes in this region measures maximally 6 mm on 04/18/2021. Presumed urinary contamination about the penis. Incidental CT findings: Normal adrenal glands. Normal noncontrast appearance of the pancreas, gallbladder, liver, kidneys. Abdominal aortic atherosclerosis. Grossly similar appearance of bladder wall thickening in the setting of underdistention.  Example 163/4. Right-sided hydroureteronephrosis is improved to resolved. Left-sided hydroureteronephrosis is mild, improved. Mild prostatomegaly. SKELETON: No abnormal marrow activity. Incidental CT findings: Right greater than left hip osteoarthritis. Left shoulder advanced degenerative changes. IMPRESSION: 1. Interval development of left inguinal and external iliac nodal metastasis compared to 04/18/2021. 2. Persistent bladder wall thickening, suboptimally evaluated by PET. 3. Thoracic nodal hypermetabolism which could be reactive or represent an atypical distribution of nodal metastasis. Reactive etiology is slightly favored. 4. Nonspecific small pulmonary nodules. 5. Diffuse thyroid hypermetabolism can be seen with thyroiditis. Consider correlation with thyroid function tests. 6. Incidental findings, including: Coronary artery atherosclerosis. Aortic Atherosclerosis (ICD10-I70.0). Electronically Signed   By: Abigail Miyamoto M.D.   On: 09/04/2021 15:26    ASSESSMENT & PLAN:    1. Urothelial bladder cancer, Stage II pT2N0M0 , muscle invasive locally advanced. -Initial diagnosis of stage I bladder cancer in 2019 in [place] treated with TURBT, now with recurrent disease in 3 bladder tumors removed by TURBT 1 with muscle invasion on pathology. He was started on neoadjuvant Cisplatin + Gemcitabine and completed Cycle 1 but had significant renal injury that did not fully recover. His level of renal dysfunction will make him not a candidate for further Cisplatin based chemotherapy. He switched to trimodal approach and completed concurrent chemoradiation with 8 cycles of weekly Gemcitabine in 10/2019. Decision against cystectomy with Urology given he is a poor surgical candidate. -He underwent cystoscopy in 08/2020 with no residual tumor noted on exam but bladder washings positive for high grade urothelial carcinoma cells likely representing a carcinoma in situ. His case was discussed at multidisciplinary  tumor board with recommendation to consider BCG treatments given he has never had localized bladder cancer treatments. He completed 6 cycle of BCG plus interferon intravesicular late on 12/01/2020. Surveillance scans to [facility] showed no evidence of distant disease but unfortunately most recent cystoscopy from 01/2021 still with residual high-grade urothelial carcinoma. -His case was discussed again at multidisciplinary tumor board with recommendation for immunotherapy. -Recommended starting on Keytruda 200 mg daily but patient unfortunately no showed to last 2 infusion appointments and has not started on treatment yet. He has a long history of missing appointments previously. Is able to get infusion chair for him today  to go ahead and start therapy. I have ordered immunotherapy treatment plan and will continue to monitor for toxicities of therapy. Discussed importance of coming to appointments. Social work has been involved with arranging transport. -We will continue to repeat CT scans every 3 months reviewed most recent scans with patient today.  2.  Now with concern for metastatic urothelial bladder cancer with mets to the inguinal and pelvic lymph node -to be confirmed with biopsy.  2. CKD Stage III -Renal function stable today. 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 Dysuria/Hematuria/bladder spasms -improved prophylactic treatments for UTI in past have not helped. -has been referred to [facility] -labs today 09/08/2021 stable and were discussed with patient in detail. -I discussed his PET CT scan done on 09/04/2021 showed interval development of left inguinal and external iliac nodal metastases compared to 04/18/2021.  Persistent bladder wall thickening. Reactive appearing thoracic lymph nodes.  Nonspecific small pulmonary nodules. Diffuse thyroid hypermetabolism concerning for thyroiditis.  I discussed with the patient his results in  details.  He is agreeable to get an ultrasound-guided biopsy of his inguinal lymph node to confirm metastases from bladder cancer versus being reactive. -We discussed continuing pembrolizumab at this time since he has tolerated this well and it has kept his disease under control thus far.  Also he is in the best candidate for aggressive alternative chemotherapy at this time. -Follow-up with urology to discuss need/timing for TURBT. -Patient has no prohibitive toxicity from his Keytruda at this time.  Appropriate to proceed with next cycle of pembrolizumab today.  4.  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 PLAN -We will repeat thyroid function test to monitor and with next treatment   FOLLOW UP: -Ultrasound-guided core needle biopsy of left inguinal lymph node in 3-4 weeks -Please schedule for next 2 cycles of pembrolizumab with labs  -MD visit in 6 weeks  All of the patients questions were answered with apparent satisfaction. The patient knows to call the clinic with any problems, questions or concerns.  . The total time spent in the appointment was 32 minutes and more than 50% was on counseling and direct patient cares.     Sullivan Lone MD MS AAHIVMS [facility] Hematology/Oncology Physician [place]  -Ultrasound-guided core needle biopsy of left inguinal lymph node in 3-4 weeks -Please schedule for next 2 cycles of pembrolizumab with labs  -MD visit in 6 weeks

## 2021-09-16 NOTE — Addendum Note (Signed)
Addended by: Sullivan Lone on: 09/16/2021 04:54 PM   Modules accepted: Orders

## 2021-09-18 ENCOUNTER — Encounter (HOSPITAL_COMMUNITY): Payer: Self-pay | Admitting: Radiology

## 2021-09-18 NOTE — [facility] Notes (Signed)
Patient Name  Luis, House Legal Sex  Male DOB  29-Jan-1951 SSN  [id number] Address  [place] [address] Phone  [phone] [facility])  [phone] (Mobile) *Preferred*    RE: [name] CORE BIOPSY (LYMPH NODES) Received: Today Sandi Mariscal, MD  Garth Bigness D OK for US guided L inguinal LN Bx.   PET CT - image 174, series 4.   Sedation per pt request.   Cathren Harsh        Previous Messages   ----- Message -----  From: Garth Bigness D  Sent: 09/18/2021  10:08 AM EST  To: [facility] Procedure Requests  Subject: [name] CORE BIOPSY (LYMPH NODES)                   Procedure:  [name] CORE BIOPSY (LYMPH NODES)   Reason:  Malignant neoplasm of urinary bladder, unspecified site, Encounter for antineoplastic immunotherapy, Patient with muscle invasive urothelial bladder cancer with new inguinal lymphadenopathy for biopsy of lymph node to confirm metastases and for molecular studies   History:  CT, NM PET in computer   Provider:  Brunetta Genera   Provider Contact:  [phone]

## 2021-09-23 ENCOUNTER — Other Ambulatory Visit: Payer: Self-pay

## 2021-09-26 ENCOUNTER — Encounter: Payer: Self-pay | Admitting: Hematology

## 2021-09-27 ENCOUNTER — Encounter: Payer: Self-pay | Admitting: Hematology

## 2021-09-28 ENCOUNTER — Encounter: Payer: Self-pay | Admitting: Hematology

## 2021-09-28 NOTE — Progress Notes (Signed)
[facility] Follow up:    Luis Perna, NP [address] [place] [address]   DIAGNOSIS:  Cancer Staging  No matching staging information was found for the patient.  SUMMARY OF ONCOLOGIC HISTORY: Oncology History  Malignant neoplasm of urinary bladder ([place])  05/11/2021 Initial Diagnosis   Malignant neoplasm of urinary bladder ([place])   05/22/2021 -  Chemotherapy   Patient is on Treatment Plan : BLADDER Pembrolizumab q21d       CURRENT THERAPY: Pembrolizumab  INTERVAL HISTORY: Luis House 70 y.o. male returns for evaluation prior to receiving pembrolizumab.  His most recent imaging was a PET scan which was completed on September 04, 2021.  It showed persistent bladder wall thickening with suboptimal evaluation.  An interval development of left inguinal and external iliac nodal metastasis as compared to April 18, 2021.  Thoracic nodal hypermetabolism was also noted which could has been reactive or an atypical nodal metastases.  There was also thyroid hypermetabolism which could be seen with thyroiditis.  Luis House on December 29.  He is also having thyroid tests today including a TSH T3 and T4.  He continues to do well today and notes that his hematuria is improving as compared to last week.   Patient Active Problem List   Diagnosis Date Noted   Port or reservoir infection 05/22/2021   Port-A-Cath in place 05/22/2021   Malignant neoplasm of urinary bladder ([place]) 05/11/2021   Counseling regarding advance care planning and goals of care 05/11/2021   Hypocalcemia 05/03/2021   UTI (urinary tract infection) 01/10/2020   ARF (acute renal failure) ([place]) 01/10/2020   Tobacco abuse 01/10/2020    has No Known Allergies.  MEDICAL HISTORY: Past Medical History:  Diagnosis Date   Arthritis    wrist and knee   Bladder tumor    Cataract    right eye   Collar bone fracture 2017   left    SURGICAL  HISTORY: Past Surgical History:  Procedure Laterality Date   MULTIPLE TOOTH EXTRACTIONS     TRANSURETHRAL RESECTION OF BLADDER TUMOR N/A 01/09/2019   Procedure: TRANSURETHRAL RESECTION OF BLADDER TUMOR (TURBT)/ POST OPERATIVE INSTILLATION OF CHEMOTHERAPY;  Surgeon: Kathie Rhodes, MD;  Location: [facility];  Service: Urology;  Laterality: N/A;    SOCIAL HISTORY: Social History   Socioeconomic History   Marital status: Divorced    Spouse name: Not on file   Number of children: Not on file   Years of education: Not on file   Highest education level: Not on file  Occupational History   Not on file  Tobacco Use   Smoking status: Every Day    Packs/day: 0.25    Years: 51.00    Pack years: 12.75    Types: Cigarettes   Smokeless tobacco: Never  Vaping Use   Vaping Use: Never used  Substance and Sexual Activity   Alcohol use: Not Currently    Comment: 2 beers per day   Drug use: Never   Sexual activity: Not on file  Other Topics Concern   Not on file  Social History Narrative   Not on file   Social Determinants of Health   Financial Resource Strain: Medium Risk   Difficulty of Paying Living Expenses: Somewhat hard  Food Insecurity: No Food Insecurity   Worried About Running Out of Food in the Last Year: Never true   Ran Out of Food in the Last Year: Never  true  Transportation Needs: Unmet Transportation Needs   Lack of Transportation (Medical): Yes   Lack of Transportation (Non-Medical): Yes  Physical Activity: Not on file  Stress: Stress Concern Present   Feeling of Stress : To some extent  Social Connections: Moderately Isolated   Frequency of Communication with Friends and Family: More than three times a week   Frequency of Social Gatherings with Friends and Family: More than three times a week   Attends Religious Services: More than 4 times per year   Active Member of [employer] or Organizations: No   Attends Archivist Meetings: Never   Marital Status: Divorced   Human resources officer Violence: Not on file    FAMILY HISTORY: No family history on file.  Review of Systems  Constitutional:  Negative for appetite change, chills, fatigue, fever and unexpected weight change.  HENT:   Negative for hearing loss, lump/mass and trouble swallowing.   Eyes:  Negative for eye problems and icterus.  Respiratory:  Negative for chest tightness, cough and shortness of breath.   Cardiovascular:  Negative for chest pain, leg swelling and palpitations.  Gastrointestinal:  Negative for abdominal distention, abdominal pain, constipation, diarrhea, nausea and vomiting.  Endocrine: Negative for hot flashes.  Genitourinary:  Negative for difficulty urinating.   Musculoskeletal:  Negative for arthralgias.  Skin:  Negative for itching and rash.  Neurological:  Negative for dizziness, extremity weakness, headaches and numbness.  Hematological:  Negative for adenopathy. Does not bruise/bleed easily.  Psychiatric/Behavioral:  Negative for depression. The patient is not nervous/anxious.      PHYSICAL EXAMINATION  ECOG PERFORMANCE STATUS: 1 - Symptomatic but completely ambulatory  Vitals:   09/29/21 1457  BP: (!) 155/90  Pulse: 75  Resp: 17  Temp: (!) 97.5 F (36.4 C)  SpO2: 99%    Physical Exam Constitutional:      General: He is not in acute distress.    Appearance: Normal appearance. He is not toxic-appearing.  HENT:     Head: Normocephalic and atraumatic.  Eyes:     General: No scleral icterus. Cardiovascular:     Rate and Rhythm: Normal rate and regular rhythm.     Pulses: Normal pulses.     Heart sounds: Normal heart sounds.  Pulmonary:     Effort: Pulmonary effort is normal.     Breath sounds: Normal breath sounds.  Abdominal:     General: Abdomen is flat. Bowel sounds are normal. There is no distension.     Palpations: Abdomen is soft.     Tenderness: There is no abdominal tenderness.  Musculoskeletal:        General: No swelling.     Cervical  back: Neck supple.  Lymphadenopathy:     Cervical: No cervical adenopathy.  Skin:    General: Skin is warm and dry.     Findings: No rash.  Neurological:     General: No focal deficit present.     Mental Status: He is alert.  Psychiatric:        Mood and Affect: Mood normal.        Behavior: Behavior normal.    LABORATORY DATA:  CBC    Component Value Date/Time   WBC 7.4 09/29/2021 1425   RBC 5.01 09/29/2021 1425   HGB 13.4 09/29/2021 1425   HGB 12.4 (L) 09/08/2021 0904   HCT 41.9 09/29/2021 1425   PLT 224 09/29/2021 1425   PLT 263 09/08/2021 0904   MCV 83.6 09/29/2021 1425   MCH  26.7 09/29/2021 1425   MCHC 32.0 09/29/2021 1425   RDW 14.9 09/29/2021 1425   LYMPHSABS 1.1 09/29/2021 1425   MONOABS 0.6 09/29/2021 1425   EOSABS 0.1 09/29/2021 1425   BASOSABS 0.0 09/29/2021 1425    CMP     Component Value Date/Time   NA 139 09/29/2021 1425   K 4.4 09/29/2021 1425   CL 105 09/29/2021 1425   CO2 27 09/29/2021 1425   GLUCOSE 113 (H) 09/29/2021 1425   BUN 16 09/29/2021 1425   CREATININE 1.26 (H) 09/29/2021 1425   CALCIUM 9.0 09/29/2021 1425   PROT 7.9 09/29/2021 1425   ALBUMIN 4.1 09/29/2021 1425   AST 18 09/29/2021 1425   ALT 13 09/29/2021 1425   ALKPHOS 64 09/29/2021 1425   BILITOT 0.7 09/29/2021 1425   GFRNONAA >60 09/29/2021 1425   GFRAA 40 (L) 01/13/2020 0413         ASSESSMENT and THERAPY PLAN:   Malignant neoplasm of urinary bladder ([place]) Luis House is a 70 year old male who is currently receiving pembrolizumab for his bladder cancer.  He is tolerating this well and will proceed with treatment today.  1.  Bladder cancer.  He did undergo PET scan which is somewhat concerning for metastasis. He will undergo biopsy of his lymph House next week and will follow-up with Dr. Rod Can in January to discuss neck steps and treatment options.   2.  Hematuria: This is improved.  H his hemoglobin is stable.  We will continue to monitor.  Luis House will return in 3 House  for labs, follow-up, and his next treatment.   All questions were answered. The patient knows to call the clinic with any problems, questions or concerns. We can certainly see the patient much sooner if necessary.  Total encounter time: 20 minutes in face-to-face visit time, chart review, lab review, care coordination, and documentation of the encounter.  Wilber Bihari, NP 09/29/21 5:25 PM Medical Oncology and Hematology [facility] [place], [place] [address] Tel. [phone]    Fax. [phone]  *Total Encounter Time as defined by the Centers for Medicare and Medicaid Services includes, in addition to the face-to-face time of a patient visit (documented in the note above) non-face-to-face time: obtaining and reviewing outside history, ordering and reviewing medications, tests or procedures, care coordination (communications with other health care professionals or caregivers) and documentation in the medical record.

## 2021-09-29 ENCOUNTER — Inpatient Hospital Stay: Payer: Medicare Other

## 2021-09-29 ENCOUNTER — Encounter: Payer: Self-pay | Admitting: Hematology

## 2021-09-29 ENCOUNTER — Other Ambulatory Visit: Payer: Self-pay

## 2021-09-29 ENCOUNTER — Inpatient Hospital Stay (HOSPITAL_BASED_OUTPATIENT_CLINIC_OR_DEPARTMENT_OTHER): Payer: Medicare Other | Admitting: Adult Health

## 2021-09-29 VITALS — BP 155/90 | HR 75 | Temp 97.5°F | Resp 17 | Wt 147.0 lb

## 2021-09-29 DIAGNOSIS — Z7189 Other specified counseling: Secondary | ICD-10-CM

## 2021-09-29 DIAGNOSIS — C679 Malignant neoplasm of bladder, unspecified: Secondary | ICD-10-CM

## 2021-09-29 DIAGNOSIS — Z95828 Presence of other vascular implants and grafts: Secondary | ICD-10-CM

## 2021-09-29 DIAGNOSIS — Z5112 Encounter for antineoplastic immunotherapy: Secondary | ICD-10-CM

## 2021-09-29 LAB — CBC WITH DIFFERENTIAL/PLATELET
Abs Immature Granulocytes: 0.02 10*3/uL (ref 0.00–0.07)
Basophils Absolute: 0 10*3/uL (ref 0.0–0.1)
Basophils Relative: 1 %
Eosinophils Absolute: 0.1 10*3/uL (ref 0.0–0.5)
Eosinophils Relative: 1 %
HCT: 41.9 % (ref 39.0–52.0)
Hemoglobin: 13.4 g/dL (ref 13.0–17.0)
Immature Granulocytes: 0 %
Lymphocytes Relative: 15 %
Lymphs Abs: 1.1 10*3/uL (ref 0.7–4.0)
MCH: 26.7 pg (ref 26.0–34.0)
MCHC: 32 g/dL (ref 30.0–36.0)
MCV: 83.6 fL (ref 80.0–100.0)
Monocytes Absolute: 0.6 10*3/uL (ref 0.1–1.0)
Monocytes Relative: 8 %
Neutro Abs: 5.6 10*3/uL (ref 1.7–7.7)
Neutrophils Relative %: 75 %
Platelets: 224 10*3/uL (ref 150–400)
RBC: 5.01 MIL/uL (ref 4.22–5.81)
RDW: 14.9 % (ref 11.5–15.5)
WBC: 7.4 10*3/uL (ref 4.0–10.5)
nRBC: 0 % (ref 0.0–0.2)

## 2021-09-29 LAB — CMP (CANCER CENTER ONLY)
ALT: 13 U/L (ref 0–44)
AST: 18 U/L (ref 15–41)
Albumin: 4.1 g/dL (ref 3.5–5.0)
Alkaline Phosphatase: 64 U/L (ref 38–126)
Anion gap: 7 (ref 5–15)
BUN: 16 mg/dL (ref 8–23)
CO2: 27 mmol/L (ref 22–32)
Calcium: 9 mg/dL (ref 8.9–10.3)
Chloride: 105 mmol/L (ref 98–111)
Creatinine: 1.26 mg/dL — ABNORMAL HIGH (ref 0.61–1.24)
GFR, Estimated: >60 mL/min (ref >60)
Glucose, Bld: 113 mg/dL — ABNORMAL HIGH (ref 70–99)
Potassium: 4.4 mmol/L (ref 3.5–5.1)
Sodium: 139 mmol/L (ref 135–145)
Total Bilirubin: 0.7 mg/dL (ref 0.3–1.2)
Total Protein: 7.9 g/dL (ref 6.5–8.1)

## 2021-09-29 MED ORDER — HEPARIN SOD (PORK) LOCK FLUSH 100 UNIT/ML IV SOLN
500.0000 [IU] | Freq: Once | INTRAVENOUS | Status: AC | PRN
  Administered 2021-09-29: 16:00:00 500 [IU]

## 2021-09-29 MED ORDER — SODIUM CHLORIDE 0.9% FLUSH
10.0000 mL | INTRAVENOUS | Status: DC | PRN
  Administered 2021-09-29: 16:00:00 10 mL

## 2021-09-29 MED ORDER — SODIUM CHLORIDE 0.9% FLUSH: 10.0000 mL | Freq: Once | INTRAVENOUS | Status: DC

## 2021-09-29 MED ORDER — VITAMIN D 25 MCG (1000 UNIT) PO TABS: 1000.0000 [IU] | ORAL_TABLET | Freq: Every day | ORAL | 3 refills | Status: DC

## 2021-09-29 MED ORDER — SODIUM CHLORIDE 0.9 % IV SOLN
200.0000 mg | Freq: Once | INTRAVENOUS | Status: AC
  Administered 2021-09-29: 16:00:00 200 mg via INTRAVENOUS
  Filled 2021-09-29: qty 8

## 2021-09-29 MED ORDER — SODIUM CHLORIDE 0.9 % IV SOLN: Freq: Once | INTRAVENOUS | Status: AC

## 2021-09-29 NOTE — Assessment & Plan Note (Addendum)
Luis House is a 70 year old male who is currently receiving pembrolizumab for his bladder cancer.  He is tolerating this well and will proceed with treatment today.  1.  Bladder cancer.  He did undergo PET scan which is somewhat concerning for metastasis. He will undergo biopsy of his lymph nodes next week and will follow-up with Dr. Rod Can in January to discuss neck steps and treatment options.   2.  Hematuria: This is improved.  H his hemoglobin is stable.  We will continue to monitor.  Luis House will return in 3 weeks for labs, follow-up, and his next treatment.

## 2021-09-29 NOTE — Patient Instructions (Signed)
[facility] MEDICAL [facility]COLOGY  Discharge Instructions: ?Thank you for choosing [facility] to provide your oncology and hematology care.  ? ?If you have a lab appointment with the Cancer Center, please go directly to the Cancer Center and check in at the registration area. ?  ?Wear comfortable clothing and clothing appropriate for easy access to any Portacath or PICC line.  ? ?We strive to give you quality time with your provider. You may need to reschedule your appointment if you arrive late (15 or more minutes).  Arriving late affects you and other patients whose appointments are after yours.  Also, if you miss three or more appointments without notifying the office, you may be dismissed from the clinic at the provider?s discretion.    ?  ?For prescription refill requests, have your pharmacy contact our office and allow 72 hours for refills to be completed.   ? ?Today you received the following chemotherapy and/or immunotherapy agents: Keytruda ?  ?To help prevent nausea and vomiting after your treatment, we encourage you to take your nausea medication as directed. ? ?BELOW ARE SYMPTOMS THAT SHOULD BE REPORTED IMMEDIATELY: ?*FEVER GREATER THAN 100.4 F (38 ?C) OR HIGHER ?*CHILLS OR SWEATING ?*NAUSEA AND VOMITING THAT IS NOT CONTROLLED WITH YOUR NAUSEA MEDICATION ?*UNUSUAL SHORTNESS OF BREATH ?*UNUSUAL BRUISING OR BLEEDING ?*URINARY PROBLEMS (pain or burning when urinating, or frequent urination) ?*BOWEL PROBLEMS (unusual diarrhea, constipation, pain near the anus) ?TENDERNESS IN MOUTH AND THROAT WITH OR WITHOUT PRESENCE OF ULCERS (sore throat, sores in mouth, or a toothache) ?UNUSUAL RASH, SWELLING OR PAIN  ?UNUSUAL VAGINAL DISCHARGE OR ITCHING  ? ?Items with * indicate a potential emergency and should be followed up as soon as possible or go to the Emergency Department if any problems should occur. ? ?Please show the CHEMOTHERAPY ALERT CARD or IMMUNOTHERAPY ALERT CARD at check-in to the  Emergency Department and triage nurse. ? ?Should you have questions after your visit or need to cancel or reschedule your appointment, please contact [facility]  Dept: [phone]  and follow the prompts.  Office hours are 8:00 a.m. to 4:30 p.m. Monday - Friday. Please note that voicemails left after 4:00 p.m. may not be returned until the following business day.  We are closed weekends and major holidays. You have access to a nurse at all times for urgent questions. Please call the main number to the clinic Dept: [phone] and follow the prompts. ? ? ?For any non-urgent questions, you may also contact your provider using MyChart. We now offer e-Visits for anyone 18 and older to request care online for non-urgent symptoms. For details visit [email or web address]. ?  ?Also download the MyChart app! Go to the app store, search "MyChart", open the app, select [facility], and log in with your MyChart username and password. ? ?Due to Covid, a mask is required upon entering the hospital/clinic. If you do not have a mask, one will be given to you upon arrival. For doctor visits, patients may have 1 support person aged 18 or older with them. For treatment visits, patients cannot have anyone with them due to current Covid guidelines and our immunocompromised population.  ? ?

## 2021-09-30 LAB — T3, FREE: T3, Free: 2.7 pg/mL (ref 2.0–4.4)

## 2021-10-03 LAB — TSH: TSH: 3.209 u[IU]/mL (ref 0.320–4.118)

## 2021-10-04 ENCOUNTER — Other Ambulatory Visit: Payer: Self-pay | Admitting: Student

## 2021-10-04 ENCOUNTER — Other Ambulatory Visit: Payer: Self-pay | Admitting: Radiology

## 2021-10-05 ENCOUNTER — Ambulatory Visit (HOSPITAL_COMMUNITY): Admission: RE | Admit: 2021-10-05 | Payer: Medicare Other | Source: Ambulatory Visit

## 2021-10-12 ENCOUNTER — Other Ambulatory Visit: Payer: Self-pay | Admitting: Family Medicine

## 2021-10-12 DIAGNOSIS — R634 Abnormal weight loss: Secondary | ICD-10-CM

## 2021-10-18 ENCOUNTER — Other Ambulatory Visit: Payer: Self-pay

## 2021-10-18 DIAGNOSIS — C679 Malignant neoplasm of bladder, unspecified: Secondary | ICD-10-CM

## 2021-10-19 ENCOUNTER — Other Ambulatory Visit: Payer: Self-pay

## 2021-10-19 ENCOUNTER — Inpatient Hospital Stay: Payer: Medicare Other

## 2021-10-19 ENCOUNTER — Inpatient Hospital Stay (HOSPITAL_BASED_OUTPATIENT_CLINIC_OR_DEPARTMENT_OTHER): Payer: Medicare Other | Admitting: Hematology

## 2021-10-19 ENCOUNTER — Inpatient Hospital Stay: Payer: Medicare Other | Attending: Hematology

## 2021-10-19 VITALS — BP 131/88 | HR 77 | Temp 98.1°F | Resp 18 | Wt 145.4 lb

## 2021-10-19 DIAGNOSIS — Z7189 Other specified counseling: Secondary | ICD-10-CM

## 2021-10-19 DIAGNOSIS — Z5112 Encounter for antineoplastic immunotherapy: Secondary | ICD-10-CM | POA: Diagnosis present

## 2021-10-19 DIAGNOSIS — R630 Anorexia: Secondary | ICD-10-CM

## 2021-10-19 DIAGNOSIS — C679 Malignant neoplasm of bladder, unspecified: Secondary | ICD-10-CM

## 2021-10-19 DIAGNOSIS — N3281 Overactive bladder: Secondary | ICD-10-CM

## 2021-10-19 DIAGNOSIS — N183 Chronic kidney disease, stage 3 unspecified: Secondary | ICD-10-CM | POA: Diagnosis not present

## 2021-10-19 DIAGNOSIS — F17210 Nicotine dependence, cigarettes, uncomplicated: Secondary | ICD-10-CM

## 2021-10-19 DIAGNOSIS — Z95828 Presence of other vascular implants and grafts: Secondary | ICD-10-CM

## 2021-10-19 DIAGNOSIS — R319 Hematuria, unspecified: Secondary | ICD-10-CM

## 2021-10-19 LAB — CBC WITH DIFFERENTIAL (CANCER CENTER ONLY)
Abs Immature Granulocytes: 0.02 10*3/uL (ref 0.00–0.07)
Basophils Absolute: 0 10*3/uL (ref 0.0–0.1)
Basophils Relative: 1 %
Eosinophils Absolute: 0.2 10*3/uL (ref 0.0–0.5)
Eosinophils Relative: 2 %
HCT: 40.1 % (ref 39.0–52.0)
Hemoglobin: 13.1 g/dL (ref 13.0–17.0)
Immature Granulocytes: 0 %
Lymphocytes Relative: 12 %
Lymphs Abs: 0.9 10*3/uL (ref 0.7–4.0)
MCH: 26.8 pg (ref 26.0–34.0)
MCHC: 32.7 g/dL (ref 30.0–36.0)
MCV: 82.2 fL (ref 80.0–100.0)
Monocytes Absolute: 0.6 10*3/uL (ref 0.1–1.0)
Monocytes Relative: 7 %
Neutro Abs: 6.3 10*3/uL (ref 1.7–7.7)
Neutrophils Relative %: 78 %
Platelet Count: 310 10*3/uL (ref 150–400)
RBC: 4.88 MIL/uL (ref 4.22–5.81)
RDW: 14.9 % (ref 11.5–15.5)
WBC Count: 8 10*3/uL (ref 4.0–10.5)
nRBC: 0 % (ref 0.0–0.2)

## 2021-10-19 LAB — CMP (CANCER CENTER ONLY)
ALT: 10 U/L (ref 0–44)
AST: 16 U/L (ref 15–41)
Albumin: 3.9 g/dL (ref 3.5–5.0)
Alkaline Phosphatase: 60 U/L (ref 38–126)
Anion gap: 7 (ref 5–15)
BUN: 24 mg/dL — ABNORMAL HIGH (ref 8–23)
CO2: 28 mmol/L (ref 22–32)
Calcium: 9 mg/dL (ref 8.9–10.3)
Chloride: 104 mmol/L (ref 98–111)
Creatinine: 1.14 mg/dL (ref 0.61–1.24)
GFR, Estimated: >60 mL/min (ref >60)
Glucose, Bld: 95 mg/dL (ref 70–99)
Potassium: 4 mmol/L (ref 3.5–5.1)
Sodium: 139 mmol/L (ref 135–145)
Total Bilirubin: 0.5 mg/dL (ref 0.3–1.2)
Total Protein: 7.7 g/dL (ref 6.5–8.1)

## 2021-10-19 MED ORDER — SODIUM CHLORIDE 0.9 % IV SOLN: Freq: Once | INTRAVENOUS | Status: AC

## 2021-10-19 MED ORDER — SODIUM CHLORIDE 0.9% FLUSH
10.0000 mL | Freq: Once | INTRAVENOUS | Status: AC
  Administered 2021-10-19: 10 mL

## 2021-10-19 MED ORDER — SODIUM CHLORIDE 0.9 % IV SOLN
200.0000 mg | Freq: Once | INTRAVENOUS | Status: AC
  Administered 2021-10-19: 200 mg via INTRAVENOUS
  Filled 2021-10-19: qty 8

## 2021-10-19 MED ORDER — HEPARIN SOD (PORK) LOCK FLUSH 100 UNIT/ML IV SOLN
500.0000 [IU] | Freq: Once | INTRAVENOUS | Status: AC | PRN
  Administered 2021-10-19: 500 [IU]

## 2021-10-19 MED ORDER — SODIUM CHLORIDE 0.9% FLUSH
10.0000 mL | INTRAVENOUS | Status: DC | PRN
  Administered 2021-10-19: 10 mL

## 2021-10-19 NOTE — Patient Instructions (Signed)
[facility]COL[facility]  Discharge Instructions: °Thank you for choosing [facility] to provide your oncology and hematology care.  ° °If you have a lab appointment with the Cancer Center, please go directly to the Cancer Center and check in at the registration area. °  °Wear comfortable clothing and clothing appropriate for easy access to any Portacath or PICC line.  ° °We strive to give you quality time with your provider. You may need to reschedule your appointment if you arrive late (15 or more minutes).  Arriving late affects you and other patients whose appointments are after yours.  Also, if you miss three or more appointments without notifying the office, you may be dismissed from the clinic at the provider’s discretion.    °  °For prescription refill requests, have your pharmacy contact our office and allow 72 hours for refills to be completed.   ° °Today you received the following chemotherapy and/or immunotherapy agent: Pembrolizumab (Keytruda) °  °To help prevent nausea and vomiting after your treatment, we encourage you to take your nausea medication as directed. ° °BELOW ARE SYMPTOMS THAT SHOULD BE REPORTED IMMEDIATELY: °*FEVER GREATER THAN 100.4 F (38 °C) OR HIGHER °*CHILLS OR SWEATING °*NAUSEA AND VOMITING THAT IS NOT CONTROLLED WITH YOUR NAUSEA MEDICATION °*UNUSUAL SHORTNESS OF BREATH °*UNUSUAL BRUISING OR BLEEDING °*URINARY PROBLEMS (pain or burning when urinating, or frequent urination) °*BOWEL PROBLEMS (unusual diarrhea, constipation, pain near the anus) °TENDERNESS IN MOUTH AND THROAT WITH OR WITHOUT PRESENCE OF ULCERS (sore throat, sores in mouth, or a toothache) °UNUSUAL RASH, SWELLING OR PAIN  °UNUSUAL VAGINAL DISCHARGE OR ITCHING  ° °Items with * indicate a potential emergency and should be followed up as soon as possible or go to the Emergency Department if any problems should occur. ° °Please show the CHEMOTHERAPY ALERT CARD or IMMUNOTHERAPY ALERT CARD at  check-in to the Emergency Department and triage nurse. ° °Should you have questions after your visit or need to cancel or reschedule your appointment, please contact [facility]  Dept: [phone]  and follow the prompts.  Office hours are 8:00 a.m. to 4:30 p.m. Monday - Friday. Please note that voicemails left after 4:00 p.m. may not be returned until the following business day.  We are closed weekends and major holidays. You have access to a nurse at all times for urgent questions. Please call the main number to the clinic Dept: [phone] and follow the prompts. ° ° °For any non-urgent questions, you may also contact your provider using MyChart. We now offer e-Visits for anyone 18 and older to request care online for non-urgent symptoms. For details visit [email or web address]. °  °Also download the MyChart app! Go to the app store, search "MyChart", open the app, select , and log in with your MyChart username and password. ° °Due to Covid, a mask is required upon entering the hospital/clinic. If you do not have a mask, one will be given to you upon arrival. For doctor visits, patients may have 1 support person aged 18 or older with them. For treatment visits, patients cannot have anyone with them due to current Covid guidelines and our immunocompromised population.  ° °

## 2021-10-24 ENCOUNTER — Other Ambulatory Visit: Payer: Self-pay | Admitting: Radiology

## 2021-10-24 NOTE — H&P (Incomplete)
Chief Complaint: Patient was seen in consultation today for lymph node biopsy   Referring Physician(s): Brunetta Genera  Supervising Physician: Michaelle Birks  Patient Status: [facility] - Out-pt  History of Present Illness: Luis House is a 71 y.o. 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   PET 09/04/21 IMPRESSION: 1. Interval development of left inguinal and external iliac nodal metastasis compared to 04/18/2021. 2. Persistent bladder wall thickening, suboptimally evaluated by PET. 3. Thoracic nodal hypermetabolism which could be reactive or represent an atypical distribution of nodal metastasis. Reactive etiology is slightly favored. 4. Nonspecific small pulmonary nodules. 5. Diffuse thyroid hypermetabolism can be seen with thyroiditis. Consider correlation with thyroid function tests. 6. Incidental findings, including: Coronary artery atherosclerosis. Aortic Atherosclerosis (ICD10-I70.0).  Interventional Radiology has been asked to evaluate this patient for an image-guided lymph node biopsy for further work up. Imaging reviewed and procedure approved by Dr. Pascal Lux.   Past Medical History:  Diagnosis Date   Arthritis    wrist and knee   Bladder tumor    Cataract    right eye   Collar bone fracture 2017   left    Past Surgical History:  Procedure Laterality Date   MULTIPLE TOOTH EXTRACTIONS     TRANSURETHRAL RESECTION OF BLADDER TUMOR N/A 01/09/2019   Procedure: TRANSURETHRAL RESECTION OF BLADDER TUMOR (TURBT)/ POST OPERATIVE INSTILLATION OF CHEMOTHERAPY;  Surgeon: Kathie Rhodes, MD;  Location: [facility];  Service: Urology;  Laterality: N/A;    Allergies: Patient has no known allergies.  Medications: Prior to Admission medications   Medication Sig Start Date End Date Taking? Authorizing Provider  bisacodyl (DULCOLAX) 5 MG EC  tablet Take 5 mg by mouth daily as needed for moderate constipation.   Yes [provider]  HYDROcodone-acetaminophen (NORCO) 10-325 MG tablet Take 1 tablet by mouth every 6 (six) hours as needed for moderate pain. 07/25/21  Yes [provider]  cholecalciferol (VITAMIN D3) 25 MCG (1000 UNIT) tablet Take 1 tablet (1,000 Units total) by mouth daily. 09/29/21   Gardenia Phlegm, NP     No family history on file.  Social History   Socioeconomic History   Marital status: Divorced    Spouse name: Not on file   Number of children: Not on file   Years of education: Not on file   Highest education level: Not on file  Occupational History   Not on file  Tobacco Use   Smoking status: Every Day    Packs/day: 0.25    Years: 51.00    Pack years: 12.75    Types: Cigarettes   Smokeless tobacco: Never  Vaping Use   Vaping Use: Never used  Substance and Sexual Activity   Alcohol use: Not Currently    Comment: 2 beers per day   Drug use: Never   Sexual activity: Not on file  Other Topics Concern   Not on file  Social History Narrative   Not on file   Social Determinants of Health   Financial Resource Strain: Medium Risk   Difficulty of Paying Living Expenses: Somewhat hard  Food Insecurity: No Food Insecurity   Worried About Charity fundraiser in the Last Year: Never true   Ran Out of Food in the Last Year: Never true  Transportation Needs: Unmet Transportation Needs   Lack of Transportation (Medical): Yes   Lack of Transportation (Non-Medical): Yes  Physical  Activity: Not on file  Stress: Stress Concern Present   Feeling of Stress : To some extent  Social Connections: Moderately Isolated   Frequency of Communication with Friends and Family: More than three times a week   Frequency of Social Gatherings with Friends and Family: More than three times a week   Attends Religious Services: More than 4 times per year   Active Member of [employer] or Organizations:  No   Attends Archivist Meetings: Never   Marital Status: Divorced    Review of Systems: A 12 point ROS discussed and pertinent positives are indicated in the HPI above.  All other systems are negative.  Review of Systems  Vital Signs: There were no vitals taken for this visit.  Physical Exam  Imaging: No results found.  Labs:  CBC: Recent Labs    08/18/21 1057 09/08/21 0904 09/29/21 1425 10/19/21 1054  WBC 9.2 7.6 7.4 8.0  HGB 12.4* 12.4* 13.4 13.1  HCT 39.4 39.4 41.9 40.1  PLT 266 263 224 310    COAGS: No results for input(s): INR, APTT in the last 8760 hours.  BMP: Recent Labs    08/18/21 1057 09/08/21 0904 09/29/21 1425 10/19/21 1054  NA 143 139 139 139  K 3.8 3.9 4.4 4.0  CL 107 105 105 104  CO2 25 24 27 28   GLUCOSE 89 102* 113* 95  BUN 15 17 16  24*  CALCIUM 8.8* 8.3* 9.0 9.0  CREATININE 1.11 1.21 1.26* 1.14  GFRNONAA >60 >60 >60 >60    LIVER FUNCTION TESTS: Recent Labs    08/18/21 1057 09/08/21 0904 09/29/21 1425 10/19/21 1054  BILITOT 0.9 0.3 0.7 0.5  AST 17 18 18 16   ALT 15 13 13 10   ALKPHOS 77 63 64 60  PROT 7.8 7.5 7.9 7.7  ALBUMIN 3.9 3.7 4.1 3.9    TUMOR MARKERS: No results for input(s): AFPTM, CEA, CA199, CHROMGRNA in the last 8760 hours.  Assessment and Plan:  History of bladder cancer; lymphadenopathy: Luis House, 71 year old male, presents today to the [facility] department for an image-guided lymph node biopsy with moderate sedation.  Risks and benefits of this procedure were discussed with the patient and/or patient's family including, but not limited to bleeding, infection, damage to adjacent structures or low yield requiring additional tests.  All of the questions were answered and there is agreement to proceed. He has been NPO.   Consent signed and in chart.  Thank you for this interesting consult.  I greatly enjoyed meeting Luis House and look forward to participating  in their care.  A copy of this report was sent to the requesting provider on this date.  Electronically Signed: Soyla Dryer, AGACNP-BC [phone] 10/24/2021, 4:23 PM   I spent a total of  30 Minutes   in face to face in clinical consultation, greater than 50% of which was counseling/coordinating care for lymph node biopsy

## 2021-10-25 ENCOUNTER — Encounter: Payer: Self-pay | Admitting: Hematology

## 2021-10-25 ENCOUNTER — Ambulatory Visit (HOSPITAL_COMMUNITY)
Admission: RE | Admit: 2021-10-25 | Discharge: 2021-10-25 | Disposition: A | Discharge status: Home / Self Care | Payer: Medicare Other | Source: Ambulatory Visit | Attending: Hematology | Admitting: Hematology

## 2021-10-25 NOTE — Progress Notes (Addendum)
HEMATOLOGY/ONCOLOGY CLINIC NOTE  Date of Service: .10/19/2021   Patient Care Team: Luis Perna, NP as PCP - General (Internal Medicine)  CHIEF COMPLAINTS/PURPOSE OF CONSULTATION:  Follow-up for continued evaluation and management of locally advanced muscle invasive bladder cancer  HISTORY OF PRESENTING ILLNESS:   Luis House is a wonderful 71 y.o. male who has been referred to [name] from the [facility] ED for evaluation and continued management of his locally advanced muscle invasive bladder cancer following his recent visit to ED for hematuria.  Patients daughter joins [name] by phone.  Patient has recently moved from [place], [place] where he was receiving his care from Dr Luis Reaper DO  His oncologic history is as follows based on outside medical records-  Diagnosis: Bladder Cancer   Date of Diagnosis: 2019  Recurrence 06/18/19   Pathology: Initial pT1 disease in 2019 from [place] (no path report available TURBT 06/18/19: A) URINARY BLADDER TUMOR (ANTERIOR WALL), TUR - PAPILLARY UROTHELIAL CARCINOMA, HIGH-GRADE - TUMOR EXTENSIVELY INVADES LAMINA PROPRIA - MUSCULARIS PROPRIA IS PRESENT WITH NO EVIDENCE OF DEFINITE INVASION   B) URINARY BLADDER TUMOR (POSTERIOR WALL), TUR - PAPILLARY UROTHELIAL CARCINOMA, HIGH-GRADE - TUMOR INVADES MUSCULARIS PROPRIA   C) URINARY BLADDER TUMOR (NECK), TUR - PAPILLARY UROTHELIAL CARCINOMA, HIGH-GRADE - TUMOR INVADES LAMINA PROPRIA - MUSCULARIS PROPRIA IS PRESENT WITH NO EVIDENCE OF DEFINITE INVASION    Cancer Stage: Initial pT1, recurrent pT2Nx Cancer Staging No matching staging information was found for the patient.   Sites of Disease: Bladder   Previous Treatment: TURBT 2019 in [place] -TURBT with intravesicular Gemcitabine 07/10/19  -Neoadjuvant Cisplatin + Gemcitabine C1 07/29/19 (stopped after Cycle 1 due to renal injury) -Concurrent chemoradiation with Gemcitabine 70mg /m2 weekly completed 8 weekly cycles from  09/08/19-10/27/19.  -6 doses of BCG with interferon . completed 12/01/2020  Current Treatment: Pembrolizumab 200mg  Q3 weeks: C1 03/09/21    The pt reports intermittent hematuria and lower abdominal pain. Patient daughter notes he cannot follow with [facility] due to unpaid debt from previous visits.  Lab results 04/18/2021 of CBC w/diff and CMP is as follows: all values are WNL except for Hgb of 12.4, Chloride if 119, CO2 of 20, Calcium of 6.1, Total protein of 5.4, Albumin of 2.5, Alkaline Phosphatase of 37, Total bilirubin of 1.6.  On review of systems, pt reports unquantified weight loss and denies chest pain or shortness of breath any other symptoms.  INTERVAL HISTORY  Mr [name] is here for continued evaluation and management of his locally advanced urothelial cancer patient is here for follow-up of his locally advanced urothelial cancer and prior to his neck cycle of palliative pembrolizumab . He notes no acute new symptoms.  No significant hematuria.  No significant bladder spasms. No discomfort passing urine. No new primary toxicities from his pembrolizumab treatment. He has not yet been scheduled for his ultrasound-guided core needle biopsy of the PET positive left inguinal and external iliac lymph nodes.   MEDICAL HISTORY:  Past Medical History:  Diagnosis Date   Arthritis    wrist and knee   Bladder tumor    Cataract    right eye   Collar bone fracture 2017   left    SURGICAL HISTORY: Past Surgical History:  Procedure Laterality Date   MULTIPLE TOOTH EXTRACTIONS     TRANSURETHRAL RESECTION OF BLADDER TUMOR N/A 01/09/2019   Procedure: TRANSURETHRAL RESECTION OF BLADDER TUMOR (TURBT)/ POST OPERATIVE INSTILLATION OF CHEMOTHERAPY;  Surgeon: Kathie Rhodes, MD;  Location: [facility];  Service: Urology;  Laterality: N/A;    SOCIAL HISTORY: Social History   Socioeconomic History   Marital status: Divorced    Spouse name: Not on file   Number of children: Not on  file   Years of education: Not on file   Highest education level: Not on file  Occupational History   Not on file  Tobacco Use   Smoking status: Every Day    Packs/day: 0.25    Years: 51.00    Pack years: 12.75    Types: Cigarettes   Smokeless tobacco: Never  Vaping Use   Vaping Use: Never used  Substance and Sexual Activity   Alcohol use: Not Currently    Comment: 2 beers per day   Drug use: Never   Sexual activity: Not on file  Other Topics Concern   Not on file  Social History Narrative   Not on file   Social Determinants of Health   Financial Resource Strain: Medium Risk   Difficulty of Paying Living Expenses: Somewhat hard  Food Insecurity: No Food Insecurity   Worried About Charity fundraiser in the Last Year: Never true   Ran Out of Food in the Last Year: Never true  Transportation Needs: Unmet Transportation Needs   Lack of Transportation (Medical): Yes   Lack of Transportation (Non-Medical): Yes  Physical Activity: Not on file  Stress: Stress Concern Present   Feeling of Stress : To some extent  Social Connections: Moderately Isolated   Frequency of Communication with Friends and Family: More than three times a week   Frequency of Social Gatherings with Friends and Family: More than three times a week   Attends Religious Services: More than 4 times per year   Active Member of [employer] or Organizations: No   Attends Archivist Meetings: Never   Marital Status: Divorced  Human resources officer Violence: Not on file    FAMILY HISTORY: No family history on file.  ALLERGIES:  has No Known Allergies.  MEDICATIONS:  Current Outpatient Medications  Medication Sig Dispense Refill   cholecalciferol (VITAMIN D3) 25 MCG (1000 UNIT) tablet Take 1 tablet (1,000 Units total) by mouth daily. 90 tablet 3   HYDROcodone-acetaminophen (NORCO) 10-325 MG tablet Take 1 tablet by mouth every 6 (six) hours as needed for moderate pain.     bisacodyl (DULCOLAX) 5 MG EC tablet  Take 5 mg by mouth daily as needed for moderate constipation.     No current facility-administered medications for this visit.    REVIEW OF SYSTEMS:   .10 Point review of Systems was done is negative except as noted above.  PHYSICAL EXAMINATION: ECOG PERFORMANCE STATUS: 2 - Symptomatic, <50% confined to bed .BP 131/88    Pulse 77    Temp 98.1 F (36.7 C)    Resp 18    Wt 145 lb 6.4 oz (66 kg)    SpO2 100%    BMI 23.47 kg/m  . GENERAL:alert, in no acute distress and comfortable SKIN: no acute rashes, no significant lesions EYES: conjunctiva are pink and non-injected, sclera anicteric OROPHARYNX: MMM, no exudates, no oropharyngeal erythema or ulceration NECK: supple, no JVD LYMPH:  no palpable lymphadenopathy in the cervical, axillary or inguinal regions LUNGS: clear to auscultation b/l with normal respiratory effort HEART: regular rate & rhythm ABDOMEN:  normoactive bowel sounds , non tender, not distended. Extremity: no pedal edema PSYCH: alert & oriented x 3 with fluent speech NEURO: no focal motor/sensory deficits   LABORATORY DATA:  I have reviewed the data as listed  . CBC Latest Ref Rng & Units 10/19/2021 09/29/2021 09/08/2021  WBC 4.0 - 10.5 K/uL 8.0 7.4 7.6  Hemoglobin 13.0 - 17.0 g/dL 13.1 13.4 12.4(L)  Hematocrit 39.0 - 52.0 % 40.1 41.9 39.4  Platelets 150 - 400 K/uL 310 224 263    . CMP Latest Ref Rng & Units 10/19/2021 09/29/2021 09/08/2021  Glucose 70 - 99 mg/dL 95 113(H) 102(H)  BUN 8 - 23 mg/dL 24(H) 16 17  Creatinine 0.61 - 1.24 mg/dL 1.14 1.26(H) 1.21  Sodium 135 - 145 mmol/L 139 139 139  Potassium 3.5 - 5.1 mmol/L 4.0 4.4 3.9  Chloride 98 - 111 mmol/L 104 105 105  CO2 22 - 32 mmol/L 28 27 24   Calcium 8.9 - 10.3 mg/dL 9.0 9.0 8.3(L)  Total Protein 6.5 - 8.1 g/dL 7.7 7.9 7.5  Total Bilirubin 0.3 - 1.2 mg/dL 0.5 0.7 0.3  Alkaline Phos 38 - 126 U/L 60 64 63  AST 15 - 41 U/L 16 18 18   ALT 0 - 44 U/L 10 13 13     RADIOGRAPHIC STUDIES: I have personally  reviewed the radiological images as listed and agreed with the findings in the report.  CT Chest Abdomen Pelvis No IV or Oral Contrast  (03/03/2021)   Anatomical Region Laterality Modality  Chest -- Computed Tomography  Abdomen -- --  Pelvis -- --    Impression  1. Continued bilateral hydronephrosis and ureterectasis  consistent with obstruction from patient's bladder carcinoma. The  wall thickening of the anterior aspect of the bladder appears to  have decreased since previous study. No abdominal or pelvic  lymphadenopathy. Stable small benign-appearing nodules in both  lungs.  No results found.  ASSESSMENT & PLAN:    1. Urothelial bladder cancer, Stage II pT2N0M0  -Initial diagnosis of stage I bladder cancer in 2019 in [place] treated with TURBT, now with recurrent disease in 3 bladder tumors removed by TURBT 1 with muscle invasion on pathology. He was started on neoadjuvant Cisplatin + Gemcitabine and completed Cycle 1 but had significant renal injury that did not fully recover. His level of renal dysfunction will make him not a candidate for further Cisplatin based chemotherapy. He switched to trimodal approach and completed concurrent chemoradiation with 8 cycles of weekly Gemcitabine in 10/2019. Decision against cystectomy with Urology given he is a poor surgical candidate. -He underwent cystoscopy in 08/2020 with no residual tumor noted on exam but bladder washings positive for high grade urothelial carcinoma cells likely representing a carcinoma in situ. His case was discussed at multidisciplinary tumor board with recommendation to consider BCG treatments given he has never had localized bladder cancer treatments. He completed 6 cycle of BCG plus interferon intravesicular late on 12/01/2020. Surveillance scans to [facility] showed no evidence of distant disease but unfortunately most recent cystoscopy from 01/2021 still with residual high-grade urothelial carcinoma. -His case was  discussed again at multidisciplinary tumor board with recommendation for immunotherapy. -Recommended starting on Keytruda 200 mg daily but patient unfortunately no showed to last 2 infusion appointments and has not started on treatment yet. He has a long history of missing appointments previously.   2. CKD Stage III -Renal function stable today. 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 Dysuria/Hematuria/bladder spasms prophylactic treatments for UTI in past have not helped. -Supposed to follow-up with Dr. Warrick Parisian /Urology at [facility] -labs today 10/19/2021 discussed with the patient and show normal CBC, normal  CMP. -His bladder symptoms are improved with immunotherapy and therefore we are choosing to continue his palliatively. -No new clinical symptoms suggesting disease progression but his last PET CT scan did show some new left inguinal and external iliac lymph nodes.  He also has some thoracic nodal hypermetabolism in the context of upper respiratory infection. -no prohibitive toxicities from C8 of Pembrolizumab -f/u with Dr Kandice Hams for TURBT as planned -not followed up despite reminders.  -will scheduled next 2 cycles of Pembrolizumab with labs and MD visit  -He is scheduled for an ultrasound-guided left inguinal lymph node biopsy on 10/25/2021 to confirm the presence of metastasis in the lymph nodes. -He also has been scheduled for CT abdomen by Dr. Jacelyn Grip on 11/06/2021  4.  Previous low TSH-repeat TSH on 09/29/2021 is within normal limits at 3.209 -Continue to monitor  FOLLOW UP: note: -Follow-up for ultrasound-guided biopsy of left inguinal lymph node as scheduled on 10/25/2021 -CT abd scheduled by Dr. Jacelyn Grip for 11/06/2021  -Please schedule next cycle of pembrolizumab with labs in 3 weeks    All of the patients questions were answered with apparent satisfaction. The patient knows to call the clinic with any problems, questions or  concerns.  . The total time spent in the appointment was 30 minutes reviewing labs, ordering and management of immunotherapy, counseling and coordination of care.   Sullivan Lone MD MS AAHIVMS [facility] Hematology/Oncology Physician [place]  .Marland Kitchen

## 2021-10-31 NOTE — Progress Notes (Signed)
Attempted to contact pt on all available numbers to get CT biopsy rescheduled. Left message.

## 2021-11-06 ENCOUNTER — Inpatient Hospital Stay: Admission: RE | Admit: 2021-11-06 | Payer: Medicare Other | Source: Ambulatory Visit

## 2021-11-06 ENCOUNTER — Telehealth: Payer: Self-pay | Admitting: Hematology

## 2021-11-06 NOTE — Telephone Encounter (Signed)
Scheduled per 01/12 los, patient has been called and voicemail was full. Calender will be mailed.

## 2021-11-08 ENCOUNTER — Other Ambulatory Visit: Payer: Self-pay | Admitting: *Deleted

## 2021-11-08 DIAGNOSIS — C679 Malignant neoplasm of bladder, unspecified: Secondary | ICD-10-CM

## 2021-11-09 ENCOUNTER — Inpatient Hospital Stay: Payer: Medicare Other

## 2021-11-09 ENCOUNTER — Other Ambulatory Visit: Payer: Self-pay

## 2021-11-09 ENCOUNTER — Encounter: Payer: Self-pay | Admitting: Hematology

## 2021-11-09 ENCOUNTER — Inpatient Hospital Stay: Payer: Medicare Other | Attending: Hematology | Admitting: Hematology

## 2021-11-09 VITALS — BP 158/99 | HR 72 | Temp 97.9°F | Resp 18 | Wt 141.6 lb

## 2021-11-09 DIAGNOSIS — F17210 Nicotine dependence, cigarettes, uncomplicated: Secondary | ICD-10-CM

## 2021-11-09 DIAGNOSIS — C679 Malignant neoplasm of bladder, unspecified: Secondary | ICD-10-CM

## 2021-11-09 DIAGNOSIS — N183 Chronic kidney disease, stage 3 unspecified: Secondary | ICD-10-CM | POA: Diagnosis not present

## 2021-11-09 DIAGNOSIS — Z95828 Presence of other vascular implants and grafts: Secondary | ICD-10-CM

## 2021-11-09 DIAGNOSIS — R319 Hematuria, unspecified: Secondary | ICD-10-CM

## 2021-11-09 DIAGNOSIS — C675 Malignant neoplasm of bladder neck: Secondary | ICD-10-CM | POA: Diagnosis not present

## 2021-11-09 DIAGNOSIS — C673 Malignant neoplasm of anterior wall of bladder: Secondary | ICD-10-CM | POA: Diagnosis not present

## 2021-11-09 DIAGNOSIS — N3289 Other specified disorders of bladder: Secondary | ICD-10-CM

## 2021-11-09 DIAGNOSIS — Z7189 Other specified counseling: Secondary | ICD-10-CM

## 2021-11-09 DIAGNOSIS — Z5112 Encounter for antineoplastic immunotherapy: Secondary | ICD-10-CM | POA: Diagnosis present

## 2021-11-09 DIAGNOSIS — R300 Dysuria: Secondary | ICD-10-CM | POA: Diagnosis not present

## 2021-11-09 DIAGNOSIS — C674 Malignant neoplasm of posterior wall of bladder: Secondary | ICD-10-CM | POA: Insufficient documentation

## 2021-11-09 LAB — CBC WITH DIFFERENTIAL (CANCER CENTER ONLY)
Abs Immature Granulocytes: 0.03 10*3/uL (ref 0.00–0.07)
Basophils Absolute: 0.1 10*3/uL (ref 0.0–0.1)
Basophils Relative: 1 %
Eosinophils Absolute: 0.1 10*3/uL (ref 0.0–0.5)
Eosinophils Relative: 1 %
HCT: 39.3 % (ref 39.0–52.0)
Hemoglobin: 12.9 g/dL — ABNORMAL LOW (ref 13.0–17.0)
Immature Granulocytes: 0 %
Lymphocytes Relative: 12 %
Lymphs Abs: 1.2 10*3/uL (ref 0.7–4.0)
MCH: 27 pg (ref 26.0–34.0)
MCHC: 32.8 g/dL (ref 30.0–36.0)
MCV: 82.2 fL (ref 80.0–100.0)
Monocytes Absolute: 0.9 10*3/uL (ref 0.1–1.0)
Monocytes Relative: 8 %
Neutro Abs: 8.3 10*3/uL — ABNORMAL HIGH (ref 1.7–7.7)
Neutrophils Relative %: 78 %
Platelet Count: 252 10*3/uL (ref 150–400)
RBC: 4.78 MIL/uL (ref 4.22–5.81)
RDW: 15.3 % (ref 11.5–15.5)
WBC Count: 10.6 10*3/uL — ABNORMAL HIGH (ref 4.0–10.5)
nRBC: 0 % (ref 0.0–0.2)

## 2021-11-09 LAB — CMP (CANCER CENTER ONLY)
ALT: 8 U/L (ref 0–44)
AST: 16 U/L (ref 15–41)
Albumin: 4.2 g/dL (ref 3.5–5.0)
Alkaline Phosphatase: 64 U/L (ref 38–126)
Anion gap: 6 (ref 5–15)
BUN: 17 mg/dL (ref 8–23)
CO2: 29 mmol/L (ref 22–32)
Calcium: 9.3 mg/dL (ref 8.9–10.3)
Chloride: 106 mmol/L (ref 98–111)
Creatinine: 1.17 mg/dL (ref 0.61–1.24)
GFR, Estimated: >60 mL/min (ref >60)
Glucose, Bld: 103 mg/dL — ABNORMAL HIGH (ref 70–99)
Potassium: 3.7 mmol/L (ref 3.5–5.1)
Sodium: 141 mmol/L (ref 135–145)
Total Bilirubin: 1 mg/dL (ref 0.3–1.2)
Total Protein: 7.8 g/dL (ref 6.5–8.1)

## 2021-11-09 MED ORDER — SODIUM CHLORIDE 0.9% FLUSH
10.0000 mL | INTRAVENOUS | Status: DC | PRN
  Administered 2021-11-09: 10 mL

## 2021-11-09 MED ORDER — SODIUM CHLORIDE 0.9 % IV SOLN
200.0000 mg | Freq: Once | INTRAVENOUS | Status: DC
  Filled 2021-11-09: qty 8

## 2021-11-09 MED ORDER — HYDROCODONE-ACETAMINOPHEN 10-325 MG PO TABS: 1.0000 | ORAL_TABLET | Freq: Four times a day (QID) | ORAL | 0 refills | Status: DC | PRN

## 2021-11-09 MED ORDER — HEPARIN SOD (PORK) LOCK FLUSH 100 UNIT/ML IV SOLN
500.0000 [IU] | Freq: Once | INTRAVENOUS | Status: AC | PRN
  Administered 2021-11-09: 500 [IU]

## 2021-11-09 MED ORDER — SODIUM CHLORIDE 0.9 % IV SOLN: Freq: Once | INTRAVENOUS | Status: AC

## 2021-11-09 MED ORDER — SODIUM CHLORIDE 0.9% FLUSH
10.0000 mL | Freq: Once | INTRAVENOUS | Status: AC
  Administered 2021-11-09: 10 mL

## 2021-11-09 MED ORDER — SODIUM CHLORIDE 0.9 % IV SOLN
200.0000 mg | Freq: Once | INTRAVENOUS | Status: AC
  Administered 2021-11-09: 200 mg via INTRAVENOUS
  Filled 2021-11-09: qty 8

## 2021-11-09 NOTE — Patient Instructions (Signed)
[facility] MEDICAL ONCOLOGY  Discharge Instructions: ?Thank you for choosing [facility] to provide your oncology and hematology care.  ? ?If you have a lab appointment with the Cancer Center, please go directly to the Cancer Center and check in at the registration area. ?  ?Wear comfortable clothing and clothing appropriate for easy access to any Portacath or PICC line.  ? ?We strive to give you quality time with your provider. You may need to reschedule your appointment if you arrive late (15 or more minutes).  Arriving late affects you and other patients whose appointments are after yours.  Also, if you miss three or more appointments without notifying the office, you may be dismissed from the clinic at the provider?s discretion.    ?  ?For prescription refill requests, have your pharmacy contact our office and allow 72 hours for refills to be completed.   ? ?Today you received the following chemotherapy and/or immunotherapy agents: Keytruda ?  ?To help prevent nausea and vomiting after your treatment, we encourage you to take your nausea medication as directed. ? ?BELOW ARE SYMPTOMS THAT SHOULD BE REPORTED IMMEDIATELY: ?*FEVER GREATER THAN 100.4 F (38 ?C) OR HIGHER ?*CHILLS OR SWEATING ?*NAUSEA AND VOMITING THAT IS NOT CONTROLLED WITH YOUR NAUSEA MEDICATION ?*UNUSUAL SHORTNESS OF BREATH ?*UNUSUAL BRUISING OR BLEEDING ?*URINARY PROBLEMS (pain or burning when urinating, or frequent urination) ?*BOWEL PROBLEMS (unusual diarrhea, constipation, pain near the anus) ?TENDERNESS IN MOUTH AND THROAT WITH OR WITHOUT PRESENCE OF ULCERS (sore throat, sores in mouth, or a toothache) ?UNUSUAL RASH, SWELLING OR PAIN  ?UNUSUAL VAGINAL DISCHARGE OR ITCHING  ? ?Items with * indicate a potential emergency and should be followed up as soon as possible or go to the Emergency Department if any problems should occur. ? ?Please show the CHEMOTHERAPY ALERT CARD or IMMUNOTHERAPY ALERT CARD at check-in to the  Emergency Department and triage nurse. ? ?Should you have questions after your visit or need to cancel or reschedule your appointment, please contact [facility] MEDICAL ONCOLOG[facility]  Dept: [phone]  and follow the prompts.  Office hours are 8:00 a.m. to 4:30 p.m. Monday - Friday. Please note that voicemails left after 4:00 p.m. may not be returned until the following business day.  We are closed weekends and major holidays. You have access to a nurse at all times for urgent questions. Please call the main number to the clinic Dept: [phone] and follow the prompts. ? ? ?For any non-urgent questions, you may also contact your provider using MyChart. We now offer e-Visits for anyone 18 and older to request care online for non-urgent symptoms. For details visit [email or web address]. ?  ?Also download the MyChart app! Go to the app store, search "MyChart", open the app, select , and log in with your MyChart username and password. ? ?Due to Covid, a mask is required upon entering the hospital/clinic. If you do not have a mask, one will be given to you upon arrival. For doctor visits, patients may have 1 support person aged 18 or older with them. For treatment visits, patients cannot have anyone with them due to current Covid guidelines and our immunocompromised population.  ? ?

## 2021-11-13 ENCOUNTER — Other Ambulatory Visit: Payer: Self-pay | Admitting: Radiology

## 2021-11-14 ENCOUNTER — Ambulatory Visit (HOSPITAL_COMMUNITY): Admission: RE | Admit: 2021-11-14 | Payer: Medicare Other | Source: Ambulatory Visit

## 2021-11-15 ENCOUNTER — Encounter: Payer: Self-pay | Admitting: Hematology

## 2021-11-15 NOTE — Progress Notes (Addendum)
HEMATOLOGY/ONCOLOGY CLINIC NOTE  Date of Service: .11/09/2021   Patient Care Team: [name], NP as PCP - General (Internal Medicine)  CHIEF COMPLAINTS/PURPOSE OF CONSULTATION:  Consult for evaluation and management of muscle invasive bladder cancer  HISTORY OF PRESENTING ILLNESS:   Luis House is a wonderful 71 y.o. male who has been referred to [name] from the [facility] ED for evaluation and continued management of his locally advanced muscle invasive bladder cancer following his recent visit to ED for hematuria.  Patients daughter joins [name] by phone.  Patient has recently moved from [place], [place] where he was receiving his care from Dr Nicki Reaper DO  His oncologic history is as follows based on outside medical records-  Diagnosis: Bladder Cancer   Date of Diagnosis: 2019  Recurrence 06/18/19   Pathology: Initial pT1 disease in 2019 from [facility] (no path report available TURBT 06/18/19: A) URINARY BLADDER TUMOR (ANTERIOR WALL), TUR - PAPILLARY UROTHELIAL CARCINOMA, HIGH-GRADE - TUMOR EXTENSIVELY INVADES LAMINA PROPRIA - MUSCULARIS PROPRIA IS PRESENT WITH NO EVIDENCE OF DEFINITE INVASION   B) URINARY BLADDER TUMOR (POSTERIOR WALL), TUR - PAPILLARY UROTHELIAL CARCINOMA, HIGH-GRADE - TUMOR INVADES MUSCULARIS PROPRIA   C) URINARY BLADDER TUMOR (NECK), TUR - PAPILLARY UROTHELIAL CARCINOMA, HIGH-GRADE - TUMOR INVADES LAMINA PROPRIA - MUSCULARIS PROPRIA IS PRESENT WITH NO EVIDENCE OF DEFINITE INVASION    Cancer Stage: Initial pT1, recurrent pT2Nx Cancer Staging No matching staging information was found for the patient.   Sites of Disease: Bladder   Previous Treatment: TURBT 2019 in [place] -TURBT with intravesicular Gemcitabine 07/10/19  -Neoadjuvant Cisplatin + Gemcitabine C1 07/29/19 (stopped after Cycle 1 due to renal injury) -Concurrent chemoradiation with Gemcitabine 70mg /m2 weekly completed 8 weekly cycles from 09/08/19-10/27/19.  -6 doses of BCG with  interferon . completed 12/01/2020  Current Treatment: Pembrolizumab 200mg  Q3 weeks: C1 03/09/21    The pt reports intermittent hematuria and lower abdominal pain. Patient daughter notes he cannot follow with [facility] due to unpaid debt from previous visits.  Lab results 04/18/2021 of CBC w/diff and CMP is as follows: all values are WNL except for Hgb of 12.4, Chloride if 119, CO2 of 20, Calcium of 6.1, Total protein of 5.4, Albumin of 2.5, Alkaline Phosphatase of 37, Total bilirubin of 1.6.  On review of systems, pt reports unquantified weight loss and denies chest pain or shortness of breath any other symptoms.  INTERVAL HISTORY  Luis House is here for continued evaluation and management of his muscle invasive transitional cell carcinoma. His not been able to continue follow-up with his urologist Dr. Sharman Crate at [facility] to determine if he is still a candidate for TURBT.  He is having decreased bladder symptoms with no overt hematuria and left bladder cramping and discomfort. He also did not follow-up for his scheduled ultrasound-guided biopsy of his lymph node. No acute new focal symptoms. No notable toxicities from his pembrolizumab at this time. Labs done today reviewed with the patient.  MEDICAL HISTORY:  Past Medical History:  Diagnosis Date   Arthritis    wrist and knee   Bladder tumor    Cataract    right eye   Collar bone fracture 2017   left    SURGICAL HISTORY: Past Surgical History:  Procedure Laterality Date   MULTIPLE TOOTH EXTRACTIONS     TRANSURETHRAL RESECTION OF BLADDER TUMOR N/A 01/09/2019   Procedure: TRANSURETHRAL RESECTION OF BLADDER TUMOR (TURBT)/ POST OPERATIVE INSTILLATION OF CHEMOTHERAPY;  Surgeon: Kathie Rhodes, MD;  Location: [facility];  Service: Urology;  Laterality: N/A;    SOCIAL HISTORY: Social History   Socioeconomic History   Marital status: Divorced    Spouse name: Not on file   Number of children: Not on file   Years of  education: Not on file   Highest education level: Not on file  Occupational History   Not on file  Tobacco Use   Smoking status: Every Day    Packs/day: 0.25    Years: 51.00    Pack years: 12.75    Types: Cigarettes   Smokeless tobacco: Never  Vaping Use   Vaping Use: Never used  Substance and Sexual Activity   Alcohol use: Not Currently    Comment: 2 beers per day   Drug use: Never   Sexual activity: Not on file  Other Topics Concern   Not on file  Social History Narrative   Not on file   Social Determinants of Health   Financial Resource Strain: Medium Risk   Difficulty of Paying Living Expenses: Somewhat hard  Food Insecurity: No Food Insecurity   Worried About Charity fundraiser in the Last Year: Never true   Ran Out of Food in the Last Year: Never true  Transportation Needs: Unmet Transportation Needs   Lack of Transportation (Medical): Yes   Lack of Transportation (Non-Medical): Yes  Physical Activity: Not on file  Stress: Stress Concern Present   Feeling of Stress : To some extent  Social Connections: Moderately Isolated   Frequency of Communication with Friends and Family: More than three times a week   Frequency of Social Gatherings with Friends and Family: More than three times a week   Attends Religious Services: More than 4 times per year   Active Member of [employer] or Organizations: No   Attends Archivist Meetings: Never   Marital Status: Divorced  Human resources officer Violence: Not on file    FAMILY HISTORY: No family history on file.  ALLERGIES:  has No Known Allergies.  MEDICATIONS:  Current Outpatient Medications  Medication Sig Dispense Refill   bisacodyl (DULCOLAX) 5 MG EC tablet Take 5 mg by mouth daily as needed for moderate constipation.     cholecalciferol (VITAMIN D3) 25 MCG (1000 UNIT) tablet Take 1 tablet (1,000 Units total) by mouth daily. 90 tablet 3   HYDROcodone-acetaminophen (NORCO) 10-325 MG tablet Take 1 tablet by mouth  every 6 (six) hours as needed for moderate pain. 30 tablet 0   No current facility-administered medications for this visit.    REVIEW OF SYSTEMS:   10 Point review of Systems was done is negative except as noted above.  PHYSICAL EXAMINATION: ECOG PERFORMANCE STATUS: 2 - Symptomatic, <50% confined to bed .BP (!) 158/99    Pulse 72    Temp 97.9 F (36.6 C)    Resp 18    Wt 141 lb 9.6 oz (64.2 kg)    SpO2 99%    BMI 22.85 kg/m  . GENERAL:alert, in no acute distress and comfortable SKIN: no acute rashes, no significant lesions EYES: conjunctiva are pink and non-injected, sclera anicteric OROPHARYNX: MMM, no exudates, no oropharyngeal erythema or ulceration NECK: supple, no JVD LYMPH:  no palpable lymphadenopathy in the cervical, axillary or inguinal regions LUNGS: clear to auscultation b/l with normal respiratory effort HEART: regular rate & rhythm ABDOMEN:  normoactive bowel sounds , non tender, not distended. Extremity: no pedal edema PSYCH: alert & oriented x 3 with fluent speech NEURO: no focal motor/sensory  deficits'   LABORATORY DATA:  I have reviewed the data as listed  . CBC Latest Ref Rng & Units 11/09/2021 10/19/2021 09/29/2021  WBC 4.0 - 10.5 K/uL 10.6(H) 8.0 7.4  Hemoglobin 13.0 - 17.0 g/dL 12.9(L) 13.1 13.4  Hematocrit 39.0 - 52.0 % 39.3 40.1 41.9  Platelets 150 - 400 K/uL 252 310 224    . CMP Latest Ref Rng & Units 11/20/2021 11/09/2021 10/19/2021  Glucose 70 - 99 mg/dL 118(H) 103(H) 95  BUN 8 - 23 mg/dL 12 17 24(H)  Creatinine 0.61 - 1.24 mg/dL 1.30(H) 1.17 1.14  Sodium 135 - 145 mmol/L 137 141 139  Potassium 3.5 - 5.1 mmol/L 3.8 3.7 4.0  Chloride 98 - 111 mmol/L 103 106 104  CO2 22 - 32 mmol/L 26 29 28   Calcium 8.9 - 10.3 mg/dL 9.0 9.3 9.0  Total Protein 6.5 - 8.1 g/dL - 7.8 7.7  Total Bilirubin 0.3 - 1.2 mg/dL - 1.0 0.5  Alkaline Phos 38 - 126 U/L - 64 60  AST 15 - 41 U/L - 16 16  ALT 0 - 44 U/L - 8 10    RADIOGRAPHIC STUDIES: I have personally reviewed  the radiological images as listed and agreed with the findings in the report.  CT Chest Abdomen Pelvis No IV or Oral Contrast  (03/03/2021)   Anatomical Region Laterality Modality  Chest -- Computed Tomography  Abdomen -- --  Pelvis -- --    Impression  1. Continued bilateral hydronephrosis and ureterectasis  consistent with obstruction from patient's bladder carcinoma. The  wall thickening of the anterior aspect of the bladder appears to  have decreased since previous study. No abdominal or pelvic  lymphadenopathy. Stable small benign-appearing nodules in both  lungs.  [name] CORE BIOPSY (LYMPH NODES)  Result Date: 11/20/2021 INDICATION: History of urothelial carcinoma of the bladder with enlarged and hypermetabolic left inguinal lymph nodes by PET scan suspicious for recurrent metastatic disease. EXAM: ULTRASOUND GUIDED CORE BIOPSY OF LEFT INGUINAL LYMPH NODE MEDICATIONS: None. ANESTHESIA/SEDATION: Moderate (conscious) sedation was employed during this procedure. A total of Versed 1.0 mg and Fentanyl 25 mcg was administered intravenously by radiology nursing. Moderate Sedation Time: 16 minutes. The patient's level of consciousness and vital signs were monitored continuously by radiology nursing throughout the procedure under my direct supervision. PROCEDURE: The procedure, risks, benefits, and alternatives were explained to the patient. Questions regarding the procedure were encouraged and answered. The patient understands and consents to the procedure. A time-out was performed prior to initiating the procedure. Ultrasound was performed to localize left inguinal lymph nodes. The left groin was prepped with chlorhexidine in a sterile fashion, and a sterile drape was applied covering the operative field. A sterile gown and sterile gloves were used for the procedure. Local anesthesia was provided with 1% Lidocaine. Under ultrasound guidance, a 16 gauge core biopsy device was utilized in obtaining 4  separate samples of an enlarged left inguinal lymph node. Core biopsy samples were submitted in saline. COMPLICATIONS: None immediate. FINDINGS: There are multiple adjacent enlarged lymph nodes in the left inguinal region. The largest measures approximately 2.3 cm in length and is located in the medial inguinal region corresponding to one of the hypermetabolic lymph nodes by recent PET scan. This lymph node was sampled yielding solid tissue. IMPRESSION: Ultrasound-guided core biopsy performed of an enlarged left inguinal lymph node. Electronically Signed   By: Aletta Edouard M.D.   On: 11/20/2021 14:47    ASSESSMENT & PLAN:    1.  Urothelial bladder cancer, Stage II pT2N0M0  -Initial diagnosis of stage I bladder cancer in 2019 in [place] treated with TURBT, now with recurrent disease in 3 bladder tumors removed by TURBT 1 with muscle invasion on pathology. He was started on neoadjuvant Cisplatin + Gemcitabine and completed Cycle 1 but had significant renal injury that did not fully recover. His level of renal dysfunction will make him not a candidate for further Cisplatin based chemotherapy. He switched to trimodal approach and completed concurrent chemoradiation with 8 cycles of weekly Gemcitabine in 10/2019. Decision against cystectomy with Urology given he is a poor surgical candidate. -He underwent cystoscopy in 08/2020 with no residual tumor noted on exam but bladder washings positive for high grade urothelial carcinoma cells likely representing a carcinoma in situ. His case was discussed at multidisciplinary tumor board with recommendation to consider BCG treatments given he has never had localized bladder cancer treatments. He completed 6 cycle of BCG plus interferon intravesicular late on 12/01/2020. Surveillance scans to [facility] showed no evidence of distant disease but unfortunately most recent cystoscopy from 01/2021 still with residual high-grade urothelial carcinoma. -His case was  discussed again at multidisciplinary tumor board with recommendation for immunotherapy. -Recommended starting on Keytruda 200 mg daily but patient unfortunately no showed to last 2 infusion appointments and has not started on treatment yet. He has a long history of missing appointments previously.   2. CKD Stage III -Renal function stable today. 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 Dysuria/Hematuria/bladder spasms prophylactic treatments for UTI in past have not helped. -Supposed to follow-up with Dr. Warrick Parisian /Urology at [facility] -Patient has missed a couple of his appointments for ultrasound-guided biopsy of possible metastatic lymph node. -We shall proceed with his planned immunotherapy today pending repeat biopsy. -If the biopsy shows progressive metastatic urothelial carcinoma will need to discuss goals of care and possible next treatment options. -No notable toxicities from pembrolizumab at this time -No notable acute new symptoms from possible progressive urothelial carcinoma.   4.  Previous low TSH-repeat TSH on 09/29/2021 is within normal limits at 3.209 -Continue to monitor  FOLLOW UP: Follow-up after lymph node biopsy to determine further goals of care and next treatment options  The total time spent in the appointment was 30 minutes*.  All of the patient's questions were answered with apparent satisfaction. The patient knows to call the clinic with any problems, questions or concerns.   Sullivan Lone MD MS AAHIVMS [facility] Hematology/Oncology Physician [facility]  .*Total Encounter Time as defined by the Centers for Medicare and Medicaid Services includes, in addition to the face-to-face time of a patient visit (documented in the note above) non-face-to-face time: obtaining and reviewing outside history, ordering and reviewing medications, tests or procedures, care coordination (communications with  other health care professionals or caregivers) and documentation in the medical record.

## 2021-11-16 ENCOUNTER — Other Ambulatory Visit: Payer: Self-pay | Admitting: Radiology

## 2021-11-17 ENCOUNTER — Other Ambulatory Visit: Payer: Self-pay | Admitting: Internal Medicine

## 2021-11-20 ENCOUNTER — Other Ambulatory Visit: Payer: Self-pay

## 2021-11-20 ENCOUNTER — Ambulatory Visit (HOSPITAL_COMMUNITY)
Admission: RE | Admit: 2021-11-20 | Discharge: 2021-11-20 | Disposition: A | Discharge status: Home / Self Care | Payer: Medicare Other | Source: Ambulatory Visit | Attending: Hematology | Admitting: Hematology

## 2021-11-20 DIAGNOSIS — C679 Malignant neoplasm of bladder, unspecified: Secondary | ICD-10-CM | POA: Insufficient documentation

## 2021-11-20 DIAGNOSIS — Z5112 Encounter for antineoplastic immunotherapy: Secondary | ICD-10-CM | POA: Diagnosis not present

## 2021-11-20 DIAGNOSIS — R590 Localized enlarged lymph nodes: Secondary | ICD-10-CM | POA: Diagnosis present

## 2021-11-20 LAB — BASIC METABOLIC PANEL
Anion gap: 8 (ref 5–15)
BUN: 12 mg/dL (ref 8–23)
CO2: 26 mmol/L (ref 22–32)
Calcium: 9 mg/dL (ref 8.9–10.3)
Chloride: 103 mmol/L (ref 98–111)
Creatinine, Ser: 1.3 mg/dL — ABNORMAL HIGH (ref 0.61–1.24)
GFR, Estimated: 59 mL/min — ABNORMAL LOW (ref >60)
Glucose, Bld: 118 mg/dL — ABNORMAL HIGH (ref 70–99)
Potassium: 3.8 mmol/L (ref 3.5–5.1)
Sodium: 137 mmol/L (ref 135–145)

## 2021-11-20 LAB — PROTIME-INR
INR: 1 (ref 0.8–1.2)
Prothrombin Time: 13.4 seconds (ref 11.4–15.2)

## 2021-11-20 MED ORDER — FENTANYL CITRATE (PF) 100 MCG/2ML IJ SOLN
INTRAMUSCULAR | Status: AC
  Filled 2021-11-20: qty 2

## 2021-11-20 MED ORDER — MIDAZOLAM HCL 2 MG/2ML IJ SOLN
INTRAMUSCULAR | Status: AC
  Filled 2021-11-20: qty 2

## 2021-11-20 MED ORDER — LIDOCAINE HCL (PF) 1 % IJ SOLN
INTRAMUSCULAR | Status: AC
  Filled 2021-11-20: qty 30

## 2021-11-20 MED ORDER — FENTANYL CITRATE (PF) 100 MCG/2ML IJ SOLN
INTRAMUSCULAR | Status: AC | PRN
  Administered 2021-11-20: 25 ug via INTRAVENOUS

## 2021-11-20 MED ORDER — MIDAZOLAM HCL 2 MG/2ML IJ SOLN
INTRAMUSCULAR | Status: AC | PRN
  Administered 2021-11-20: 1 mg via INTRAVENOUS

## 2021-11-20 MED ORDER — SODIUM CHLORIDE 0.9 % IV SOLN: INTRAVENOUS | Status: DC

## 2021-11-20 NOTE — H&P (Addendum)
Chief Complaint: Patient was seen in consultation today for lymph node biopsy   Referring Physician(s): Brunetta Genera  Supervising Physician: Dr. Kathlene Cote  Patient Status: [facility] - Out-pt  History of Present Illness: Luis House is a 71 y.o. male with a medical history significant for locally advanced muscle invasive bladder cancer, initially diagnosed in 2019. He is status post TURBT in 2019 and TURBT with intravesicular gemcitabine 07/2019. He has also received additional therapies including chemotherapy and BCG with interferon. He is currently receiving palliative immunotherapy and a restaging PET showed possible nodal metastases.   PET 09/04/21 IMPRESSION: 1. Interval development of left inguinal and external iliac nodal metastasis compared to 04/18/2021. 2. Persistent bladder wall thickening, suboptimally evaluated by PET. 3. Thoracic nodal hypermetabolism which could be reactive or represent an atypical distribution of nodal metastasis. Reactive etiology is slightly favored. 4. Nonspecific small pulmonary nodules. 5. Diffuse thyroid hypermetabolism can be seen with thyroiditis. Consider correlation with thyroid function tests. 6. Incidental findings, including: Coronary artery atherosclerosis. Aortic Atherosclerosis (ICD10-I70.0).    Interventional Radiology has been asked to evaluate this patient for an image-guided lymph node biopsy. Imaging reviewed and procedure approved by Dr. Pascal Lux.   Past Medical History:  Diagnosis Date   Arthritis    wrist and knee   Bladder tumor    Cataract    right eye   Collar bone fracture 2017   left    Past Surgical History:  Procedure Laterality Date   MULTIPLE TOOTH EXTRACTIONS     TRANSURETHRAL RESECTION OF BLADDER TUMOR N/A 01/09/2019   Procedure: TRANSURETHRAL RESECTION OF BLADDER TUMOR (TURBT)/ POST OPERATIVE INSTILLATION OF CHEMOTHERAPY;  Surgeon: Kathie Rhodes, MD;  Location: [facility];  Service: Urology;   Laterality: N/A;    Allergies: Patient has no known allergies.  Medications: Prior to Admission medications   Medication Sig Start Date End Date Taking? Authorizing Provider  bisacodyl (DULCOLAX) 5 MG EC tablet Take 5 mg by mouth daily as needed for moderate constipation.   Yes [provider]  cholecalciferol (VITAMIN D3) 25 MCG (1000 UNIT) tablet Take 1 tablet (1,000 Units total) by mouth daily. 09/29/21  Yes Causey, Charlestine Massed, NP  HYDROcodone-acetaminophen (NORCO) 10-325 MG tablet Take 1 tablet by mouth every 6 (six) hours as needed for moderate pain. 11/09/21  Yes Brunetta Genera, MD     No family history on file.  Social History   Socioeconomic History   Marital status: Divorced    Spouse name: Not on file   Number of children: Not on file   Years of education: Not on file   Highest education level: Not on file  Occupational History   Not on file  Tobacco Use   Smoking status: Every Day    Packs/day: 0.25    Years: 51.00    Pack years: 12.75    Types: Cigarettes   Smokeless tobacco: Never  Vaping Use   Vaping Use: Never used  Substance and Sexual Activity   Alcohol use: Not Currently    Comment: 2 beers per day   Drug use: Never   Sexual activity: Not on file  Other Topics Concern   Not on file  Social History Narrative   Not on file   Social Determinants of Health   Financial Resource Strain: Medium Risk   Difficulty of Paying Living Expenses: Somewhat hard  Food Insecurity: No Food Insecurity   Worried About Running Out of Food in the Last Year: Never true   Ran Out  of Food in the Last Year: Never true  Transportation Needs: Unmet Transportation Needs   Lack of Transportation (Medical): Yes   Lack of Transportation (Non-Medical): Yes  Physical Activity: Not on file  Stress: Stress Concern Present   Feeling of Stress : To some extent  Social Connections: Moderately Isolated   Frequency of Communication with Friends and Family: More  than three times a week   Frequency of Social Gatherings with Friends and Family: More than three times a week   Attends Religious Services: More than 4 times per year   Active Member of [employer] or Organizations: No   Attends Archivist Meetings: Never   Marital Status: Divorced    Review of Systems: A 12 point ROS discussed and pertinent positives are indicated in the HPI above.  All other systems are negative.  Review of Systems  Constitutional:  Positive for appetite change and fatigue.  Respiratory:  Negative for cough and shortness of breath.   Cardiovascular:  Negative for chest pain and leg swelling.  Gastrointestinal:  Positive for nausea. Negative for abdominal pain, diarrhea and vomiting.  Genitourinary:  Positive for hematuria.  Neurological:  Positive for headaches.  Hematological:  Positive for adenopathy.   Vital Signs: BP (!) 121/95    Pulse 79    Temp 98.4 F (36.9 C) (Oral)    Resp 16    Ht 5\' 7"  (1.702 m)    Wt 148 lb (67.1 kg)    SpO2 99%    BMI 23.18 kg/m   Physical Exam Constitutional:      General: He is not in acute distress. HENT:     Mouth/Throat:     Mouth: Mucous membranes are moist.     Pharynx: Oropharynx is clear.  Cardiovascular:     Rate and Rhythm: Normal rate and regular rhythm.     Pulses: Normal pulses.     Heart sounds: Normal heart sounds.  Pulmonary:     Effort: Pulmonary effort is normal.     Breath sounds: Normal breath sounds.  Abdominal:     General: Bowel sounds are normal.     Palpations: Abdomen is soft.  Musculoskeletal:     Right lower leg: No edema.     Left lower leg: No edema.  Lymphadenopathy:     Lower Body: Left inguinal adenopathy present.     Comments: Palpable left inguinal lymph nodes.   Skin:    General: Skin is warm and dry.  Neurological:     Mental Status: He is alert and oriented to person, place, and time.    Imaging: No results found.  Labs:  CBC: Recent Labs    09/08/21 0904  09/29/21 1425 10/19/21 1054 11/09/21 0857  WBC 7.6 7.4 8.0 10.6*  HGB 12.4* 13.4 13.1 12.9*  HCT 39.4 41.9 40.1 39.3  PLT 263 224 310 252    COAGS: No results for input(s): INR, APTT in the last 8760 hours.  BMP: Recent Labs    09/08/21 0904 09/29/21 1425 10/19/21 1054 11/09/21 0857  NA 139 139 139 141  K 3.9 4.4 4.0 3.7  CL 105 105 104 106  CO2 24 27 28 29   GLUCOSE 102* 113* 95 103*  BUN 17 16 24* 17  CALCIUM 8.3* 9.0 9.0 9.3  CREATININE 1.21 1.26* 1.14 1.17  GFRNONAA >60 >60 >60 >60    LIVER FUNCTION TESTS: Recent Labs    09/08/21 0904 09/29/21 1425 10/19/21 1054 11/09/21 0857  BILITOT 0.3 0.7 0.5  1.0  AST 18 18 16 16   ALT 13 13 10 8   ALKPHOS 63 64 60 64  PROT 7.5 7.9 7.7 7.8  ALBUMIN 3.7 4.1 3.9 4.2    TUMOR MARKERS: No results for input(s): AFPTM, CEA, CA199, CHROMGRNA in the last 8760 hours.  Assessment and Plan:  Bladder cancer with nodal metastases: Mallie Mussel L. Meda Coffee, 71 year old male, presents today to the [facility] department for an image-guided lymph node biopsy.  Risks and benefits of this procedure were discussed with the patient and/or patient's family including, but not limited to bleeding, infection, damage to adjacent structures or low yield requiring additional tests.  All of the questions were answered and there is agreement to proceed. He has been NPO.   Consent signed and in chart.  Thank you for this interesting consult.  I greatly enjoyed meeting Karan Ramnauth and look forward to participating in their care.  A copy of this report was sent to the requesting provider on this date.  Electronically Signed: Soyla Dryer, AGACNP-BC [phone] 11/20/2021, 12:00 PM   I spent a total of  30 Minutes   in face to face in clinical consultation, greater than 50% of which was counseling/coordinating care for lymph node biopsy

## 2021-11-20 NOTE — Procedures (Signed)
Interventional Radiology Procedure Note  Procedure: US Guided Biopsy of left inguinal lymph node  Complications: None  Estimated Blood Loss: < 10 mL  Findings: 6 G core biopsy of left inguinal LN performed under US guidance.  Four core samples obtained and sent to Pathology.  Venetia Night. Kathlene Cote, M.D Pager:  [phone]

## 2021-11-21 LAB — SURGICAL PATHOLOGY

## 2021-11-28 ENCOUNTER — Telehealth: Payer: Self-pay | Admitting: Hematology

## 2021-11-28 ENCOUNTER — Other Ambulatory Visit: Payer: Self-pay

## 2021-11-28 DIAGNOSIS — C679 Malignant neoplasm of bladder, unspecified: Secondary | ICD-10-CM

## 2021-11-28 NOTE — Telephone Encounter (Signed)
.  Called patient to schedule appointment per 2/20 inbasket, patient is aware of date and time.   °

## 2021-11-29 ENCOUNTER — Inpatient Hospital Stay (HOSPITAL_BASED_OUTPATIENT_CLINIC_OR_DEPARTMENT_OTHER): Payer: Medicare Other | Admitting: Hematology

## 2021-11-29 ENCOUNTER — Inpatient Hospital Stay: Payer: Medicare Other

## 2021-11-29 ENCOUNTER — Other Ambulatory Visit: Payer: Self-pay

## 2021-11-29 DIAGNOSIS — C679 Malignant neoplasm of bladder, unspecified: Secondary | ICD-10-CM | POA: Diagnosis not present

## 2021-11-29 DIAGNOSIS — Z5112 Encounter for antineoplastic immunotherapy: Secondary | ICD-10-CM | POA: Diagnosis not present

## 2021-11-29 LAB — CMP (CANCER CENTER ONLY)
ALT: <5 U/L (ref 0–44)
AST: 15 U/L (ref 15–41)
Albumin: 4.3 g/dL (ref 3.5–5.0)
Alkaline Phosphatase: 61 U/L (ref 38–126)
Anion gap: 12 (ref 5–15)
BUN: 15 mg/dL (ref 8–23)
CO2: 23 mmol/L (ref 22–32)
Calcium: 9.4 mg/dL (ref 8.9–10.3)
Chloride: 103 mmol/L (ref 98–111)
Creatinine: 1.28 mg/dL — ABNORMAL HIGH (ref 0.61–1.24)
GFR, Estimated: 60 mL/min — ABNORMAL LOW (ref >60)
Glucose, Bld: 109 mg/dL — ABNORMAL HIGH (ref 70–99)
Potassium: 3.9 mmol/L (ref 3.5–5.1)
Sodium: 138 mmol/L (ref 135–145)
Total Bilirubin: 0.6 mg/dL (ref 0.3–1.2)
Total Protein: 8.4 g/dL — ABNORMAL HIGH (ref 6.5–8.1)

## 2021-11-29 LAB — CBC WITH DIFFERENTIAL (CANCER CENTER ONLY)
Abs Immature Granulocytes: 0.02 10*3/uL (ref 0.00–0.07)
Basophils Absolute: 0.1 10*3/uL (ref 0.0–0.1)
Basophils Relative: 1 %
Eosinophils Absolute: 0.1 10*3/uL (ref 0.0–0.5)
Eosinophils Relative: 1 %
HCT: 43 % (ref 39.0–52.0)
Hemoglobin: 13.7 g/dL (ref 13.0–17.0)
Immature Granulocytes: 0 %
Lymphocytes Relative: 11 %
Lymphs Abs: 0.8 10*3/uL (ref 0.7–4.0)
MCH: 26.8 pg (ref 26.0–34.0)
MCHC: 31.9 g/dL (ref 30.0–36.0)
MCV: 84.1 fL (ref 80.0–100.0)
Monocytes Absolute: 0.5 10*3/uL (ref 0.1–1.0)
Monocytes Relative: 7 %
Neutro Abs: 6.1 10*3/uL (ref 1.7–7.7)
Neutrophils Relative %: 80 %
Platelet Count: 288 10*3/uL (ref 150–400)
RBC: 5.11 MIL/uL (ref 4.22–5.81)
RDW: 15.2 % (ref 11.5–15.5)
WBC Count: 7.6 10*3/uL (ref 4.0–10.5)
nRBC: 0 % (ref 0.0–0.2)

## 2021-11-29 MED ORDER — SENNOSIDES-DOCUSATE SODIUM 8.6-50 MG PO TABS: 2.0000 | ORAL_TABLET | Freq: Every evening | ORAL | 1 refills | Status: DC | PRN

## 2021-11-29 MED ORDER — OMEPRAZOLE 40 MG PO CPDR: 40.0000 mg | DELAYED_RELEASE_CAPSULE | Freq: Every day | ORAL | 1 refills | Status: DC

## 2021-11-29 MED ORDER — POLYETHYLENE GLYCOL 3350 17 G PO PACK: 17.0000 g | PACK | Freq: Every day | ORAL | 1 refills | Status: DC

## 2021-11-29 MED ORDER — HYDROCODONE-ACETAMINOPHEN 10-325 MG PO TABS: 1.0000 | ORAL_TABLET | Freq: Four times a day (QID) | ORAL | 0 refills | Status: DC | PRN

## 2021-12-06 ENCOUNTER — Encounter: Payer: Self-pay | Admitting: Hematology

## 2021-12-06 NOTE — Progress Notes (Addendum)
HEMATOLOGY/ONCOLOGY CLINIC NOTE  Date of Service: .11/29/2021   Patient Care Team: Luis Perna, NP as PCP - General (Internal Medicine)  CHIEF COMPLAINTS/PURPOSE OF CONSULTATION:  Follow-up for continued evaluation and management of muscle invasive urothelial carcinoma  HISTORY OF PRESENTING ILLNESS:   Luis House is a wonderful 71 y.o. male who has been referred to [name] from the [facility] for evaluation and continued management of his locally advanced muscle invasive bladder cancer following his recent visit to ED for hematuria.  Patients daughter joins [name] by phone.  Patient has recently moved from [place], [place] where he was receiving his care from Dr [facility]  His oncologic history is as follows based on outside medical records-  Diagnosis: Bladder Cancer   Date of Diagnosis: 2019  Recurrence 06/18/19   Pathology: Initial pT1 disease in 2019 from [facility] (no path report available TURBT 06/18/19: A) URINARY BLADDER TUMOR (ANTERIOR WALL), TUR - PAPILLARY UROTHELIAL CARCINOMA, HIGH-GRADE - TUMOR EXTENSIVELY INVADES LAMINA PROPRIA - MUSCULARIS PROPRIA IS PRESENT WITH NO EVIDENCE OF DEFINITE INVASION   B) URINARY BLADDER TUMOR (POSTERIOR WALL), TUR - PAPILLARY UROTHELIAL CARCINOMA, HIGH-GRADE - TUMOR INVADES MUSCULARIS PROPRIA   C) URINARY BLADDER TUMOR (NECK), TUR - PAPILLARY UROTHELIAL CARCINOMA, HIGH-GRADE - TUMOR INVADES LAMINA PROPRIA - MUSCULARIS PROPRIA IS PRESENT WITH NO EVIDENCE OF DEFINITE INVASION    Cancer Stage: Initial pT1, recurrent pT2Nx Cancer Staging No matching staging information was found for the patient.   Sites of Disease: Bladder   Previous Treatment: TURBT 2019 in [place] -TURBT with intravesicular Gemcitabine 07/10/19  -Neoadjuvant Cisplatin + Gemcitabine C1 07/29/19 (stopped after Cycle 1 due to renal injury) -Concurrent chemoradiation with Gemcitabine 70mg /m2 weekly completed 8 weekly cycles from 09/08/19-10/27/19.  -6  doses of BCG with interferon . completed 12/01/2020  Current Treatment: Pembrolizumab 200mg  Q3 weeks: C1 03/09/21    The pt reports intermittent hematuria and lower abdominal pain. Patient daughter notes he cannot follow with [facility] due to unpaid debt from previous visits.  Lab results 04/18/2021 of CBC w/diff and CMP is as follows: all values are WNL except for Hgb of 12.4, Chloride if 119, CO2 of 20, Calcium of 6.1, Total protein of 5.4, Albumin of 2.5, Alkaline Phosphatase of 37, Total bilirubin of 1.6.  On review of systems, pt reports unquantified weight loss and denies chest pain or shortness of breath any other symptoms.  INTERVAL HISTORY  Mr [name] i for continued evaluation and management of his muscle invasive urothelial carcinoma.   He is here today for follow-up with his daughter present. He has finally had his lymph node biopsy done after multiple delays Fortunately lymph node biopsy shows metastatic carcinoma consistent with invasive papillary urothelial carcinoma. We discussed that he has progressed on his current palliative pembrolizumab treatment. We discussed we need to get repeat imaging studies with repeat PET CT scan scheduled for 12/18/2021. We discussed in detail goals of care. Patient notes that he is keen to consider further palliative treatments as opposed to transitioning to best supportive cares at this time. Still having some intermittent hematuria and bladder cramping. No shortness of breath.  No chest pain.  No other acute new focal symptoms. Labs done today CBC within normal limits, CMP stable creatinine 1.28.    MEDICAL HISTORY:  Past Medical History:  Diagnosis Date   Arthritis    wrist and knee   Bladder tumor    Cataract    right eye   Collar bone  fracture 2017   left    SURGICAL HISTORY: Past Surgical History:  Procedure Laterality Date   MULTIPLE TOOTH EXTRACTIONS     TRANSURETHRAL RESECTION OF BLADDER TUMOR N/A 01/09/2019    Procedure: TRANSURETHRAL RESECTION OF BLADDER TUMOR (TURBT)/ POST OPERATIVE INSTILLATION OF CHEMOTHERAPY;  Surgeon: Luis Rhodes, MD;  Location: [facility];  Service: House;  Laterality: N/A;    SOCIAL HISTORY: Social History   Socioeconomic History   Marital status: Divorced    Spouse name: Not on file   Number of children: Not on file   Years of education: Not on file   Highest education level: Not on file  Occupational History   Not on file  Tobacco Use   Smoking status: Every Day    Packs/day: 0.25    Years: 51.00    Pack years: 12.75    Types: Cigarettes   Smokeless tobacco: Never  Vaping Use   Vaping Use: Never used  Substance and Sexual Activity   Alcohol use: Not Currently    Comment: 2 beers per day   Drug use: Never   Sexual activity: Not on file  Other Topics Concern   Not on file  Social History Narrative   Not on file   Social Determinants of Health   Financial Resource Strain: Medium Risk   Difficulty of Paying Living Expenses: Somewhat hard  Food Insecurity: No Food Insecurity   Worried About Charity fundraiser in the Last Year: Never true   Ran Out of Food in the Last Year: Never true  Transportation Needs: Unmet Transportation Needs   Lack of Transportation (Medical): Yes   Lack of Transportation (Non-Medical): Yes  Physical Activity: Not on file  Stress: Stress Concern Present   Feeling of Stress : To some extent  Social Connections: Moderately Isolated   Frequency of Communication with Friends and Family: More than three times a week   Frequency of Social Gatherings with Friends and Family: More than three times a week   Attends Religious Services: More than 4 times per year   Active Member of [employer] or Organizations: No   Attends Archivist Meetings: Never   Marital Status: Divorced  Human resources officer Violence: Not on file    FAMILY HISTORY: No family history on file.  ALLERGIES:  has No Known Allergies.  MEDICATIONS:   Current Outpatient Medications  Medication Sig Dispense Refill   omeprazole (PRILOSEC) 40 MG capsule Take 1 capsule (40 mg total) by mouth daily before breakfast. 30 capsule 1   polyethylene glycol (MIRALAX) 17 g packet Take 17 g by mouth daily. 30 each 1   senna-docusate (SENNA S) 8.6-50 MG tablet Take 2 tablets by mouth at bedtime as needed for mild constipation or moderate constipation. 60 tablet 1   bisacodyl (DULCOLAX) 5 MG EC tablet Take 5 mg by mouth daily as needed for moderate constipation.     cholecalciferol (VITAMIN D3) 25 MCG (1000 UNIT) tablet Take 1 tablet (1,000 Units total) by mouth daily. 90 tablet 3   HYDROcodone-acetaminophen (NORCO) 10-325 MG tablet Take 1 tablet by mouth every 6 (six) hours as needed for moderate pain. 30 tablet 0   No current facility-administered medications for this visit.    REVIEW OF SYSTEMS:   10 Point review of Systems was done is negative except as noted above.  PHYSICAL EXAMINATION: ECOG PERFORMANCE STATUS: 2 - Symptomatic, <50% confined to bed .BP (!) 141/87 (BP Location: Right Arm, Patient Position: Sitting)    Pulse 80  Temp 97.9 F (36.6 C) (Temporal)    Resp 16    Wt 133 lb 6.4 oz (60.5 kg)    SpO2 99%    BMI 20.89 kg/m  NAD GENERAL:alert, in no acute distress and comfortable SKIN: no acute rashes, no significant lesions EYES: conjunctiva are pink and non-injected, sclera anicteric OROPHARYNX: MMM, no exudates, no oropharyngeal erythema or ulceration NECK: supple, no JVD LYMPH:  no palpable lymphadenopathy in the cervical, axillary or inguinal regions LUNGS: clear to auscultation b/l with normal respiratory effort HEART: regular rate & rhythm ABDOMEN:  normoactive bowel sounds , non tender, not distended. Extremity: no pedal edema PSYCH: alert & oriented x 3 with fluent speech NEURO: no focal motor/sensory deficits   LABORATORY DATA:  I have reviewed the data as listed  . CBC Latest Ref Rng & Units 11/29/2021 11/09/2021  10/19/2021  WBC 4.0 - 10.5 K/uL 7.6 10.6(H) 8.0  Hemoglobin 13.0 - 17.0 g/dL 13.7 12.9(L) 13.1  Hematocrit 39.0 - 52.0 % 43.0 39.3 40.1  Platelets 150 - 400 K/uL 288 252 310    . CMP Latest Ref Rng & Units 11/29/2021 11/20/2021 11/09/2021  Glucose 70 - 99 mg/dL 109(H) 118(H) 103(H)  BUN 8 - 23 mg/dL 15 12 17   Creatinine 0.61 - 1.24 mg/dL 1.28(H) 1.30(H) 1.17  Sodium 135 - 145 mmol/L 138 137 141  Potassium 3.5 - 5.1 mmol/L 3.9 3.8 3.7  Chloride 98 - 111 mmol/L 103 103 106  CO2 22 - 32 mmol/L 23 26 29   Calcium 8.9 - 10.3 mg/dL 9.4 9.0 9.3  Total Protein 6.5 - 8.1 g/dL 8.4(H) - 7.8  Total Bilirubin 0.3 - 1.2 mg/dL 0.6 - 1.0  Alkaline Phos 38 - 126 U/L 61 - 64  AST 15 - 41 U/L 15 - 16  ALT 0 - 44 U/L <5 - 8   SURGICAL PATHOLOGY  CASE: [id number]  PATIENT: Ky Barban  Surgical Pathology Report      Clinical History: history of invasive papillary urothelial carcinoma of  bladder, palpable left inguinal lymph node with imaging evidence of  probable metastatic lymph node disease (cm)      FINAL MICROSCOPIC DIAGNOSIS:   A. LYMPH NODE, LEFT INGUINAL, NEEDLE CORE BIOPSY:  Findings consistent with metastatic carcinoma.  See comment.   Comment:  The tumor seen in the current specimen is morphologically consistent  with clinical history of invasive papillary urothelial carcinoma  Tissue is available for ancillary studies upon request.  Clinical correlation is required.  As per departmental protocol the malignancy was confirmed by Dr. Thressa Sheller.   RADIOGRAPHIC STUDIES: I have personally reviewed the radiological images as listed and agreed with the findings in the report.  CT Chest Abdomen Pelvis No IV or Oral Contrast  (03/03/2021)   Anatomical Region Laterality Modality  Chest -- Computed Tomography  Abdomen -- --  Pelvis -- --    Impression  1. Continued bilateral hydronephrosis and ureterectasis  consistent with obstruction from patient's bladder carcinoma. The   wall thickening of the anterior aspect of the bladder appears to  have decreased since previous study. No abdominal or pelvic  lymphadenopathy. Stable small benign-appearing nodules in both  lungs.  [name] CORE BIOPSY (LYMPH NODES)  Result Date: 11/20/2021 INDICATION: History of urothelial carcinoma of the bladder with enlarged and hypermetabolic left inguinal lymph nodes by PET scan suspicious for recurrent metastatic disease. EXAM: ULTRASOUND GUIDED CORE BIOPSY OF LEFT INGUINAL LYMPH NODE MEDICATIONS: None. ANESTHESIA/SEDATION: Moderate (conscious) sedation was employed during this  procedure. A total of Versed 1.0 mg and Fentanyl 25 mcg was administered intravenously by radiology nursing. Moderate Sedation Time: 16 minutes. The patient's level of consciousness and vital signs were monitored continuously by radiology nursing throughout the procedure under my direct supervision. PROCEDURE: The procedure, risks, benefits, and alternatives were explained to the patient. Questions regarding the procedure were encouraged and answered. The patient understands and consents to the procedure. A time-out was performed prior to initiating the procedure. Ultrasound was performed to localize left inguinal lymph nodes. The left groin was prepped with chlorhexidine in a sterile fashion, and a sterile drape was applied covering the operative field. A sterile gown and sterile gloves were used for the procedure. Local anesthesia was provided with 1% Lidocaine. Under ultrasound guidance, a 16 gauge core biopsy device was utilized in obtaining 4 separate samples of an enlarged left inguinal lymph node. Core biopsy samples were submitted in saline. COMPLICATIONS: None immediate. FINDINGS: There are multiple adjacent enlarged lymph nodes in the left inguinal region. The largest measures approximately 2.3 cm in length and is located in the medial inguinal region corresponding to one of the hypermetabolic lymph nodes by recent PET  scan. This lymph node was sampled yielding solid tissue. IMPRESSION: Ultrasound-guided core biopsy performed of an enlarged left inguinal lymph node. Electronically Signed   By: Aletta Edouard M.D.   On: 11/20/2021 14:47    ASSESSMENT & PLAN:    1. Urothelial bladder cancer, Stage II [id number]  -Initial diagnosis of stage I bladder cancer in 2019 in [place] treated with TURBT, now with recurrent disease in 3 bladder tumors removed by TURBT 1 with muscle invasion on pathology. He was started on neoadjuvant Cisplatin + Gemcitabine and completed Cycle 1 but had significant renal injury that did not fully recover. His level of renal dysfunction will make him not a candidate for further Cisplatin based chemotherapy. He switched to trimodal approach and completed concurrent chemoradiation with 8 cycles of weekly Gemcitabine in 10/2019. Decision against cystectomy with House given he is a poor surgical candidate. -He underwent cystoscopy in 08/2020 with no residual tumor noted on exam but bladder washings positive for high grade urothelial carcinoma cells likely representing a carcinoma in situ. His case was discussed at multidisciplinary tumor board with recommendation to consider BCG treatments given he has never had localized bladder cancer treatments. He completed 6 cycle of BCG plus interferon intravesicular late on 12/01/2020. Surveillance scans to [facility] showed no evidence of distant disease but unfortunately most recent cystoscopy from 01/2021 still with residual high-grade urothelial carcinoma. -His case was discussed again at multidisciplinary tumor board with recommendation for immunotherapy. -Recommended starting on Keytruda 200 mg daily but patient unfortunately no showed to last 2 infusion appointments and has not started on treatment yet. He has a long history of missing appointments previously.   2. CKD Stage III -Renal function stable today. Patient with intrinsic kidney disease  following cisplatin therapy previously  3. Dysuria/Hematuria/bladder spasms prophylactic treatments for UTI in past have not helped. -Supposed to follow-up with Dr. Warrick Parisian /House at [facility] -Lab results from today reviewed with the patient. -We discussed his lymph node biopsy that is consistent with metastatic papillary urothelial carcinoma. -We discussed that this now definitively represents stage IV disease which is not curable. -We had detailed goals of care discussions regarding pursuing best supportive cares versus consideration of further palliative treatments. -We will send out tissue for foundation 1 NGS testing to evaluate for possibility of targeted  therapies. -We discussed that patient is not a very good candidate for chemotherapy given his functional status and medical issues and previous poor tolerance to chemotherapy. -Reviewed PET CT scan to evaluate for disease progression  -Pain medications, PPI for acid reflux refilled  4.  Previous low TSH-repeat TSH on 09/29/2021 is within normal limits at 3.209 -Continue to monitor  FOLLOW UP: PET CT scan -Further treatment planning per PET and tumor molecular testing  The total time spent in the appointment was 34 minutes*.  All of the patient's questions were answered with apparent satisfaction. The patient knows to call the clinic with any problems, questions or concerns.   Sullivan Lone MD MS AAHIVMS [facility] [place] Hematology/Oncology Physician [facility]  .*Total Encounter Time as defined by the Centers for Medicare and Medicaid Services includes, in addition to the face-to-face time of a patient visit (documented in the note above) non-face-to-face time: obtaining and reviewing outside history, ordering and reviewing medications, tests or procedures, care coordination (communications with other health care professionals or caregivers) and documentation in the medical record.

## 2021-12-11 ENCOUNTER — Telehealth: Payer: Self-pay | Admitting: Hematology

## 2021-12-11 NOTE — Telephone Encounter (Signed)
Scheduled follow-up appointment per nurse's request. Patient's daughter is aware. ?

## 2021-12-11 NOTE — Progress Notes (Signed)
Foundation One testing requested on Lymph node biopsy  ?

## 2021-12-13 ENCOUNTER — Observation Stay (HOSPITAL_COMMUNITY)
Admission: EM | Admit: 2021-12-13 | Discharge: 2021-12-14 | Disposition: A | Discharge status: Home / Self Care | Payer: Medicare Other | Attending: Family Medicine | Admitting: Family Medicine

## 2021-12-13 ENCOUNTER — Emergency Department (HOSPITAL_COMMUNITY): Payer: Medicare Other

## 2021-12-13 ENCOUNTER — Inpatient Hospital Stay (HOSPITAL_COMMUNITY): Payer: Medicare Other

## 2021-12-13 ENCOUNTER — Other Ambulatory Visit: Payer: Self-pay

## 2021-12-13 DIAGNOSIS — Z95828 Presence of other vascular implants and grafts: Secondary | ICD-10-CM

## 2021-12-13 DIAGNOSIS — D6489 Other specified anemias: Principal | ICD-10-CM | POA: Insufficient documentation

## 2021-12-13 DIAGNOSIS — R310 Gross hematuria: Secondary | ICD-10-CM | POA: Diagnosis not present

## 2021-12-13 DIAGNOSIS — Z20822 Contact with and (suspected) exposure to covid-19: Secondary | ICD-10-CM | POA: Insufficient documentation

## 2021-12-13 DIAGNOSIS — R001 Bradycardia, unspecified: Secondary | ICD-10-CM

## 2021-12-13 DIAGNOSIS — R590 Localized enlarged lymph nodes: Secondary | ICD-10-CM

## 2021-12-13 DIAGNOSIS — K5900 Constipation, unspecified: Secondary | ICD-10-CM | POA: Insufficient documentation

## 2021-12-13 DIAGNOSIS — Z79899 Other long term (current) drug therapy: Secondary | ICD-10-CM | POA: Diagnosis not present

## 2021-12-13 DIAGNOSIS — R5383 Other fatigue: Secondary | ICD-10-CM | POA: Diagnosis present

## 2021-12-13 DIAGNOSIS — N189 Chronic kidney disease, unspecified: Secondary | ICD-10-CM | POA: Insufficient documentation

## 2021-12-13 DIAGNOSIS — Z8551 Personal history of malignant neoplasm of bladder: Secondary | ICD-10-CM | POA: Insufficient documentation

## 2021-12-13 DIAGNOSIS — D649 Anemia, unspecified: Secondary | ICD-10-CM | POA: Diagnosis present

## 2021-12-13 DIAGNOSIS — R197 Diarrhea, unspecified: Secondary | ICD-10-CM | POA: Diagnosis not present

## 2021-12-13 DIAGNOSIS — C679 Malignant neoplasm of bladder, unspecified: Secondary | ICD-10-CM | POA: Diagnosis present

## 2021-12-13 LAB — CBC
HCT: 23.1 % — ABNORMAL LOW (ref 39.0–52.0)
Hemoglobin: 7.1 g/dL — ABNORMAL LOW (ref 13.0–17.0)
MCH: 27.2 pg (ref 26.0–34.0)
MCHC: 30.7 g/dL (ref 30.0–36.0)
MCV: 88.5 fL (ref 80.0–100.0)
Platelets: 164 10*3/uL (ref 150–400)
RBC: 2.61 MIL/uL — ABNORMAL LOW (ref 4.22–5.81)
RDW: 15.4 % (ref 11.5–15.5)
WBC: 6.2 10*3/uL (ref 4.0–10.5)
nRBC: 0 % (ref 0.0–0.2)

## 2021-12-13 LAB — COMPREHENSIVE METABOLIC PANEL
ALT: 10 U/L (ref 0–44)
AST: 16 U/L (ref 15–41)
Albumin: 3.9 g/dL (ref 3.5–5.0)
Alkaline Phosphatase: 53 U/L (ref 38–126)
Anion gap: 9 (ref 5–15)
BUN: 18 mg/dL (ref 8–23)
CO2: 24 mmol/L (ref 22–32)
Calcium: 8.9 mg/dL (ref 8.9–10.3)
Chloride: 106 mmol/L (ref 98–111)
Creatinine, Ser: 1.1 mg/dL (ref 0.61–1.24)
GFR, Estimated: >60 mL/min (ref >60)
Glucose, Bld: 98 mg/dL (ref 70–99)
Potassium: 3.5 mmol/L (ref 3.5–5.1)
Sodium: 139 mmol/L (ref 135–145)
Total Bilirubin: 0.5 mg/dL (ref 0.3–1.2)
Total Protein: 7.8 g/dL (ref 6.5–8.1)

## 2021-12-13 LAB — URINALYSIS, ROUTINE W REFLEX MICROSCOPIC
Bacteria, UA: NONE SEEN
Bilirubin Urine: NEGATIVE
Glucose, UA: NEGATIVE mg/dL
Ketones, ur: NEGATIVE mg/dL
Nitrite: NEGATIVE
Protein, ur: 100 mg/dL — AB
RBC / HPF: >50 RBC/hpf — ABNORMAL HIGH (ref 0–5)
Specific Gravity, Urine: 1.015 (ref 1.005–1.030)
WBC, UA: >50 WBC/hpf — ABNORMAL HIGH (ref 0–5)
pH: 5 (ref 5.0–8.0)

## 2021-12-13 LAB — LIPASE, BLOOD: Lipase: 24 U/L (ref 11–51)

## 2021-12-13 LAB — RESP PANEL BY RT-PCR (FLU A&B, COVID) ARPGX2
Influenza A by PCR: NEGATIVE
Influenza B by PCR: NEGATIVE
SARS Coronavirus 2 by RT PCR: NEGATIVE

## 2021-12-13 LAB — POC OCCULT BLOOD, ED: Fecal Occult Bld: NEGATIVE

## 2021-12-13 MED ORDER — SODIUM CHLORIDE 0.9 % IV SOLN
10.0000 mL/h | Freq: Once | INTRAVENOUS | Status: AC
  Administered 2021-12-14: 03:00:00 10 mL/h via INTRAVENOUS

## 2021-12-13 MED ORDER — HYDROCODONE-ACETAMINOPHEN 5-325 MG PO TABS
2.0000 | ORAL_TABLET | Freq: Once | ORAL | Status: AC
  Administered 2021-12-13: 2 via ORAL
  Filled 2021-12-13: qty 2

## 2021-12-13 MED ORDER — IOHEXOL 300 MG/ML  SOLN
100.0000 mL | Freq: Once | INTRAMUSCULAR | Status: AC | PRN
  Administered 2021-12-13: 100 mL via INTRAVENOUS

## 2021-12-13 NOTE — ED Provider Notes (Signed)
[facility] DEPT Provider Note   CSN: [id number] Arrival date & time: 12/13/21  1733     History  Chief Complaint  Patient presents with   Abdominal Pain    Luis House is a 71 y.o. male.   Abdominal Pain Associated symptoms: constipation, diarrhea, fatigue and hematuria   Patient presents for hematuria, abdominal pain, and dizziness.  His medical history includes current stage IV bladder cancer, CKD, and arthritis.  Cancer history is as follows: He was diagnosed in 2019.  He underwent TURBT.  He had recurrence in 2020.  He underwent chemotherapy and chemoradiation.  He was most recently seen by oncology 2 weeks ago.  He has recently been on pembrolizumab.  Today he reports continued intermittent hematuria, lower abdominal pain, dizziness with standing, increased fatigue.  He also endorses some left chest discomfort.  He denies nausea, vomiting or dark stools.  He previously had constipation but this has been treated with a laxative medication and this morning he had diarrhea.    Home Medications Prior to Admission medications   Medication Sig Start Date End Date Taking? Authorizing Provider  bisacodyl (DULCOLAX) 5 MG EC tablet Take 5 mg by mouth daily as needed for moderate constipation.    [provider]  Carboxymethylcell-Glycerin PF 0.5-0.9 % SOLN Place 2 drops into both eyes 4 (four) times daily. 12/14/21   Brunetta Genera, MD  cholecalciferol (VITAMIN D3) 25 MCG (1000 UNIT) tablet Take 1 tablet (1,000 Units total) by mouth daily. 09/29/21   Gardenia Phlegm, NP  dexamethasone (DECADRON) 4 MG tablet Take 2 tablets (8 mg total) by mouth daily. Start the day after chemotherapy for 2 days. 12/14/21   Brunetta Genera, MD  HYDROcodone-acetaminophen [facility]) 10-325 MG tablet Take 1 tablet by mouth every 6 (six) hours as needed for moderate pain. 12/14/21   Danford, Suann Larry, MD  LORazepam (ATIVAN) 0.5 MG tablet Take 1 tablet  (0.5 mg total) by mouth every 6 (six) hours as needed (Nausea or vomiting). 12/14/21   Brunetta Genera, MD  omeprazole (PRILOSEC) 40 MG capsule Take 1 capsule (40 mg total) by mouth daily before breakfast. 11/29/21   Brunetta Genera, MD  ondansetron (ZOFRAN) 8 MG tablet Take 1 tablet (8 mg total) by mouth 2 (two) times daily as needed for refractory nausea / vomiting. Start on day 3 after chemo. 12/14/21   Brunetta Genera, MD  polyethylene glycol (MIRALAX) 17 g packet Take 17 g by mouth daily. 11/29/21   Brunetta Genera, MD  prochlorperazine (COMPAZINE) 10 MG tablet Take 1 tablet (10 mg total) by mouth every 6 (six) hours as needed (Nausea or vomiting). 12/14/21   Brunetta Genera, MD  senna-docusate (SENNA S) 8.6-50 MG tablet Take 2 tablets by mouth at bedtime as needed for mild constipation or moderate constipation. 11/29/21   Brunetta Genera, MD      Allergies    Patient has no known allergies.    Review of Systems   Review of Systems  Constitutional:  Positive for activity change and fatigue.  Gastrointestinal:  Positive for abdominal pain, constipation and diarrhea.  Genitourinary:  Positive for hematuria.  Neurological:  Positive for dizziness and light-headedness.   Physical Exam Updated Vital Signs BP 140/80 (BP Location: Left Arm)    Pulse (!) 44    Temp 98.8 F (37.1 C) (Oral)    Resp 17    Ht '5\' 7"'$  (1.702 m)    Wt 62.6  kg    SpO2 100%    BMI 21.61 kg/m  Physical Exam Vitals and nursing note reviewed.  Constitutional:      General: He is not in acute distress.    Appearance: He is well-developed. He is ill-appearing (chronically). He is not toxic-appearing or diaphoretic.  HENT:     Head: Normocephalic and atraumatic.     Mouth/Throat:     Mouth: Mucous membranes are moist.     Pharynx: Oropharynx is clear.  Eyes:     Conjunctiva/sclera: Conjunctivae normal.     Pupils: Pupils are equal, round, and reactive to light.  Cardiovascular:     Rate and  Rhythm: Normal rate and regular rhythm.     Heart sounds: No murmur heard. Pulmonary:     Effort: Pulmonary effort is normal. No respiratory distress.     Breath sounds: Normal breath sounds. No wheezing or rales.  Chest:     Chest wall: No tenderness.  Abdominal:     Palpations: Abdomen is soft.     Tenderness: There is abdominal tenderness (Tender lymphadenopathy in groin).  Musculoskeletal:        General: No swelling.     Cervical back: Neck supple.  Skin:    General: Skin is warm and dry.  Neurological:     General: No focal deficit present.     Mental Status: He is alert and oriented to person, place, and time.     Cranial Nerves: No cranial nerve deficit.     Motor: No weakness.  Psychiatric:        Mood and Affect: Mood normal.        Behavior: Behavior normal.    ED Results / Procedures / Treatments   Labs (all labs ordered are listed, but only abnormal results are displayed) Labs Reviewed  CBC - Abnormal; Notable for the following components:      Result Value   RBC 2.61 (*)    Hemoglobin 7.1 (*)    HCT 23.1 (*)    All other components within normal limits  URINALYSIS, ROUTINE W REFLEX MICROSCOPIC - Abnormal; Notable for the following components:   Color, Urine RED (*)    APPearance CLOUDY (*)    Hgb urine dipstick LARGE (*)    Protein, ur 100 (*)    Leukocytes,Ua LARGE (*)    RBC / HPF >50 (*)    WBC, UA >50 (*)    All other components within normal limits  BASIC METABOLIC PANEL - Abnormal; Notable for the following components:   Calcium 8.6 (*)    All other components within normal limits  CBC - Abnormal; Notable for the following components:   Hemoglobin 11.6 (*)    HCT 37.2 (*)    All other components within normal limits  RESP PANEL BY RT-PCR (FLU A&B, COVID) ARPGX2  URINE CULTURE  LIPASE, BLOOD  COMPREHENSIVE METABOLIC PANEL  MAGNESIUM  HEMOGLOBIN AND HEMATOCRIT, BLOOD  POC OCCULT BLOOD, ED  PREPARE RBC (CROSSMATCH)  TYPE AND SCREEN  ABO/RH   TROPONIN I (HIGH SENSITIVITY)  TROPONIN I (HIGH SENSITIVITY)    EKG EKG Interpretation  Date/Time:  Thursday December 14 2021 00:47:27 EST Ventricular Rate:  35 PR Interval:  261 QRS Duration: 112 QT Interval:  486 QTC Calculation: 371 R Axis:   -5 Text Interpretation: Mobitz II 2-degree AV block Atrial premature complex Prolonged PR interval Probable left ventricular hypertrophy Confirmed by Sherwood Gambler [phone]) on 12/15/2021 8:58:36 AM  Radiology CT ABDOMEN PELVIS W  CONTRAST  Result Date: 12/14/2021 CLINICAL DATA:  History of bladder carcinoma with hematuria, initial encounter EXAM: CT ABDOMEN AND PELVIS WITH CONTRAST TECHNIQUE: Multidetector CT imaging of the abdomen and pelvis was performed using the standard protocol following bolus administration of intravenous contrast. RADIATION DOSE REDUCTION: This exam was performed according to the departmental dose-optimization program which includes automated exposure control, adjustment of the mA and/or kV according to patient size and/or use of iterative reconstruction technique. CONTRAST:  126m OMNIPAQUE IOHEXOL 300 MG/ML  SOLN COMPARISON:  09/04/2021 PET-CT FINDINGS: Lower chest: Lung bases are free of acute infiltrate. No parenchymal nodules are seen. Hepatobiliary: No focal liver abnormality is seen. No gallstones, gallbladder wall thickening, or biliary dilatation. Pancreas: Unremarkable. No pancreatic ductal dilatation or surrounding inflammatory changes. Spleen: Normal in size without focal abnormality. Adrenals/Urinary Tract: Adrenal glands are within normal limits. Kidneys demonstrate a normal enhancement pattern bilaterally. Normal excretion is noted bilaterally. Chronic fullness of the ureters is noted bilaterally similar to that seen on prior PET-CT. The bladder is decompressed with considerable wall thickening consistent with the given clinical history. The overall appearance is stable. Stomach/Bowel: No obstructive or inflammatory  changes of colon are seen. Mild retained fecal material is noted. The appendix is air-filled and within normal limits. Small bowel and stomach are within normal limits as well. Vascular/Lymphatic: Atherosclerotic calcifications are noted of the aorta. Centrally necrotic lymph node is noted in the left inguinal region stable in size. Scattered centrally necrotic left external iliac lymph nodes are noted as well. These have increased in size and number from the prior PET-CT. A 10.5 mm node is noted best seen on image number 63 of series 2 new from the prior exam. Additionally a nodal mass is seen adjacent to the external iliac artery on image number 58 of series 2 measuring up to 14 mm in short axis. Additionally a node is noted measuring 17 mm in short axis adjacent to the aortic bifurcation not well visualized on the prior exam. No other retroperitoneal adenopathy is seen. Reproductive: Prostate is unremarkable. Other: No abdominal wall hernia or abnormality. No abdominopelvic ascites. Musculoskeletal: Degenerative changes of the hip joints are seen. Lumbar spine degenerative changes are noted as well. No lytic or sclerotic lesion is seen to suggest metastatic disease. IMPRESSION: Increase in size and number of lymphadenopathy involving the left external iliac and retroperitoneal changes as described. This is consistent with progressive metastatic disease. Bladder wall thickening consistent with the known history of bladder carcinoma is again noted. No findings to suggest hematuria are seen. No renal mass is noted. No other focal abnormality is seen. Electronically Signed   By: MInez CatalinaM.D.   On: 12/14/2021 00:09   DG Chest Portable 1 View  Result Date: 12/13/2021 CLINICAL DATA:  History of bladder cancer presenting with hematuria, abdominal pain and dizziness. EXAM: PORTABLE CHEST 1 VIEW COMPARISON:  January 18, 2020 FINDINGS: There is stable right-sided venous Port-A-Cath positioning. The heart size and  mediastinal contours are within normal limits. Both lungs are clear. A chronic deformity is seen involving the left humeral head and left glenoid. Multilevel degenerative changes seen throughout the thoracic spine. IMPRESSION: No active cardiopulmonary disease. Electronically Signed   By: TVirgina NorfolkM.D.   On: 12/13/2021 19:35    Procedures Procedures    Medications Ordered in ED Medications  HYDROcodone-acetaminophen (NORCO/VICODIN) 5-325 MG per tablet 2 tablet (2 tablets Oral Given 12/13/21 2007)  0.9 %  sodium chloride infusion (0 mL/hr Intravenous Stopped 12/14/21 1015)  iohexol (OMNIPAQUE) 300 MG/ML solution 100 mL (100 mLs Intravenous Contrast Given 12/13/21 2346)    ED Course/ Medical Decision Making/ A&P                           Medical Decision Making Amount and/or Complexity of Data Reviewed Labs: ordered. Radiology: ordered.  Risk Prescription drug management. Decision regarding hospitalization.   This patient presents to the ED for concern of abdominal pain, hematuria, and dizziness with walking, this involves an extensive number of treatment options, and is a complaint that carries with it a high risk of complications and morbidity.  The differential diagnosis includes dehydration, anemia, metabolic abnormality, infection   Co morbidities that complicate the patient evaluation  Metastatic bladder cancer, arthritis   Additional history obtained:  Additional history obtained from patient's daughter External records from outside source obtained and reviewed including EMR   Lab Tests:  I Ordered, and personally interpreted labs.  The pertinent results include: Acute drop in hemoglobin   Imaging Studies ordered:  I ordered imaging studies including chest x-ray I independently visualized and interpreted imaging which showed no acute findings I agree with the radiologist interpretation   Cardiac Monitoring:  The patient was maintained on a cardiac  monitor.  I personally viewed and interpreted the cardiac monitored which showed an underlying rhythm of: Sinus rhythm   Medicines ordered and prescription drug management:  I ordered medication including Norco for chronic pain; RBCs for acute anemia Reevaluation of the patient after these medicines showed that the patient improved I have reviewed the patients home medicines and have made adjustments as needed   Critical Interventions:  PRBC infusion for acute anemia.   Consultations Obtained:  I requested consultation with the urologist,  and discussed lab and imaging findings as well as pertinent plan - they recommend: Obtaining CT scan of abdomen and pelvis while in the ED with urology assessment tomorrow morning.   Problem List / ED Course:  Patient is a 71 year old male who currently has stage IV bladder cancer, presenting for worsening abdominal pain and inguinal lymphadenopathy, persistent intermittent hematuria, increased fatigue, as well as dizziness with standing and walking.  On arrival in the ED, he has normal vital signs.  He does appear chronically ill but is in no acute distress.  His daughter is at bedside.  Lab work was notable for a 6.5 g/dL drop in his hemoglobin over the past 2 weeks.  His urine today is a color pink lemonade and it is unlikely that he would have experienced this much blood loss from his intermittent hematuria.  On DRE, there is no evidence of gross blood Hemoccult testing was negative.  Given his recent symptoms of dizziness with standing and walking in addition to worsening fatigue, patient to be infused with PRBCs for symptomatic anemia.  He was admitted to hospitalist for further management.  Hospitalist requested urology consultation.  Urology did request a CT scan of abdomen and pelvis and stated that they would see him tomorrow morning while inpatient.  Patient was admitted for further management.    Social Determinants of Health:  Worsening  chronic illness  CRITICAL CARE Performed by: Godfrey Pick   Total critical care time: 32 minutes  Critical care time was exclusive of separately billable procedures and treating other patients.  Critical care was necessary to treat or prevent imminent or life-threatening deterioration.  Critical care was time spent personally by me on the following activities: development  of treatment plan with patient and/or surrogate as well as nursing, discussions with consultants, evaluation of patient's response to treatment, examination of patient, obtaining history from patient or surrogate, ordering and performing treatments and interventions, ordering and review of laboratory studies, ordering and review of radiographic studies, pulse oximetry and re-evaluation of patient's condition.          Final Clinical Impression(s) / ED Diagnoses Final diagnoses:  Symptomatic anemia  Gross hematuria    Rx / DC Orders ED Discharge Orders          Ordered    Increase activity slowly        12/14/21 1255    Discharge instructions       Comments: From Dr. Nelva Bush were admitted for anemia due to bleeding from your bladder cancer. This may continue, but it seems to be slow.  Call your cancer doctor, Dr. Irene Limbo at the [facility] in [place] to see if he would like to see you sooner than the PET scan on Mar 13 and the appointment on Mar 21  Call your Urologist (your bladder surgeon at [facility] in [place]), Dr. Warrick Parisian to see if he can see you next ewek  Return for dizziness, weakness so that you can't stand up, or inability to pee   12/14/21 1255    HYDROcodone-acetaminophen (NORCO) 10-325 MG tablet  Every 6 hours PRN        12/14/21 1311              Godfrey Pick, MD 12/15/21 1635

## 2021-12-13 NOTE — ED Notes (Signed)
Patient transported to CT 

## 2021-12-13 NOTE — ED Triage Notes (Signed)
Pt states he has a history of bladder cancer and is now having blood in his urine. Pt states he is also having abdominal pain and dizziness. States he has ran out of his pain medication. ?

## 2021-12-14 ENCOUNTER — Other Ambulatory Visit: Payer: Self-pay | Admitting: Hematology

## 2021-12-14 ENCOUNTER — Other Ambulatory Visit: Payer: Self-pay

## 2021-12-14 ENCOUNTER — Encounter (HOSPITAL_COMMUNITY): Payer: Self-pay | Admitting: Family Medicine

## 2021-12-14 DIAGNOSIS — D6489 Other specified anemias: Secondary | ICD-10-CM | POA: Diagnosis not present

## 2021-12-14 DIAGNOSIS — D649 Anemia, unspecified: Secondary | ICD-10-CM | POA: Diagnosis not present

## 2021-12-14 DIAGNOSIS — Z7189 Other specified counseling: Secondary | ICD-10-CM

## 2021-12-14 DIAGNOSIS — C679 Malignant neoplasm of bladder, unspecified: Secondary | ICD-10-CM

## 2021-12-14 DIAGNOSIS — R590 Localized enlarged lymph nodes: Secondary | ICD-10-CM

## 2021-12-14 DIAGNOSIS — R001 Bradycardia, unspecified: Secondary | ICD-10-CM

## 2021-12-14 LAB — BASIC METABOLIC PANEL
Anion gap: 5 (ref 5–15)
BUN: 17 mg/dL (ref 8–23)
CO2: 25 mmol/L (ref 22–32)
Calcium: 8.6 mg/dL — ABNORMAL LOW (ref 8.9–10.3)
Chloride: 105 mmol/L (ref 98–111)
Creatinine, Ser: 0.95 mg/dL (ref 0.61–1.24)
GFR, Estimated: >60 mL/min (ref >60)
Glucose, Bld: 87 mg/dL (ref 70–99)
Potassium: 3.7 mmol/L (ref 3.5–5.1)
Sodium: 135 mmol/L (ref 135–145)

## 2021-12-14 LAB — TROPONIN I (HIGH SENSITIVITY)
Troponin I (High Sensitivity): 12 ng/L (ref <18)
Troponin I (High Sensitivity): 9 ng/L (ref <18)

## 2021-12-14 LAB — MAGNESIUM: Magnesium: 2.3 mg/dL (ref 1.7–2.4)

## 2021-12-14 LAB — CBC
HCT: 37.2 % — ABNORMAL LOW (ref 39.0–52.0)
Hemoglobin: 11.6 g/dL — ABNORMAL LOW (ref 13.0–17.0)
MCH: 26.6 pg (ref 26.0–34.0)
MCHC: 31.2 g/dL (ref 30.0–36.0)
MCV: 85.3 fL (ref 80.0–100.0)
Platelets: 280 10*3/uL (ref 150–400)
RBC: 4.36 MIL/uL (ref 4.22–5.81)
RDW: 15.5 % (ref 11.5–15.5)
WBC: 8.7 10*3/uL (ref 4.0–10.5)
nRBC: 0 % (ref 0.0–0.2)

## 2021-12-14 LAB — HEMOGLOBIN AND HEMATOCRIT, BLOOD
HCT: 44.6 % (ref 39.0–52.0)
Hemoglobin: 14.7 g/dL (ref 13.0–17.0)

## 2021-12-14 LAB — PREPARE RBC (CROSSMATCH)

## 2021-12-14 LAB — ABO/RH: ABO/RH(D): O POS

## 2021-12-14 MED ORDER — ONDANSETRON HCL 8 MG PO TABS: 8.0000 mg | ORAL_TABLET | Freq: Two times a day (BID) | ORAL | 1 refills | Status: DC | PRN

## 2021-12-14 MED ORDER — DEXAMETHASONE 4 MG PO TABS: 8.0000 mg | ORAL_TABLET | Freq: Every day | ORAL | 1 refills | Status: DC

## 2021-12-14 MED ORDER — BISACODYL 5 MG PO TBEC: 5.0000 mg | DELAYED_RELEASE_TABLET | Freq: Every day | ORAL | Status: DC | PRN

## 2021-12-14 MED ORDER — HEPARIN SOD (PORK) LOCK FLUSH 100 UNIT/ML IV SOLN
500.0000 [IU] | Freq: Once | INTRAVENOUS | Status: DC
  Filled 2021-12-14: qty 5

## 2021-12-14 MED ORDER — ACETAMINOPHEN 650 MG RE SUPP: 650.0000 mg | Freq: Four times a day (QID) | RECTAL | Status: DC | PRN

## 2021-12-14 MED ORDER — PROCHLORPERAZINE MALEATE 10 MG PO TABS: 10.0000 mg | ORAL_TABLET | Freq: Four times a day (QID) | ORAL | 1 refills | Status: DC | PRN

## 2021-12-14 MED ORDER — ACETAMINOPHEN 325 MG PO TABS: 650.0000 mg | ORAL_TABLET | Freq: Four times a day (QID) | ORAL | Status: DC | PRN

## 2021-12-14 MED ORDER — HYDROCODONE-ACETAMINOPHEN 10-325 MG PO TABS: 1.0000 | ORAL_TABLET | Freq: Four times a day (QID) | ORAL | 0 refills | Status: DC | PRN

## 2021-12-14 MED ORDER — PANTOPRAZOLE SODIUM 40 MG PO TBEC
40.0000 mg | DELAYED_RELEASE_TABLET | Freq: Every day | ORAL | Status: DC
  Administered 2021-12-14: 10:00:00 40 mg via ORAL
  Filled 2021-12-14: qty 1

## 2021-12-14 MED ORDER — HYDROCODONE-ACETAMINOPHEN 10-325 MG PO TABS
1.0000 | ORAL_TABLET | Freq: Four times a day (QID) | ORAL | Status: DC | PRN
Start: 2021-12-14 — End: 2021-12-14

## 2021-12-14 MED ORDER — LACTATED RINGERS IV SOLN: INTRAVENOUS | Status: DC

## 2021-12-14 MED ORDER — LORAZEPAM 0.5 MG PO TABS: 0.5000 mg | ORAL_TABLET | Freq: Four times a day (QID) | ORAL | 0 refills | Status: DC | PRN

## 2021-12-14 MED ORDER — ONDANSETRON HCL 4 MG/2ML IJ SOLN: 4.0000 mg | Freq: Four times a day (QID) | INTRAMUSCULAR | Status: DC | PRN

## 2021-12-14 MED ORDER — SENNOSIDES-DOCUSATE SODIUM 8.6-50 MG PO TABS: 2.0000 | ORAL_TABLET | Freq: Every evening | ORAL | Status: DC | PRN

## 2021-12-14 MED ORDER — ONDANSETRON HCL 4 MG PO TABS: 4.0000 mg | ORAL_TABLET | Freq: Four times a day (QID) | ORAL | Status: DC | PRN

## 2021-12-14 MED ORDER — CARBOXYMETHYLCELL-GLYCERIN PF 0.5-0.9 % OP SOLN: 2.0000 [drp] | Freq: Four times a day (QID) | OPHTHALMIC | 11 refills | Status: DC

## 2021-12-14 NOTE — Assessment & Plan Note (Signed)
Monitor on telemetry. Has intermittent bradycardia from 38-60 that is brief. Pt is asymptomatic.  ?If bradycardia persists in 38-45 range will need dose of atropine ?

## 2021-12-14 NOTE — Care Management Important Message (Signed)
Important Message ? ?Patient Details  ?Name: Luis House ?MRN: [id number] ?Date of Birth: 06/04/1951 ? ? ?Medicare Important Message Given:  Yes ? ? ? ? ?Arlie Solomons Esgar Barnick, LCSW ?12/14/2021, 2:01 PM ?

## 2021-12-14 NOTE — Discharge Summary (Signed)
Physician Discharge Summary   Patient: Luis House MRN: [id number] DOB: 03/24/1951  Admit date:     12/13/2021  Discharge date: 12/14/21  Discharge Physician: Edwin Dada   PCP: [place]   Recommendations at discharge:  Follow up with Dr. Irene Limbo in 4 days for PET scan, then for appointment after that Follow up with Dr. Warrick Parisian for symptomatic treatment of bladders spasms       Discharge Diagnoses: Principal Problem:   Symptomatic anemia Active Problems:   Inguinal lymphadenopathy   Bradycardia   Malignant neoplasm of urinary bladder ([place])   Port-A-Cath in place        Hospital Course: Mr. Swatek is a 71 y.o. M with stage IV bladder CA, and CKD who presented with gross hematuria and lower abdominal pain.    In the ER, noted to have Hgb 7.1 from recent 13.  CT abdomen and pelvis showed an increase in size and number of lymphadenopathy involving the left external iliac but no other changes.  FOBT- and negative history for melena.  Admitted and transfused 1 unit.  Posttransfusion H&H was up to 14, which makes me wonder if his initial hemoglobin measurement of 7 was periods.  Regardless after transfusion the patient ambulated and felt at his baseline.  He had no urinary retention, and his urine was starting to clear.  Case was discussed with urology; patient has been dismissed from his local urologist, should follow-up with his [facility] urologist if he needs symptomatic treatment.  Discussed with patient's oncologist, patient is on palliative chemo and appears to have had progression.  This information was shared with family and patient, including the terminal nature of disease which the patient expressed understanding of.  Oncology will arrange close follow-up and discussion of further options.           Pain control - [employer] Controlled Substance Reporting System database was reviewed.   Consultants: Oncology   Disposition:  Home   DISCHARGE MEDICATION: Allergies as of 12/14/2021   No Known Allergies      Medication List     TAKE these medications    Carboxymethylcell-Glycerin PF 0.5-0.9 % Soln Place 2 drops into both eyes 4 (four) times daily.   cholecalciferol 25 MCG (1000 UNIT) tablet Commonly known as: VITAMIN D3 Take 1 tablet (1,000 Units total) by mouth daily.   dexamethasone 4 MG tablet Commonly known as: DECADRON Take 2 tablets (8 mg total) by mouth daily. Start the day after chemotherapy for 2 days.   Dulcolax 5 MG EC tablet Generic drug: bisacodyl Take 5 mg by mouth daily as needed for moderate constipation.   HYDROcodone-acetaminophen 10-325 MG tablet Commonly known as: NORCO Take 1 tablet by mouth every 6 (six) hours as needed for moderate pain.   LORazepam 0.5 MG tablet Commonly known as: Ativan Take 1 tablet (0.5 mg total) by mouth every 6 (six) hours as needed (Nausea or vomiting).   omeprazole 40 MG capsule Commonly known as: PRILOSEC Take 1 capsule (40 mg total) by mouth daily before breakfast.   ondansetron 8 MG tablet Commonly known as: Zofran Take 1 tablet (8 mg total) by mouth 2 (two) times daily as needed for refractory nausea / vomiting. Start on day 3 after chemo.   polyethylene glycol 17 g packet Commonly known as: MiraLax Take 17 g by mouth daily.   prochlorperazine 10 MG tablet Commonly known as: COMPAZINE Take 1 tablet (10 mg total) by mouth every 6 (six) hours as  needed (Nausea or vomiting).   senna-docusate 8.6-50 MG tablet Commonly known as: Senna S Take 2 tablets by mouth at bedtime as needed for mild constipation or moderate constipation.        Follow-up Information     Brunetta Genera, MD. Call.   Specialties: Hematology, Oncology Contact information: [place] [address] [phone]         Roxanne Mins, MD. Call.   Specialty: Urology Contact information: [place]  [address] [phone]                Discharge Instructions     Discharge instructions   Complete by: As directed    From Dr. Nelva Bush were admitted for anemia due to bleeding from your bladder cancer. This may continue, but it seems to be slow.  Call your cancer doctor, Dr. Irene Limbo at the [facility] in [place] to see if he would like to see you sooner than the PET scan on Mar 13 and the appointment on Mar 21  Call your Urologist (your bladder surgeon at [facility] in [place]), Dr. Warrick Parisian to see if he can see you next ewek  Return for dizziness, weakness so that you can't stand up, or inability to pee   Increase activity slowly   Complete by: As directed        Discharge Exam: Filed Weights   12/13/21 1742  Weight: 62.6 kg   General: Thin elderly male, sitting on the edge of the bed, eating lunch, tremor noted Cardiovascular: RRR, nl S1-S2, no murmurs appreciated.   No LE edema.   Respiratory: Normal respiratory rate and rhythm.  CTAB without rales or wheezes. Abdominal: Moderate suprapubic tenderness.  He has some left inguinal adenopathy.  No distension or HSM.   Neuro/Psych: Strength symmetric in upper and lower extremities.  Judgment and insight appear normal.   Condition at discharge: stable  The results of significant diagnostics from this hospitalization (including imaging, microbiology, ancillary and laboratory) are listed below for reference.   Imaging Studies: CT ABDOMEN PELVIS W CONTRAST  Result Date: 12/14/2021 CLINICAL DATA:  History of bladder carcinoma with hematuria, initial encounter EXAM: CT ABDOMEN AND PELVIS WITH CONTRAST TECHNIQUE: Multidetector CT imaging of the abdomen and pelvis was performed using the standard protocol following bolus administration of intravenous contrast. RADIATION DOSE REDUCTION: This exam was performed according to the departmental dose-optimization program which includes automated exposure control,  adjustment of the mA and/or kV according to patient size and/or use of iterative reconstruction technique. CONTRAST:  169m OMNIPAQUE IOHEXOL 300 MG/ML  SOLN COMPARISON:  09/04/2021 PET-CT FINDINGS: Lower chest: Lung bases are free of acute infiltrate. No parenchymal nodules are seen. Hepatobiliary: No focal liver abnormality is seen. No gallstones, gallbladder wall thickening, or biliary dilatation. Pancreas: Unremarkable. No pancreatic ductal dilatation or surrounding inflammatory changes. Spleen: Normal in size without focal abnormality. Adrenals/Urinary Tract: Adrenal glands are within normal limits. Kidneys demonstrate a normal enhancement pattern bilaterally. Normal excretion is noted bilaterally. Chronic fullness of the ureters is noted bilaterally similar to that seen on prior PET-CT. The bladder is decompressed with considerable wall thickening consistent with the given clinical history. The overall appearance is stable. Stomach/Bowel: No obstructive or inflammatory changes of colon are seen. Mild retained fecal material is noted. The appendix is air-filled and within normal limits. Small bowel and stomach are within normal limits as well. Vascular/Lymphatic: Atherosclerotic calcifications are noted of the aorta. Centrally necrotic lymph  node is noted in the left inguinal region stable in size. Scattered centrally necrotic left external iliac lymph nodes are noted as well. These have increased in size and number from the prior PET-CT. A 10.5 mm node is noted best seen on image number 63 of series 2 new from the prior exam. Additionally a nodal mass is seen adjacent to the external iliac artery on image number 58 of series 2 measuring up to 14 mm in short axis. Additionally a node is noted measuring 17 mm in short axis adjacent to the aortic bifurcation not well visualized on the prior exam. No other retroperitoneal adenopathy is seen. Reproductive: Prostate is unremarkable. Other: No abdominal wall hernia  or abnormality. No abdominopelvic ascites. Musculoskeletal: Degenerative changes of the hip joints are seen. Lumbar spine degenerative changes are noted as well. No lytic or sclerotic lesion is seen to suggest metastatic disease. IMPRESSION: Increase in size and number of lymphadenopathy involving the left external iliac and retroperitoneal changes as described. This is consistent with progressive metastatic disease. Bladder wall thickening consistent with the known history of bladder carcinoma is again noted. No findings to suggest hematuria are seen. No renal mass is noted. No other focal abnormality is seen. Electronically Signed   By: Inez Catalina M.D.   On: 12/14/2021 00:09   DG Chest Portable 1 View  Result Date: 12/13/2021 CLINICAL DATA:  History of bladder cancer presenting with hematuria, abdominal pain and dizziness. EXAM: PORTABLE CHEST 1 VIEW COMPARISON:  January 18, 2020 FINDINGS: There is stable right-sided venous Port-A-Cath positioning. The heart size and mediastinal contours are within normal limits. Both lungs are clear. A chronic deformity is seen involving the left humeral head and left glenoid. Multilevel degenerative changes seen throughout the thoracic spine. IMPRESSION: No active cardiopulmonary disease. Electronically Signed   By: Virgina Norfolk M.D.   On: 12/13/2021 19:35   [name] CORE BIOPSY (LYMPH NODES)  Result Date: 11/20/2021 INDICATION: History of urothelial carcinoma of the bladder with enlarged and hypermetabolic left inguinal lymph nodes by PET scan suspicious for recurrent metastatic disease. EXAM: ULTRASOUND GUIDED CORE BIOPSY OF LEFT INGUINAL LYMPH NODE MEDICATIONS: None. ANESTHESIA/SEDATION: Moderate (conscious) sedation was employed during this procedure. A total of Versed 1.0 mg and Fentanyl 25 mcg was administered intravenously by radiology nursing. Moderate Sedation Time: 16 minutes. The patient's level of consciousness and vital signs were monitored continuously by  radiology nursing throughout the procedure under my direct supervision. PROCEDURE: The procedure, risks, benefits, and alternatives were explained to the patient. Questions regarding the procedure were encouraged and answered. The patient understands and consents to the procedure. A time-out was performed prior to initiating the procedure. Ultrasound was performed to localize left inguinal lymph nodes. The left groin was prepped with chlorhexidine in a sterile fashion, and a sterile drape was applied covering the operative field. A sterile gown and sterile gloves were used for the procedure. Local anesthesia was provided with 1% Lidocaine. Under ultrasound guidance, a 16 gauge core biopsy device was utilized in obtaining 4 separate samples of an enlarged left inguinal lymph node. Core biopsy samples were submitted in saline. COMPLICATIONS: None immediate. FINDINGS: There are multiple adjacent enlarged lymph nodes in the left inguinal region. The largest measures approximately 2.3 cm in length and is located in the medial inguinal region corresponding to one of the hypermetabolic lymph nodes by recent PET scan. This lymph node was sampled yielding solid tissue. IMPRESSION: Ultrasound-guided core biopsy performed of an enlarged left inguinal lymph node. Electronically  Signed   By: Aletta Edouard M.D.   On: 11/20/2021 14:47    Microbiology: Results for orders placed or performed during the hospital encounter of 12/13/21  Resp Panel by RT-PCR (Flu A&B, Covid) Nasopharyngeal Swab     Status: None   Collection Time: 12/13/21  6:49 PM   Specimen: Nasopharyngeal Swab; Nasopharyngeal(NP) swabs in vial transport medium  Result Value Ref Range Status   SARS Coronavirus 2 by RT PCR NEGATIVE NEGATIVE Final    Comment: (NOTE) SARS-CoV-2 target nucleic acids are NOT DETECTED.  The SARS-CoV-2 RNA is generally detectable in upper respiratory specimens during the acute phase of infection. The lowest concentration of  SARS-CoV-2 viral copies this assay can detect is 138 copies/mL. A negative result does not preclude SARS-Cov-2 infection and should not be used as the sole basis for treatment or other patient management decisions. A negative result may occur with  improper specimen collection/handling, submission of specimen other than nasopharyngeal swab, presence of viral mutation(s) within the areas targeted by this assay, and inadequate number of viral copies(<138 copies/mL). A negative result must be combined with clinical observations, patient history, and epidemiological information. The expected result is Negative.  Fact Sheet for Patients:  EntrepreneurPulse.com.au  Fact Sheet for Healthcare Providers:  IncredibleEmployment.be  This test is no t yet approved or cleared by the [place] FDA and  has been authorized for detection and/or diagnosis of SARS-CoV-2 by FDA under an Emergency Use Authorization (EUA). This EUA will remain  in effect (meaning this test can be used) for the duration of the COVID-19 declaration under Section 564(b)(1) of the Act, 21 U.S.C.section 360bbb-3(b)(1), unless the authorization is terminated  or revoked sooner.       Influenza A by PCR NEGATIVE NEGATIVE Final   Influenza B by PCR NEGATIVE NEGATIVE Final    Comment: (NOTE) The Xpert Xpress SARS-CoV-2/FLU/RSV plus assay is intended as an aid in the diagnosis of influenza from Nasopharyngeal swab specimens and should not be used as a sole basis for treatment. Nasal washings and aspirates are unacceptable for Xpert Xpress SARS-CoV-2/FLU/RSV testing.  Fact Sheet for Patients: EntrepreneurPulse.com.au  Fact Sheet for Healthcare Providers: IncredibleEmployment.be  This test is not yet approved or cleared by the [place] FDA and has been authorized for detection and/or diagnosis of SARS-CoV-2 by FDA under an Emergency Use  Authorization (EUA). This EUA will remain in effect (meaning this test can be used) for the duration of the COVID-19 declaration under Section 564(b)(1) of the Act, 21 U.S.C. section 360bbb-3(b)(1), unless the authorization is terminated or revoked.  Performed at [facility], [place] [address], [place], [place] [address]     Labs: CBC: Recent Labs  Lab 12/13/21 1752 12/14/21 0257 12/14/21 1012  WBC 6.2 8.7  --   HGB 7.1* 11.6* 14.7  HCT 23.1* 37.2* 44.6  MCV 88.5 85.3  --   PLT 164 280  --    Basic Metabolic Panel: Recent Labs  Lab 12/13/21 1752 12/13/21 2340 12/14/21 0257  NA 139  --  135  K 3.5  --  3.7  CL 106  --  105  CO2 24  --  25  GLUCOSE 98  --  87  BUN 18  --  17  CREATININE 1.10  --  0.95  CALCIUM 8.9  --  8.6*  MG  --  2.3  --    Liver Function Tests: Recent Labs  Lab 12/13/21 1752  AST 16  ALT 10  ALKPHOS 53  BILITOT 0.5  PROT 7.8  ALBUMIN 3.9   CBG: No results for input(s): GLUCAP in the last 168 hours.  Discharge time spent: 40 minutes.  Signed: Edwin Dada, MD [facility] 12/14/2021

## 2021-12-14 NOTE — Hospital Course (Signed)
Luis House is a 71 y.o. M with stage IV bladder CA, and CKD who presented with gross hematuria and lower abdominal pain.   ? ?In the ER, noted to have Hgb 7.1 from recent 13.  CT abdomen and pelvis showed an increase in size and number of lymphadenopathy involving the left ?external iliac but no other changes.  FOBT- and negative history for melena. ? ?Admitted and transfused 1 unit. ?

## 2021-12-14 NOTE — Assessment & Plan Note (Signed)
Urology consulted to evaluate the hematuria ?

## 2021-12-14 NOTE — H&P (Signed)
?History and Physical  ? ? ?Patient: Luis House ZES:[id number] DOB: Jul 11, 1951 ?DOA: 12/13/2021 ?DOS: the patient was seen and examined on 12/14/2021 ?PCP: [facility] [place]  ?Patient coming from: Home ? ?Chief Complaint:  ?Chief Complaint  ?Patient presents with  ? Abdominal Pain  ? ?HPI: Luis House is a 71 y.o. male with medical history significant of stage IV bladder cancer, CKD, OA who presents with hematuria and lower abdominal pain. He last saw oncology two weeks ago. He previously was treated with therapy and chemoradiation.  He does have a Port-A-Cath and has recently been on pembrolizumab.  Denies any fever, nausea vomiting or diarrhea.  He has not had any chest pain or pressure.  He denies shortness of breath.  Reports he has had dark red blood when he urinates but tonight it is a pink color.  Denies any pain with urination.  He notes that he has a knot in the left inguinal region that has been present for the last few weeks but he is not sure if it is enlarged.  It is not draining anything.  He has had increasing fatigue and has had shortness of breath with exertion.  He was noted in the emergency room to have a hemoglobin of 7.1.  His hemoglobin was 13 2 weeks ago.  He denies any black or tarry stool and has not had any hematemesis.  He is Hemoccult negative in the emergency room ?Hospitalist service asked to admit for further management. ? ?Review of Systems: As mentioned in the history of present illness. All other systems reviewed and are negative. ?Past Medical History:  ?Diagnosis Date  ? Arthritis   ? wrist and knee  ? Bladder tumor   ? Cataract   ? right eye  ? Collar bone fracture 2017  ? left  ? ?Past Surgical History:  ?Procedure Laterality Date  ? MULTIPLE TOOTH EXTRACTIONS    ? TRANSURETHRAL RESECTION OF BLADDER TUMOR N/A 01/09/2019  ? Procedure: TRANSURETHRAL RESECTION OF BLADDER TUMOR (TURBT)/ POST OPERATIVE INSTILLATION OF CHEMOTHERAPY;  Surgeon: Luis Rhodes, MD;  Location:  [facility];  Service: Urology;  Laterality: N/A;  ? ?Social History:  reports that he has been smoking cigarettes. He has a 12.75 pack-year smoking history. He has never used smokeless tobacco. He reports that he does not currently use alcohol. He reports that he does not use drugs. ? ?No Known Allergies ? ?History reviewed. No pertinent family history. ? ?Prior to Admission medications   ?Medication Sig Start Date End Date Taking? Authorizing Provider  ?bisacodyl (DULCOLAX) 5 MG EC tablet Take 5 mg by mouth daily as needed for moderate constipation.    [provider]  ?cholecalciferol (VITAMIN D3) 25 MCG (1000 UNIT) tablet Take 1 tablet (1,000 Units total) by mouth daily. 09/29/21   Luis Phlegm, NP  ?HYDROcodone-acetaminophen (NORCO) 10-325 MG tablet Take 1 tablet by mouth every 6 (six) hours as needed for moderate pain. 11/29/21   Luis Genera, MD  ?omeprazole (PRILOSEC) 40 MG capsule Take 1 capsule (40 mg total) by mouth daily before breakfast. 11/29/21   Luis Genera, MD  ?polyethylene glycol (MIRALAX) 17 g packet Take 17 g by mouth daily. 11/29/21   Luis Genera, MD  ?senna-docusate (SENNA S) 8.6-50 MG tablet Take 2 tablets by mouth at bedtime as needed for mild constipation or moderate constipation. 11/29/21   Luis Genera, MD  ? ? ?Physical Exam: ?Vitals:  ? 12/13/21 2130 12/13/21 2200 12/13/21 2230 12/13/21  2305  ?BP: (!) 167/62 (!) 156/86 (!) 110/96 (!) 119/54  ?Pulse: (!) 48 74 77 69  ?Resp: '13 13  13  '$ ?Temp:      ?TempSrc:      ?SpO2: 100% 100% 100% 100%  ?Weight:      ?Height:      ? ?General: WDWN, Alert and oriented x3.  ?Eyes: EOMI, PERRL, conjunctivae pale pink.  Sclera nonicteric ?HENT:  [place]/AT, external ears normal.  Nares patent without epistasis.  Mucous membranes are moist.  ?Neck: Soft, normal range of motion, supple, no masses,  Trachea midline ?Respiratory: clear to auscultation bilaterally, no wheezing, no crackles. Normal respiratory effort. No  accessory muscle use.  ?Cardiovascular: Regular rhythm, rate variable. no murmurs / rubs / gallops. No extremity edema. 2+ pedal pulses.  ?Abdomen: Soft, no tenderness, nondistended, no rebound or guarding.  No masses palpated.  Bowel sounds normoactive. Left inguinal knot that is nontender ?Musculoskeletal: FROM. no cyanosis. No joint deformity upper and lower extremities. Normal muscle tone.  ?Skin: Warm, dry, intact no rashes ?Neurologic: CN 2-12 grossly intact.  Normal speech.  Sensation intact, Strength 5/5 in all extremities.   ?Psychiatric:  Normal mood.  ? ?Data Reviewed: ?Lab Work: WBC 6200 hemoglobin 7.1.  Hemoglobin was 13.22 weeks ago.  Hematocrit 23.1 platelet 164,000 ?Troponin 9 magnesium 2.3 sodium 139 potassium 3.5 chloride 106 bicarb 24 creatinine 1.10 BUN 18 glucose 98 LFTs normal ?Urinalysis cloudy red large blood large leukocytes negative nitrites ?COVID negative.  Influenza A and B negative.  Hemoccult negative ? ?X-ray shows right-sided Port-A-Cath in position.  Cardiac silhouette normal.  Lung fields are clear with no infiltrate or consolidation. ? ?CT abdomen pelvis shows increase in size and number of lymphadenopathy in the left external iliac and retroperitoneal regions.  Second bladder consistent with known bladder cancer but no signs of acute hematuria ? ?Assessment and Plan: ?* Symptomatic anemia ?Luis House is admitted to telemetry unit ?Transfuse PRBC one unit.  ?Monitor Hgb/Hct ?Hemoccult negative in the ER.  ?Has hematuria in ER with known bladder cancer.  ? ?Bradycardia ?Monitor on telemetry. Has intermittent bradycardia from 38-60 that is brief. Pt is asymptomatic.  ?If bradycardia persists in 38-45 range will need dose of atropine ? ?Inguinal lymphadenopathy ?Known lymphadenopathy that has increased in size.   ?Will need follow up with oncology ? ?Malignant neoplasm of urinary bladder ([facility]) ?Urology consulted to evaluate the hematuria ? ?Port-A-Cath in place ?stable ? ?Advance  Care Planning:   CODE STATUS: Full code    SCDs for DVT prophylaxis ? ?Consults: Urology ? ?Family Communication: Diagnosis and plan discussed with patient.  He verbalized understanding agrees with plan.  Further recommendation to follow as clinically indicated ? ?Author: ?Eben Burow, MD ?12/14/2021 12:51 AM ? ?For on call review [email or web address].  ?

## 2021-12-14 NOTE — ED Notes (Signed)
Pt ambulated from room to bathroom down the hall and back w/ a steady gait independently.   ?

## 2021-12-14 NOTE — Care Management CC44 (Signed)
Condition Code 44 Documentation Completed ? ?Patient Details  ?Name: Luis House ?MRN: [id number] ?Date of Birth: 05-Apr-1951 ? ? ?Condition Code 44 given:  Yes ?Patient signature on Condition Code 44 notice:  Yes ?Documentation of 2 MD's agreement:  Yes ?Code 44 added to claim:  Yes ? ? ? ?Arlie Solomons Yuya Vanwingerden, LCSW ?12/14/2021, 2:02 PM ? ?

## 2021-12-14 NOTE — Assessment & Plan Note (Signed)
Known lymphadenopathy that has increased in size.   ?Will need follow up with oncology ?

## 2021-12-14 NOTE — Progress Notes (Signed)
DISCONTINUE OFF PATHWAY REGIMEN - Bladder ? ? ?[id number]:Pembrolizumab 200 mg IV D1 q21 Days: ?  A cycle is every 21 days: ?    Pembrolizumab  ? ?**Always confirm dose/schedule in your pharmacy ordering system** ? ?REASON: Disease Progression ?PRIOR TREATMENT: Off Pathway: Pembrolizumab 200 mg IV D1 q21 Days ?TREATMENT RESPONSE: Partial Response (PR) ? ?START ON PATHWAY REGIMEN - Bladder ? ? ?  A cycle is every 28 days: ?    Enfortumab vedotin-ejfv  ? ?**Always confirm dose/schedule in your pharmacy ordering system** ? ?Patient Characteristics: ?Advanced/Metastatic Disease, First Line, Prior Platinum-Based Therapy, Relapse Within 12 Months, FGFR2/FGFR3 Mutation Negative or Unknown, Not a Candidate for PD-1/PD-L1 Inhibitor ?Therapeutic Status: Advanced/Metastatic Disease ?Line of Therapy: First Line ?Prior Platinum-Based Therapy<= Yes ?Time to Relapse: Relapse Within 12 Months ?FGFR2/FGFR3 Mutation Status: Awaiting Test Results ?PD-1/PD-L1 Inhibitor Candidate Status: Not a Candidate for PD-1/PD-L1 Inhibitor ?Intent of Therapy: ?Non-Curative / Palliative Intent, Discussed with Patient ?

## 2021-12-14 NOTE — Assessment & Plan Note (Addendum)
Luis House is admitted to telemetry unit ?Transfuse PRBC one unit.  ?Monitor Hgb/Hct ?Hemoccult negative in the ER.  ?Has hematuria in ER with known bladder cancer.  ?

## 2021-12-14 NOTE — Assessment & Plan Note (Signed)
stable °

## 2021-12-15 LAB — TYPE AND SCREEN
ABO/RH(D): O POS
Antibody Screen: NEGATIVE
Unit division: 0
Unit division: 0

## 2021-12-15 LAB — BPAM RBC
Blood Product Expiration Date: 202304052359
Blood Product Expiration Date: 202304052359
ISSUE DATE / TIME: 202303090259
ISSUE DATE / TIME: 202303090636
Unit Type and Rh: 5100
Unit Type and Rh: 5100

## 2021-12-15 LAB — URINE CULTURE: Culture: NO GROWTH

## 2021-12-18 ENCOUNTER — Ambulatory Visit (HOSPITAL_COMMUNITY)
Admission: RE | Admit: 2021-12-18 | Discharge: 2021-12-18 | Disposition: A | Discharge status: Home / Self Care | Payer: Medicare Other | Source: Ambulatory Visit | Attending: Hematology | Admitting: Hematology

## 2021-12-18 ENCOUNTER — Other Ambulatory Visit: Payer: Self-pay

## 2021-12-18 ENCOUNTER — Ambulatory Visit (HOSPITAL_COMMUNITY): Admission: RE | Admit: 2021-12-18 | Payer: Medicare Other | Source: Ambulatory Visit

## 2021-12-18 DIAGNOSIS — R918 Other nonspecific abnormal finding of lung field: Secondary | ICD-10-CM | POA: Insufficient documentation

## 2021-12-18 DIAGNOSIS — I2510 Atherosclerotic heart disease of native coronary artery without angina pectoris: Secondary | ICD-10-CM | POA: Diagnosis not present

## 2021-12-18 DIAGNOSIS — I700 Atherosclerosis of aorta: Secondary | ICD-10-CM | POA: Insufficient documentation

## 2021-12-18 DIAGNOSIS — C679 Malignant neoplasm of bladder, unspecified: Secondary | ICD-10-CM | POA: Diagnosis not present

## 2021-12-18 LAB — GLUCOSE, CAPILLARY: Glucose-Capillary: 99 mg/dL (ref 70–99)

## 2021-12-18 MED ORDER — FLUDEOXYGLUCOSE F - 18 (FDG) INJECTION
7.0000 | Freq: Once | INTRAVENOUS | Status: AC | PRN
  Administered 2021-12-18: 7.7 via INTRAVENOUS

## 2021-12-22 ENCOUNTER — Telehealth: Payer: Self-pay | Admitting: Hematology

## 2021-12-22 NOTE — Telephone Encounter (Signed)
.  Called pt per 3/13 inbasket , Patient was unavailable, a message with appt time and date was left with number on file.   ?

## 2021-12-25 ENCOUNTER — Other Ambulatory Visit: Payer: Self-pay

## 2021-12-25 DIAGNOSIS — C679 Malignant neoplasm of bladder, unspecified: Secondary | ICD-10-CM

## 2021-12-26 ENCOUNTER — Inpatient Hospital Stay: Payer: Medicare Other

## 2021-12-26 ENCOUNTER — Other Ambulatory Visit: Payer: Self-pay | Admitting: Hematology

## 2021-12-26 ENCOUNTER — Inpatient Hospital Stay: Payer: Medicare Other | Attending: Hematology | Admitting: Hematology

## 2021-12-27 ENCOUNTER — Telehealth: Payer: Self-pay | Admitting: Hematology

## 2021-12-27 NOTE — Telephone Encounter (Signed)
.  Called pt per 3/20 inbasket , Patient was unavailable, a message with appt time and date was left with number on file.   ?

## 2021-12-28 ENCOUNTER — Inpatient Hospital Stay: Payer: Medicare Other

## 2021-12-29 ENCOUNTER — Encounter (HOSPITAL_COMMUNITY): Payer: Self-pay

## 2021-12-29 MED FILL — Dexamethasone Sodium Phosphate Inj 100 MG/10ML: INTRAMUSCULAR | Qty: 1 | Status: AC

## 2022-01-01 ENCOUNTER — Inpatient Hospital Stay: Payer: Medicare Other | Admitting: Hematology

## 2022-01-01 ENCOUNTER — Inpatient Hospital Stay: Payer: Medicare Other

## 2022-01-02 ENCOUNTER — Other Ambulatory Visit: Payer: Self-pay

## 2022-01-02 DIAGNOSIS — C679 Malignant neoplasm of bladder, unspecified: Secondary | ICD-10-CM

## 2022-01-05 ENCOUNTER — Telehealth: Payer: Self-pay

## 2022-01-05 NOTE — Telephone Encounter (Signed)
I attempted to call Mr. Wishart daughter, Donella Stade, to explain Palliative Care's role and to schedule an appt for Mr. Briski. She did not answer. Will try again.  ?

## 2022-01-10 NOTE — Progress Notes (Signed)
Our office received a referral for [facility]. I attempted to call his daughter to schedule an appt, but she did not answer the phone. We reviewed his chart again and see that he has been to [facility] and is being admitted to Hospice. If we are needed in the future, we are more than happy to help.  ?

## 2022-04-03 ENCOUNTER — Encounter (HOSPITAL_COMMUNITY): Payer: Self-pay

## 2022-04-03 ENCOUNTER — Ambulatory Visit (HOSPITAL_COMMUNITY)
Admission: EM | Admit: 2022-04-03 | Discharge: 2022-04-03 | Disposition: A | Discharge status: Home / Self Care | Payer: Medicare Other | Attending: Family Medicine | Admitting: Family Medicine

## 2022-04-03 ENCOUNTER — Encounter: Payer: Self-pay | Admitting: Hematology

## 2022-04-03 DIAGNOSIS — M7989 Other specified soft tissue disorders: Secondary | ICD-10-CM | POA: Diagnosis not present

## 2022-04-04 ENCOUNTER — Ambulatory Visit (HOSPITAL_COMMUNITY)
Admission: RE | Admit: 2022-04-04 | Discharge: 2022-04-04 | Disposition: A | Discharge status: Home / Self Care | Payer: Medicare Other | Source: Ambulatory Visit | Attending: Family Medicine | Admitting: Family Medicine

## 2022-04-04 DIAGNOSIS — R609 Edema, unspecified: Secondary | ICD-10-CM

## 2022-04-04 DIAGNOSIS — R600 Localized edema: Secondary | ICD-10-CM | POA: Diagnosis present

## 2022-04-04 NOTE — Progress Notes (Signed)
Left lower extremity venous duplex completed. Refer to "CV Proc" under chart review to view preliminary results.  04/04/2022 11:17 AM Kelby Aline., MHA, RVT, RDCS, RDMS

## 2022-04-04 NOTE — ED Provider Notes (Signed)
[place]   [id number] 04/03/22 Arrival Time: 6301  ASSESSMENT & PLAN:  1. Leg swelling    Orders Placed This Encounter  Procedures   LE VENOUS    Instructions on discharge summary. To have LE U/S at 11am tomorrow in order to r/o DVT. Otherwise well. VSS.  Reviewed expectations re: course of current medical issues. Questions answered. Outlined signs and symptoms indicating need for more acute intervention. Patient verbalized understanding. After Visit Summary given.  Agrees to ED f/u should any new/worrisome symptoms develop.  SUBJECTIVE:  History from: patient and family. Link Burgeson is a 71 y.o. male who presents with complaint of LLE swelling; past 10 days; gradually worsening. No injury. With discomfort at times. No CP/SOB.  Past Medical History:  Diagnosis Date   Arthritis    wrist and knee   Bladder tumor    Cataract    right eye   Collar bone fracture 2017   left   Social History   Tobacco Use  Smoking Status Every Day   Packs/day: 0.25   Years: 51.00   Total pack years: 12.75   Types: Cigarettes  Smokeless Tobacco Never   Social History   Substance and Sexual Activity  Alcohol Use Not Currently   Comment: 2 beers per day    OBJECTIVE:  Vitals:   04/03/22 1712  BP: (!) 152/88  Pulse: 72  Resp: 16  Temp: 98.5 F (36.9 C)  TempSrc: Oral  SpO2: 100%    General appearance: alert, oriented, no acute distress Eyes: PERRLA; EOMI; conjunctivae normal Lungs: without labored respirations; speaks full sentences without difficulty; CTAB Heart: RRR Chest Wall: without tenderness to palpation Abdomen: soft, non-tender; no guarding or rebound tenderness Extremities: with left lower swelling/edema; with mild calf tenderness Skin: warm and dry; without rash or lesions Neuro: normal gait Psychological: alert and cooperative; normal mood and affect  No Known Allergies  Past Medical History:  Diagnosis Date   Arthritis    wrist  and knee   Bladder tumor    Cataract    right eye   Collar bone fracture 2017   left   Social History   Socioeconomic History   Marital status: Divorced    Spouse name: Not on file   Number of children: Not on file   Years of education: Not on file   Highest education level: Not on file  Occupational History   Not on file  Tobacco Use   Smoking status: Every Day    Packs/day: 0.25    Years: 51.00    Total pack years: 12.75    Types: Cigarettes   Smokeless tobacco: Never  Vaping Use   Vaping Use: Never used  Substance and Sexual Activity   Alcohol use: Not Currently    Comment: 2 beers per day   Drug use: Never   Sexual activity: Not on file  Other Topics Concern   Not on file  Social History Narrative   Not on file   Social Determinants of Health   Financial Resource Strain: Medium Risk (05/19/2021)   Overall Financial Resource Strain (CARDIA)    Difficulty of Paying Living Expenses: Somewhat hard  Food Insecurity: No Food Insecurity (05/19/2021)   Hunger Vital Sign    Worried About Running Out of Food in the Last Year: Never true    Ran Out of Food in the Last Year: Never true  Transportation Needs: Unmet Transportation Needs (05/19/2021)   PRAPARE - Transportation    Lack  of Transportation (Medical): Yes    Lack of Transportation (Non-Medical): Yes  Physical Activity: Not on file  Stress: Stress Concern Present (05/19/2021)   What Cheer    Feeling of Stress : To some extent  Social Connections: Moderately Isolated (05/19/2021)   Social Connection and Isolation Panel [NHANES]    Frequency of Communication with Friends and Family: More than three times a week    Frequency of Social Gatherings with Friends and Family: More than three times a week    Attends Religious Services: More than 4 times per year    Active Member of [employer] or Organizations: No    Attends Archivist Meetings: Never     Marital Status: Divorced  Human resources officer Violence: Not on file   History reviewed. No pertinent family history. Past Surgical History:  Procedure Laterality Date   MULTIPLE TOOTH EXTRACTIONS     TRANSURETHRAL RESECTION OF BLADDER TUMOR N/A 01/09/2019   Procedure: TRANSURETHRAL RESECTION OF BLADDER TUMOR (TURBT)/ POST OPERATIVE INSTILLATION OF CHEMOTHERAPY;  Surgeon: Kathie Rhodes, MD;  Location: [facility];  Service: Urology;  Laterality: N/AVanessa Kick, MD 04/04/22 [phone]

## 2022-04-30 ENCOUNTER — Encounter: Payer: Self-pay | Admitting: Hematology

## 2022-05-23 ENCOUNTER — Encounter: Payer: Self-pay | Admitting: Hematology

## 2022-06-26 ENCOUNTER — Encounter: Payer: Self-pay | Admitting: Hematology

## 2022-06-29 ENCOUNTER — Encounter: Payer: Self-pay | Admitting: Hematology

## 2022-08-08 DEATH — deceased

## 2022-09-18 ENCOUNTER — Encounter: Payer: Self-pay | Admitting: Hematology

## 2023-08-29 NOTE — Telephone Encounter (Signed)
Telephone call  

## 2024-03-09 IMAGING — PT NM PET [PERSON_NAME] (PS) SKULL BASE T - THIGH
7 series · 25 of 25 positions shown · non-contrast
Comparison: 09/04/2021

CLINICAL DATA: Subsequent treatment strategy for metastatic
urothelial carcinoma.

EXAM:
NUCLEAR MEDICINE PET SKULL BASE TO THIGH
TECHNIQUE: 7.7 mCi F-18 FDG was injected intravenously. Full-ring PET imaging
was performed from the skull base to thigh after the radiotracer. CT
data was obtained and used for attenuation correction and anatomic
localization.
Fasting blood glucose: 99 mg/dl

[Series 3: pet sk_thigh ac · axial · 5.0mm · 4.07mm/px · z∈[-1228,-352]mm · 5 of 220 slices shown]
[im 1/220]
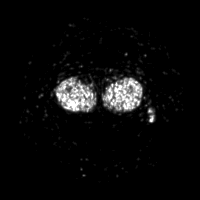
[im 55/220]
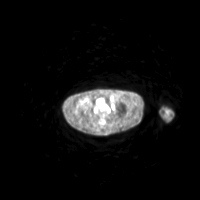
[im 110/220]
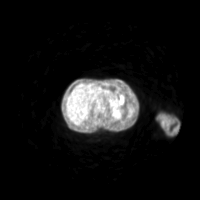
[im 165/220]
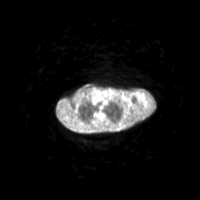
[im 220/220]
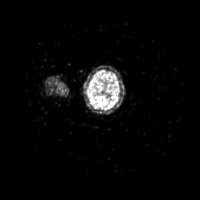

[Series 4: ct sk_thigh 5.0 bf37 · axial · 5.0mm · 0.98mm/px · z∈[-1228,-352]mm · 5 of 220 slices shown]
[im 1/220]
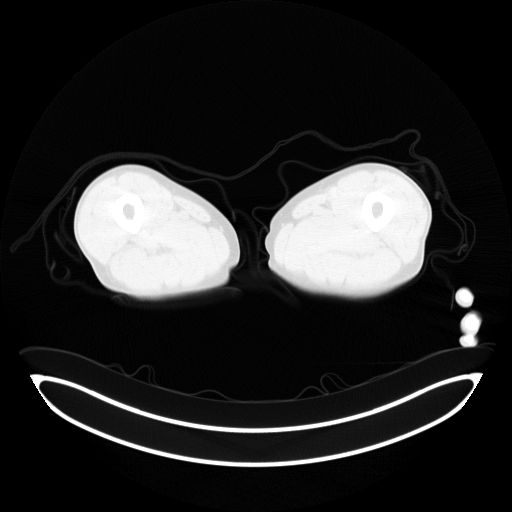
[im 55/220]
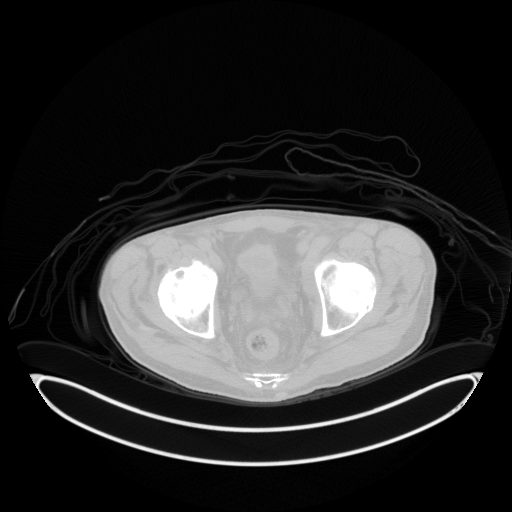
[im 110/220]
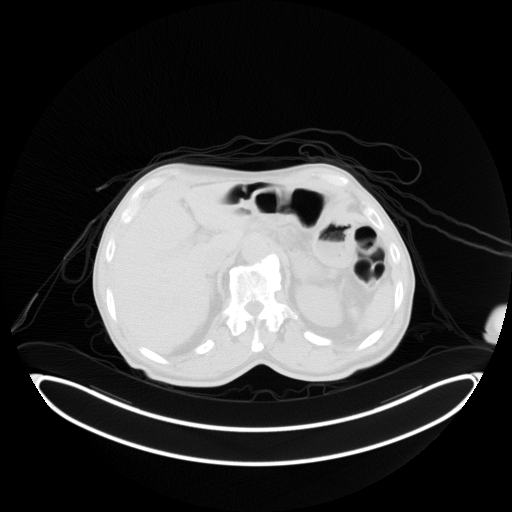
[im 165/220]
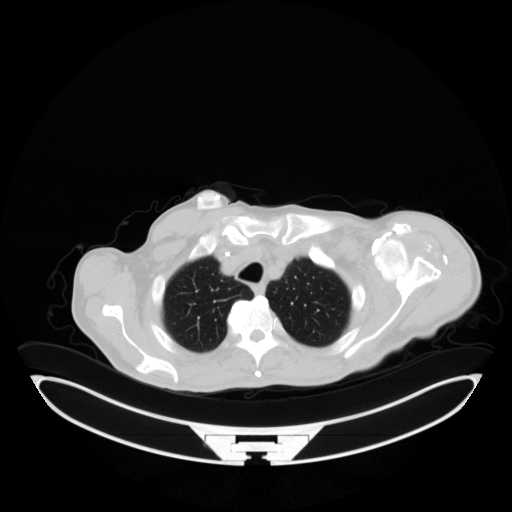
[im 220/220  brain]
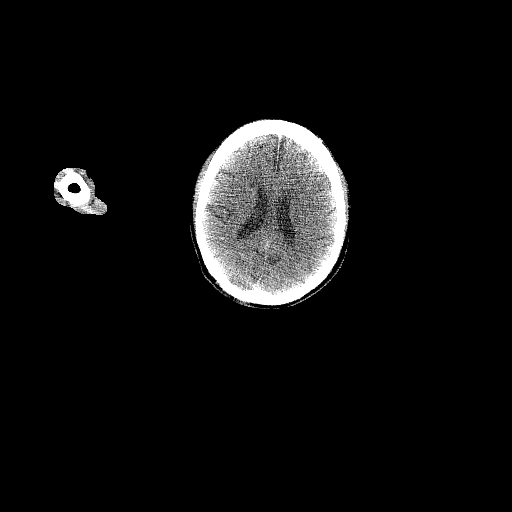

[Series 5: pet sk_thigh (person_name) · axial · 5.0mm · 4.07mm/px · z∈[-1228,-352]mm · 6 of 220 slices shown]
[im 1/220]
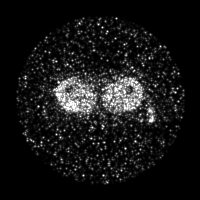
[im 44/220]
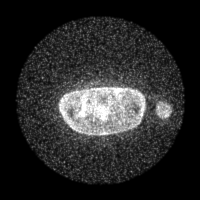
[im 88/220]
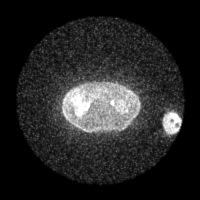
[im 132/220]
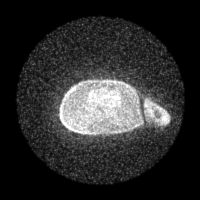
[im 176/220]
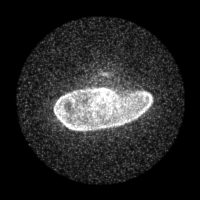
[im 220/220]
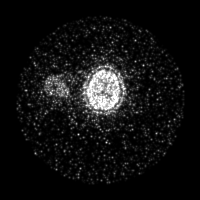

[Series 8: ct sk_thigh 5.0 br59 lung_bone · axial · 5.0mm · 0.67mm/px · z∈[-780,-512]mm · 2 of 68 slices shown]
[im 1/68]
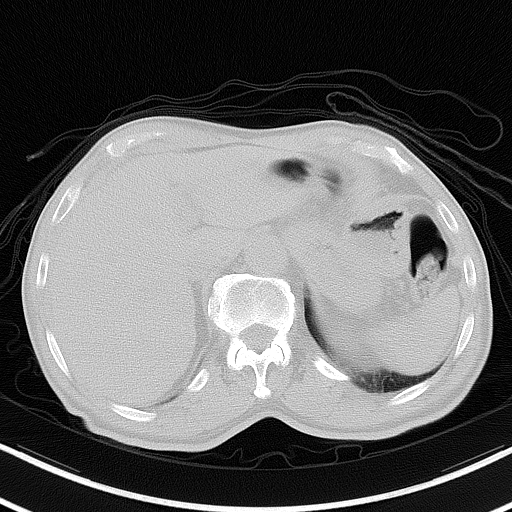
[im 68/68]
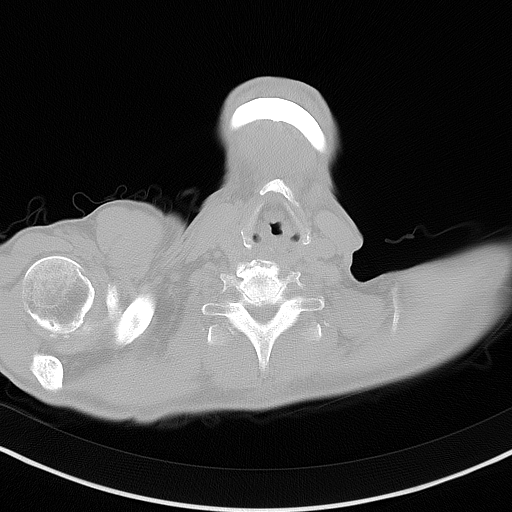

[Series 603: fused cor · 1 of 30 slices shown]
[im 1/30]
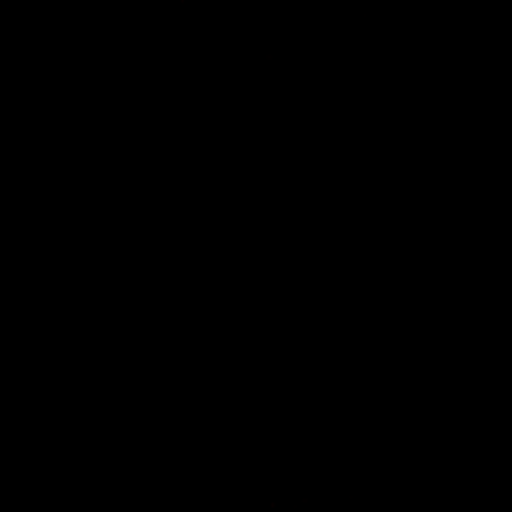

[Series 604: <mip collection> · coronal · 1.82mm/px · 1 of 24 slices shown]
[im 1/24]
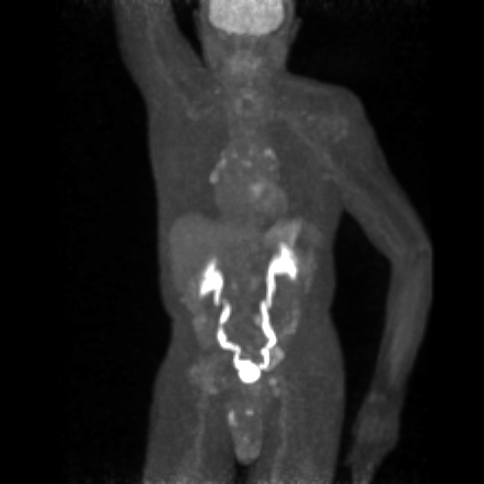

[Series 605: range-ac ct sk_thigh 5.0 (person_name)_(person_nam · 5 of 215 slices shown]
[im 1/215]
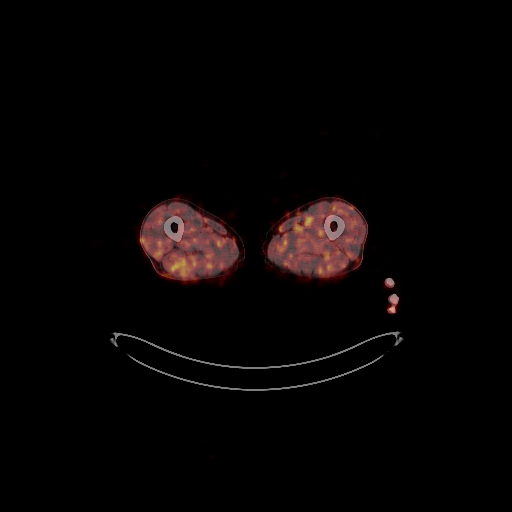
[im 54/215]
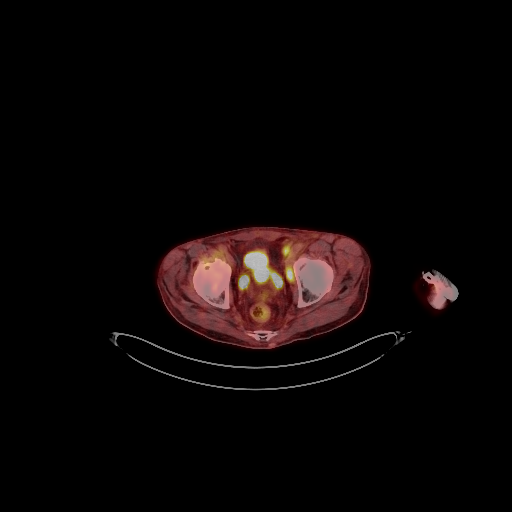
[im 108/215]
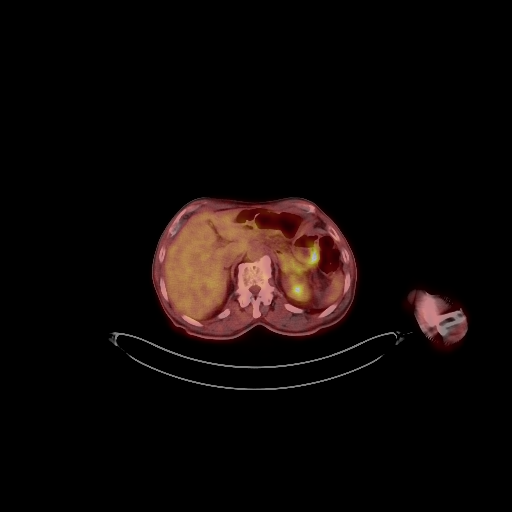
[im 161/215]
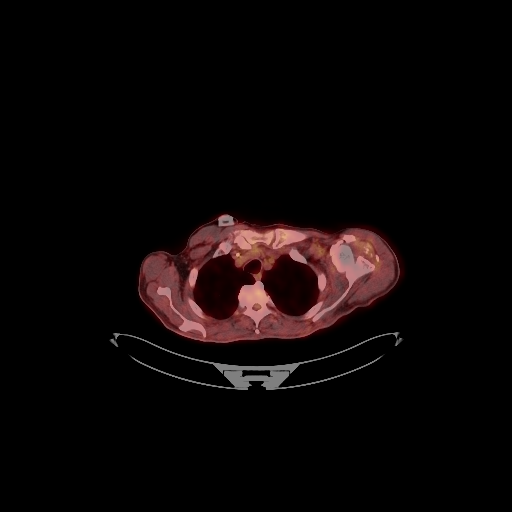
[im 215/215]
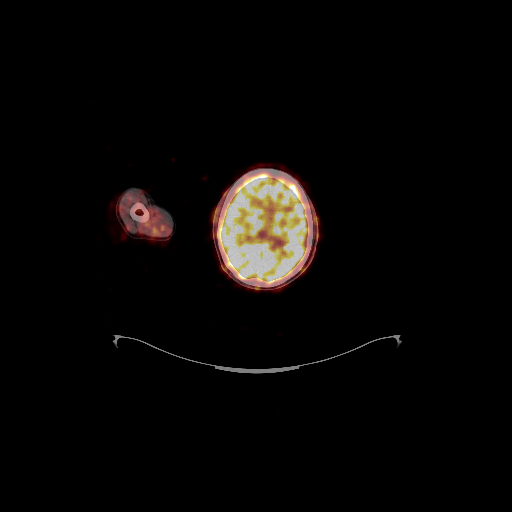

[25 of 25 positions shown; findings below may reference images not displayed]

FINDINGS: Mediastinal blood pool activity: SUV max

Liver activity: SUV max NA

NECK: No hypermetabolic lymph nodes in the neck.

Incidental CT findings: none

CHEST: FDG avid partially calcified right paratracheal lymph node
measures 1 cm with SUV max 3.92, image 72/4. Formally 1.2 cm with
SUV max of 5.7.

FDG avid left hilar lymph node has an SUV max of 6.09, image 71/4.
Formally SUV max was equal to 6.7.

Tracer avid right hilar lymph node has an SUV max of 5.5, image
81/4. Formally 5.49.

No tracer avid pulmonary nodules.

Incidental CT findings:

Unchanged appearance of too small to reliably characterize left lung
nodules, including:

Index left upper lobe lung nodule is unchanged measuring 3 mm, image
33/8.

Index perifissural nodule along the oblique fissure of the left lung
is unchanged measuring 4 mm, image 36/8.

Mild aortic atherosclerosis and coronary artery calcifications

ABDOMEN/PELVIS: There is no abnormal FDG uptake identified within
the liver, pancreas, spleen, or adrenal glands.

Left common iliac node measures 1.5 cm and has an SUV max of 7.18,
image 146/4. New from the previous exam.

Proximal left external iliac node measures 1.9 cm with SUV max of
8.06, image 160/4. Also new.

Index left external iliac lymph node measures 1.2 cm with SUV max of
6.9, image 166/4. Formally this measured 0.9 cm with SUV max of 5.2.

Distal left external iliac node measures 1.2 cm within SUV max of
7.8, image 168/4. This is new compared with the previous exam.

Left inguinal lymph node measures 1.8 cm with SUV max 6.01, image
174/4. Previously this measured 1.7 cm with SUV max of 5.8.

Incidental CT findings: Aortic atherosclerosis. Similar appearance
of anterior bladder wall thickening.

Increase in bilateral hydroureteronephrosis. This now appears
moderate compared with mild previously.

SKELETON: No focal hypermetabolic activity to suggest skeletal
metastasis.

Incidental CT findings: none
IMPRESSION: 1. Interval development of tracer avid nodal metastasis within the
left common iliac chain. Interval progression of tracer avid left
external iliac and left inguinal nodal metastasis.
2. Unchanged appearance of nonspecific tracer avid mediastinal and
hilar lymph nodes. Some of these nodes appear partially calcified
and may reflect chronic granulomatous inflammation or infection
3. No change in tiny, too small to reliably characterize, left lung
nodules.
4. Aortic Atherosclerosis ([id number]). Coronary artery
calcifications.
